# Patient Record
Sex: Male | Born: 1970 | Race: White | Hispanic: No | Marital: Single | Smoking: Current every day smoker
Health system: Southern US, Community
[De-identification: ages and names within clinical notes are randomized; demographics above are authoritative.]

## PROBLEM LIST (undated history)

## (undated) DIAGNOSIS — I10 Essential (primary) hypertension: Secondary | ICD-10-CM

## (undated) DIAGNOSIS — K219 Gastro-esophageal reflux disease without esophagitis: Secondary | ICD-10-CM

## (undated) DIAGNOSIS — R200 Anesthesia of skin: Secondary | ICD-10-CM

## (undated) DIAGNOSIS — E785 Hyperlipidemia, unspecified: Secondary | ICD-10-CM

## (undated) DIAGNOSIS — E119 Type 2 diabetes mellitus without complications: Secondary | ICD-10-CM

## (undated) DIAGNOSIS — R519 Headache, unspecified: Secondary | ICD-10-CM

## (undated) DIAGNOSIS — E079 Disorder of thyroid, unspecified: Secondary | ICD-10-CM

## (undated) DIAGNOSIS — R51 Headache: Secondary | ICD-10-CM

## (undated) DIAGNOSIS — G4730 Sleep apnea, unspecified: Secondary | ICD-10-CM

## (undated) DIAGNOSIS — F32A Depression, unspecified: Secondary | ICD-10-CM

## (undated) DIAGNOSIS — Z889 Allergy status to unspecified drugs, medicaments and biological substances status: Secondary | ICD-10-CM

## (undated) DIAGNOSIS — F329 Major depressive disorder, single episode, unspecified: Secondary | ICD-10-CM

## (undated) DIAGNOSIS — E039 Hypothyroidism, unspecified: Secondary | ICD-10-CM

## (undated) DIAGNOSIS — F4310 Post-traumatic stress disorder, unspecified: Secondary | ICD-10-CM

## (undated) DIAGNOSIS — F419 Anxiety disorder, unspecified: Secondary | ICD-10-CM

## (undated) DIAGNOSIS — R202 Paresthesia of skin: Secondary | ICD-10-CM

## (undated) DIAGNOSIS — G629 Polyneuropathy, unspecified: Secondary | ICD-10-CM

## (undated) HISTORY — PX: HERNIA REPAIR: SHX51

---

## 2016-10-12 ENCOUNTER — Encounter (HOSPITAL_COMMUNITY): Payer: Self-pay | Admitting: Emergency Medicine

## 2016-10-12 ENCOUNTER — Emergency Department (HOSPITAL_COMMUNITY)
Admission: EM | Admit: 2016-10-12 | Discharge: 2016-10-12 | Disposition: A | Discharge status: Home / Self Care | Payer: Self-pay | Attending: Emergency Medicine | Admitting: Emergency Medicine

## 2016-10-12 ENCOUNTER — Emergency Department (HOSPITAL_COMMUNITY)
Admission: EM | Admit: 2016-10-12 | Discharge: 2016-10-12 | Disposition: A | Discharge status: Other Acute Inpatient Hospital | Payer: Self-pay

## 2016-10-12 DIAGNOSIS — Z87891 Personal history of nicotine dependence: Secondary | ICD-10-CM | POA: Insufficient documentation

## 2016-10-12 DIAGNOSIS — I10 Essential (primary) hypertension: Secondary | ICD-10-CM | POA: Insufficient documentation

## 2016-10-12 DIAGNOSIS — Z0000 Encounter for general adult medical examination without abnormal findings: Secondary | ICD-10-CM | POA: Insufficient documentation

## 2016-10-12 DIAGNOSIS — E119 Type 2 diabetes mellitus without complications: Secondary | ICD-10-CM | POA: Insufficient documentation

## 2016-10-12 HISTORY — DX: Type 2 diabetes mellitus without complications: E11.9

## 2016-10-12 HISTORY — DX: Disorder of thyroid, unspecified: E07.9

## 2016-10-12 HISTORY — DX: Essential (primary) hypertension: I10

## 2016-10-12 HISTORY — DX: Hyperlipidemia, unspecified: E78.5

## 2016-10-12 HISTORY — DX: Sleep apnea, unspecified: G47.30

## 2016-10-12 NOTE — ED Triage Notes (Addendum)
Pt c/o numbness and pain from his electronic monitoring device. Device placed Thursday, pt states it's too tight and cutting off circulation. Pt states he was sent by his halfway house for assessment.

## 2016-10-12 NOTE — ED Notes (Signed)
Pt. Telling RN to take BP cuff off because he is leaving. EDP made aware. Pt. Up for d/c.

## 2016-10-12 NOTE — ED Provider Notes (Signed)
[facility] DEPT Provider Note   CSN: [id number] Arrival date & time: 10/12/16  1517     History   Chief Complaint Chief Complaint  Patient presents with  . Numbness    HPI  Blood pressure 134/89, pulse 97, temperature 98.4 F (36.9 C), temperature source Oral, resp. rate 16, height '5\' 9"'  (1.753 m), weight 86.2 kg (190 lb), SpO2 97 %.  Corey Li is a 46 y.o. male complaining ofWith past medical history significant for diabetes, hypertension and hyperlipidemia complaining of discomfort from law enforcement tracking band which is placed to left wrist. He states that he tracking then was actually met her ankle but since he's a diabetic they put it on the wrist. He states it is very uncomfortable when he is walking and when he sleeps at night and he feels that it is cutting off his circulation. He feels numbness in the hand. He states that he staying at a halfway house and he states that he was promised a Magazine features editor would be over to evaluate the band today having, and he is requesting that we either call 911 or cut the band off immediately.   Past Medical History:  Diagnosis Date  . Diabetes mellitus without complication ([place])   . Hyperlipidemia   . Hypertension   . Sleep apnea   . Thyroid disease     There are no active problems to display for this patient.   History reviewed. No pertinent surgical history.     Home Medications    Prior to Admission medications   Not on File    Family History No family history on file.  Social History Social History  Substance Use Topics  . Smoking status: Former Research scientist (life sciences)  . Smokeless tobacco: Never Used  . Alcohol use Yes     Allergies   Patient has no known allergies.   Review of Systems Review of Systems  A complete review of systems was obtained and all systems are negative except as noted in the HPI and PMH.    Physical Exam Updated Vital Signs BP 134/89 (BP Location: Right Arm)   Pulse 97    Temp 98.4 F (36.9 C) (Oral)   Resp 16   Ht '5\' 9"'  (1.753 m)   Wt 86.2 kg (190 lb)   SpO2 97%   BMI 28.06 kg/m   Physical Exam  Constitutional: He is oriented to person, place, and time. He appears well-developed and well-nourished. No distress.  HENT:  Head: Normocephalic and atraumatic.  Mouth/Throat: Oropharynx is clear and moist.  Eyes: Conjunctivae and EOM are normal. Pupils are equal, round, and reactive to light.  Neck: Normal range of motion.  Cardiovascular: Normal rate, regular rhythm and intact distal pulses.   Pulmonary/Chest: Effort normal and breath sounds normal.  Abdominal: Soft. There is no tenderness.  Musculoskeletal: Normal range of motion. He exhibits no edema, tenderness or deformity.  Electronic monitoring band with approximately 3 cm of clearance. Radial pulses 2+, there is no swelling, distal sensation is intact. Skin is intact with no breakdown underneath the wrist band.  Neurological: He is alert and oriented to person, place, and time.  Skin: He is not diaphoretic.  Psychiatric: He has a normal mood and affect.  Nursing note and vitals reviewed.          ED Treatments / Results  Labs (all labs ordered are listed, but only abnormal results are displayed) Labs Reviewed - No data to display  EKG  EKG Interpretation None  Radiology No results found.  Procedures Procedures (including critical care time)  Medications Ordered in ED Medications - No data to display   Initial Impression / Assessment and Plan / ED Course  I have reviewed the triage vital signs and the nursing notes.  Pertinent labs & imaging results that were available during my care of the patient were reviewed by me and considered in my medical decision making (see chart for details).     Vitals:   10/12/16 1525 10/12/16 1529  BP:  134/89  Pulse:  97  Resp:  16  Temp:  98.4 F (36.9 C)  TempSrc:  Oral  SpO2:  97%  Weight: 86.2 kg (190 lb)   Height: '5\' 9"'   (1.753 m)     Medications - No data to display  Corey Li is 46 y.o. male presenting with discomfort to electronic monitoring band. No signs constriction to venous or arterial flow. There is no skin breakdown full range of motion to fingers and wrist. Patient is requesting we cut it off or call 911 immediately, patient discharged in great condition.  Evaluation does not show pathology that would require ongoing emergent intervention or inpatient treatment. Pt is hemodynamically stable and mentating appropriately. Discussed findings and plan with patient/guardian, who agrees with care plan. All questions answered. Return precautions discussed and outpatient follow up given.      Final Clinical Impressions(s) / ED Diagnoses   Final diagnoses:  None    New Prescriptions New Prescriptions   No medications on file     Waynetta Pean 10/12/16 1613    Carmin Muskrat, MD 10/12/16 1700

## 2017-12-29 ENCOUNTER — Other Ambulatory Visit (HOSPITAL_BASED_OUTPATIENT_CLINIC_OR_DEPARTMENT_OTHER): Payer: Self-pay

## 2017-12-29 DIAGNOSIS — F515 Nightmare disorder: Secondary | ICD-10-CM

## 2017-12-29 DIAGNOSIS — R454 Irritability and anger: Secondary | ICD-10-CM

## 2017-12-29 DIAGNOSIS — G2581 Restless legs syndrome: Secondary | ICD-10-CM

## 2017-12-29 DIAGNOSIS — R0683 Snoring: Secondary | ICD-10-CM

## 2017-12-29 DIAGNOSIS — G4710 Hypersomnia, unspecified: Secondary | ICD-10-CM

## 2017-12-29 DIAGNOSIS — G4730 Sleep apnea, unspecified: Secondary | ICD-10-CM

## 2017-12-29 DIAGNOSIS — G4700 Insomnia, unspecified: Secondary | ICD-10-CM

## 2018-01-25 ENCOUNTER — Ambulatory Visit (HOSPITAL_BASED_OUTPATIENT_CLINIC_OR_DEPARTMENT_OTHER): Payer: Self-pay | Attending: *Deleted | Admitting: Internal Medicine

## 2018-01-25 VITALS — Ht 69.0 in | Wt 200.0 lb

## 2018-01-25 DIAGNOSIS — R0902 Hypoxemia: Secondary | ICD-10-CM | POA: Insufficient documentation

## 2018-01-25 DIAGNOSIS — G4733 Obstructive sleep apnea (adult) (pediatric): Secondary | ICD-10-CM | POA: Insufficient documentation

## 2018-01-25 DIAGNOSIS — G4700 Insomnia, unspecified: Secondary | ICD-10-CM

## 2018-01-25 DIAGNOSIS — R454 Irritability and anger: Secondary | ICD-10-CM

## 2018-01-25 DIAGNOSIS — G2581 Restless legs syndrome: Secondary | ICD-10-CM

## 2018-01-25 DIAGNOSIS — G4710 Hypersomnia, unspecified: Secondary | ICD-10-CM

## 2018-01-25 DIAGNOSIS — G4730 Sleep apnea, unspecified: Secondary | ICD-10-CM

## 2018-01-25 DIAGNOSIS — F515 Nightmare disorder: Secondary | ICD-10-CM

## 2018-01-25 DIAGNOSIS — R0683 Snoring: Secondary | ICD-10-CM

## 2018-02-07 DIAGNOSIS — G4710 Hypersomnia, unspecified: Secondary | ICD-10-CM

## 2018-02-07 NOTE — Procedures (Signed)
Patient Name: Corey Li, Corey Li Date: 01/25/2018 Gender: Male D.O.B: 02/12/1971 Age (years): 47 Referring Provider: Marliss Coots NP Height (inches): 69 Interpreting Physician: Baird Lyons MD, ABSM Weight (lbs): 200 RPSGT: Carolin Coy BMI: 30 MRN: [id number] Neck Size: 19.00  CLINICAL INFORMATION The patient is referred for a split night study with BPAP. MEDICATIONS Medications self-administered by patient taken the night of the study : none reported  SLEEP STUDY TECHNIQUE As per the AASM Manual for the Scoring of Sleep and Associated Events v2.3 (April 2016) with a hypopnea requiring 4% desaturations.  The channels recorded and monitored were frontal, central and occipital EEG, electrooculogram (EOG), submentalis EMG (chin), nasal and oral airflow, thoracic and abdominal wall motion, anterior tibialis EMG, snore microphone, electrocardiogram, and pulse oximetry. Bi-level positive airway pressure (BiPAP) was initiated when the patient met split night criteria and was titrated according to treat sleep-disordered breathing.  RESPIRATORY PARAMETERS Diagnostic  Total AHI (/hr): 60.4 RDI (/hr): 60.4 OA Index (/hr): 42.8 CA Index (/hr): 0.4 REM AHI (/hr): 13.3 NREM AHI (/hr): 63.5 Supine AHI (/hr): N/A Non-supine AHI (/hr): 60.38 Min O2 Sat (%): 51.0 Mean O2 (%): 88.5 Time below 88% (min): 59.9   Titration  Optimal IPAP Pressure (cm): 28 Optimal EPAP Pressure (cm): 23 AHI at Optimal Pressure (/hr): 32.3 Min O2 at Optimal Pressure (%): 76.0 Sleep % at Optimal (%): 76 Supine % at Optimal (%): 100   SLEEP ARCHITECTURE The study was initiated at 10:16:01 PM and terminated at 5:12:17 AM. The total recorded time was 416.3 minutes. EEG confirmed total sleep time was 331.5 minutes yielding a sleep efficiency of 79.6%%. Sleep onset after lights out was 10.8 minutes with a REM latency of 52.0 minutes. The patient spent 54.9%% of the night in stage N1 sleep, 30.8%% in stage N2 sleep,  0.0%% in stage N3 and 14.3% in REM. Wake after sleep onset (WASO) was 74.0 minutes. The Arousal Index was 48.7/hour.  LEG MOVEMENT DATA The total Periodic Limb Movements of Sleep (PLMS) were 0. The PLMS index was 0.0 . CARDIAC DATA The 2 lead EKG demonstrated sinus rhythm. The mean heart rate was 100.0 beats per minute. Other EKG findings include: PACs.  IMPRESSIONS - Severe obstructive sleep apnea occurred during the diagnostic portion of the study (AHI = 60.4 /hour).  - CPAP could not provide adequate control and titration was changed to BIPAP with final titration in available time to 28/23. Incomplete control was achieved and oxygen desaturation persisted at final presssures.  - No significant central sleep apnea occurred during the diagnostic portion of the study (CAI = 0.4/hour). - Severe oxygen desaturation was noted during the diagnostic portion of the study (Min O2 = 51.0%). Mean saturation at BIPAP 28/23 was 93.3%, but there was still desaturation at this pressure to a andir of 76%. - The patient snored with loud snoring volume during the diagnostic portion of the study. - EKG findings include PACs. - Clinically significant periodic limb movements of sleep did not occur during the study.  DIAGNOSIS - Obstructive Sleep Apnea (327.23 [G47.33 ICD-10]) - Nocturnal Hypoxemia (327.26 [G47.36 ICD-10])  RECOMMENDATIONS - Trial of BiPAP therapy on 28/23 cm H2O. Patient used a Large size Resmed Full Face Mask Mirage Quattro mask and heated humidification. - Persistent apnea events at this high pressure suggest there may be anatomic upper airway obstruction. Consider ENT evaluation. - Consider supplemental O2 during sleep for Nocturnal Hypoxemia not resolved by therapeutic BIPAP.  - Avoid alcohol, sedatives and other CNS depressants  that may worsen sleep apnea and disrupt normal sleep architecture. - Sleep hygiene should be reviewed to assess factors that may improve sleep quality. - Weight  management and regular exercise should be initiated or continued.  [Electronically signed] 02/07/2018 11:56 AM  Baird Lyons MD, ABSM Diplomate, American Board of Sleep Medicine   NPI: [id number]                          [place] [place] [facility]  ELECTRONICALLY SIGNED ON:  02/07/2018, 11:56 AM [address] PH: [phone]   FX: [phone] [place]

## 2018-05-07 ENCOUNTER — Emergency Department (HOSPITAL_COMMUNITY): Payer: Worker's Compensation

## 2018-05-07 ENCOUNTER — Encounter (HOSPITAL_COMMUNITY): Payer: Self-pay | Admitting: *Deleted

## 2018-05-07 ENCOUNTER — Emergency Department (HOSPITAL_COMMUNITY)
Admission: EM | Admit: 2018-05-07 | Discharge: 2018-05-07 | Disposition: A | Discharge status: Home / Self Care | Payer: Worker's Compensation | Attending: Emergency Medicine | Admitting: Emergency Medicine

## 2018-05-07 DIAGNOSIS — S060X0A Concussion without loss of consciousness, initial encounter: Secondary | ICD-10-CM | POA: Insufficient documentation

## 2018-05-07 DIAGNOSIS — Y990 Civilian activity done for income or pay: Secondary | ICD-10-CM | POA: Insufficient documentation

## 2018-05-07 DIAGNOSIS — I10 Essential (primary) hypertension: Secondary | ICD-10-CM | POA: Insufficient documentation

## 2018-05-07 DIAGNOSIS — E119 Type 2 diabetes mellitus without complications: Secondary | ICD-10-CM | POA: Insufficient documentation

## 2018-05-07 DIAGNOSIS — Y9389 Activity, other specified: Secondary | ICD-10-CM | POA: Diagnosis not present

## 2018-05-07 DIAGNOSIS — R51 Headache: Secondary | ICD-10-CM | POA: Diagnosis not present

## 2018-05-07 DIAGNOSIS — M542 Cervicalgia: Secondary | ICD-10-CM | POA: Insufficient documentation

## 2018-05-07 DIAGNOSIS — S0181XA Laceration without foreign body of other part of head, initial encounter: Secondary | ICD-10-CM | POA: Diagnosis present

## 2018-05-07 DIAGNOSIS — Z87891 Personal history of nicotine dependence: Secondary | ICD-10-CM | POA: Insufficient documentation

## 2018-05-07 DIAGNOSIS — M545 Low back pain: Secondary | ICD-10-CM | POA: Diagnosis not present

## 2018-05-07 DIAGNOSIS — Y9289 Other specified places as the place of occurrence of the external cause: Secondary | ICD-10-CM | POA: Insufficient documentation

## 2018-05-07 DIAGNOSIS — Z23 Encounter for immunization: Secondary | ICD-10-CM | POA: Diagnosis not present

## 2018-05-07 DIAGNOSIS — E079 Disorder of thyroid, unspecified: Secondary | ICD-10-CM | POA: Diagnosis not present

## 2018-05-07 DIAGNOSIS — W208XXA Other cause of strike by thrown, projected or falling object, initial encounter: Secondary | ICD-10-CM | POA: Insufficient documentation

## 2018-05-07 LAB — CBG MONITORING, ED: Glucose-Capillary: 446 mg/dL — ABNORMAL HIGH (ref 70–99)

## 2018-05-07 MED ORDER — HYDROCODONE-ACETAMINOPHEN 5-325 MG PO TABS
1.0000 | ORAL_TABLET | Freq: Once | ORAL | Status: AC
  Administered 2018-05-07: 1 via ORAL
  Filled 2018-05-07: qty 1

## 2018-05-07 MED ORDER — DIAZEPAM 5 MG PO TABS: 5.0000 mg | ORAL_TABLET | Freq: Two times a day (BID) | ORAL | 0 refills | Status: AC

## 2018-05-07 MED ORDER — NAPROXEN 500 MG PO TABS: 500.0000 mg | ORAL_TABLET | Freq: Two times a day (BID) | ORAL | 0 refills | Status: AC

## 2018-05-07 MED ORDER — TETANUS-DIPHTH-ACELL PERTUSSIS 5-2.5-18.5 LF-MCG/0.5 IM SUSP
0.5000 mL | Freq: Once | INTRAMUSCULAR | Status: AC
  Administered 2018-05-07: 0.5 mL via INTRAMUSCULAR
  Filled 2018-05-07: qty 0.5

## 2018-05-07 NOTE — ED Provider Notes (Signed)
[facility] EMERGENCY DEPARTMENT Provider Note   CSN: [id number] Arrival date & time: 05/07/18  1735     History   Chief Complaint Chief Complaint  Patient presents with  . Head Laceration    HPI Corey Li is a 48 y.o. male.  Pt presents to the ED with a headache, forehead laceration, neck and low back.  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  A large plastic pipe was about 20 feet above him, it was loose and fell down onto pt's head.       Past Medical History:  Diagnosis Date  . Diabetes mellitus without complication (H[facility])   . Hyperlipidemia   . Hypertension   . Sleep apnea   . Thyroid disease     There are no active problems to display for this patient.   History reviewed. No pertinent surgical history.      Home Medications    Prior to Admission medications   Not on File    Family History No family history on file.  Social History Social History   Tobacco Use  . Smoking status: Former Games developer  . Smokeless tobacco: Never Used  Substance Use Topics  . Alcohol use: Yes  . Drug use: No     Allergies   Patient has no known allergies.   Review of Systems Review of Systems  Musculoskeletal: Positive for back pain and neck pain.  Skin: Positive for wound.  Neurological: Positive for headaches.  All other systems reviewed and are negative.    Physical Exam Updated Vital Signs BP (!) 178/100 (BP Location: Right Arm)   Pulse 67   Temp 98.2 F (36.8 C) (Oral)   Resp 20   SpO2 98%   Physical Exam Vitals signs and nursing note reviewed.  Constitutional:      Appearance: Normal appearance.  HENT:     Head:      Right Ear: External ear normal.     Left Ear: External ear normal.     Nose: Nose normal.     Mouth/Throat:     Mouth: Mucous membranes are moist.     Pharynx: Oropharynx is clear.  Eyes:     Extraocular Movements: Extraocular movements intact.     Pupils: Pupils are equal,  round, and reactive to light.  Neck:   Cardiovascular:     Rate and Rhythm: Normal rate and regular rhythm.     Pulses: Normal pulses.     Heart sounds: Normal heart sounds.  Pulmonary:     Effort: Pulmonary effort is normal.     Breath sounds: Normal breath sounds.  Abdominal:     General: Abdomen is flat. Bowel sounds are normal.     Palpations: Abdomen is soft.  Musculoskeletal:       Arms:  Skin:    Capillary Refill: Capillary refill takes less than 2 seconds.  Neurological:     General: No focal deficit present.     Mental Status: He is alert and oriented to person, place, and time.  Psychiatric:        Mood and Affect: Mood normal.        Behavior: Behavior normal.        Thought Content: Thought content normal.        Judgment: Judgment normal.      ED Treatments / Results  Labs (all labs ordered are listed, but only abnormal results are displayed) Labs Reviewed - No data  to display  EKG None  Radiology No results found.  Procedures .Marland KitchenLaceration Repair Date/Time: 05/07/2018 6:44 PM Performed by: Jacalyn Lefevre, MD Authorized by: Jacalyn Lefevre, MD   Consent:    Consent obtained:  Verbal   Consent given by:  Patient   Risks discussed:  Need for additional repair and poor cosmetic result   Alternatives discussed:  No treatment Anesthesia (see MAR for exact dosages):    Anesthesia method:  None Laceration details:    Location:  Face   Face location:  Forehead   Length (cm):  4 Repair type:    Repair type:  Simple Treatment:    Area cleansed with:  Saline   Amount of cleaning:  Standard   Irrigation solution:  Sterile saline Skin repair:    Repair method:  Tissue adhesive Approximation:    Approximation:  Close Post-procedure details:    Dressing:  Open (no dressing)   Patient tolerance of procedure:  Tolerated well, no immediate complications   (including critical care time)  Medications Ordered in ED Medications    HYDROcodone-acetaminophen (NORCO/VICODIN) 5-325 MG per tablet 1 tablet (1 tablet Oral Given 05/07/18 1757)  Tdap (BOOSTRIX) injection 0.5 mL (0.5 mLs Intramuscular Given 05/07/18 1758)     Initial Impression / Assessment and Plan / ED Course  I have reviewed the triage vital signs and the nursing notes.  Pertinent labs & imaging results that were available during my care of the patient were reviewed by me and considered in my medical decision making (see chart for details).     Pt's CT scans and Xrays are pending.  Pt is signed out to PA Soto at shift change.  Final Clinical Impressions(s) / ED Diagnoses   Final diagnoses:  Laceration of forehead, initial encounter  Concussion without loss of consciousness, initial encounter    ED Discharge Orders    None       Jacalyn Lefevre, MD 05/08/18 1101

## 2018-05-07 NOTE — ED Notes (Signed)
ED Provider at bedside applying dermabond 

## 2018-05-07 NOTE — ED Notes (Signed)
CBG: 446 °

## 2018-05-07 NOTE — ED Triage Notes (Signed)
Pt reports a large piece of plastic hit his head approx 2 hours ago. Laceration to the left forehead. Reports "seeing black" states that he had some blurred vision as well.

## 2018-05-07 NOTE — ED Provider Notes (Addendum)
  Physical Exam  BP (!) 178/100 (BP Location: Right Arm)   Pulse 67   Temp 98.2 F (36.8 C) (Oral)   Resp 20   SpO2 98%   Physical Exam  ED Course/Procedures     Procedures  MDM  Care received from Dr. Shirlee Limerick at sign out, see her note for full HPI.  Briefly patient with head laceration along with blurred vision pending CT head and neck.  7:47 PM CT head and neck were negative for any acute abnormality, hemorrhage, other acute process.  At this time will reassess patient prior to discharge with Valium to help with his symptoms.  Informed of his results on CT scan, will send home with Valium along with naproxen to help with his symptoms.  Ports understanding and agrees with management.  Return precautions provided.   9:32 PM reported he was still confused, we provided him with water along with food his blood sugar was checked at 446, denies any dizziness but reports he still feeling very confused.  At this time after monitor him for 2 hours after plan discharge patient is ambulatory in hallway asking for discharge.  Will provide him with papers for discharge.  He is trying to have staff call him a cab, case management consulted.     Claude Manges, PA-C 05/07/18 1953    Claude Manges, PA-C 05/07/18 2133    Jacalyn Lefevre, MD 05/08/18 1105

## 2018-05-07 NOTE — Discharge Instructions (Addendum)
Please have your sutures removed within 7 days your primary care physician or return to the ED.  I have provided medication to help with your symptoms please take as directed.  Follow-up with your primary care physician is encouraged within 1 week.

## 2018-05-07 NOTE — ED Notes (Signed)
Provided taxi voucher to get patient safely to pharmacy and home.

## 2018-05-07 NOTE — Progress Notes (Signed)
CSW received a call from pt's RN that pt needs a taxi voucher.  CSW is not on [facility] campus and cannot provide one and suggested seeking a voucher if needed from the [facility].  CSW will continue to follow for D/C needs.  Dorothe Pea. Zuma Hust, LCSW, LCAS, CSI Clinical Social Worker Ph: [phone]

## 2018-06-09 ENCOUNTER — Encounter (HOSPITAL_COMMUNITY): Payer: Self-pay | Admitting: *Deleted

## 2018-06-09 ENCOUNTER — Emergency Department (HOSPITAL_COMMUNITY)
Admission: EM | Admit: 2018-06-09 | Discharge: 2018-06-09 | Disposition: A | Discharge status: Home / Self Care | Payer: No Typology Code available for payment source | Attending: Emergency Medicine | Admitting: Emergency Medicine

## 2018-06-09 ENCOUNTER — Emergency Department (HOSPITAL_COMMUNITY)
Admission: EM | Admit: 2018-06-09 | Discharge: 2018-06-09 | Discharge status: Against Medical Advice | Payer: No Typology Code available for payment source | Source: Home / Self Care

## 2018-06-09 ENCOUNTER — Emergency Department (HOSPITAL_COMMUNITY): Payer: No Typology Code available for payment source

## 2018-06-09 ENCOUNTER — Other Ambulatory Visit: Payer: Self-pay

## 2018-06-09 DIAGNOSIS — Y9301 Activity, walking, marching and hiking: Secondary | ICD-10-CM | POA: Diagnosis not present

## 2018-06-09 DIAGNOSIS — Y999 Unspecified external cause status: Secondary | ICD-10-CM | POA: Insufficient documentation

## 2018-06-09 DIAGNOSIS — W458XXA Other foreign body or object entering through skin, initial encounter: Secondary | ICD-10-CM | POA: Insufficient documentation

## 2018-06-09 DIAGNOSIS — R208 Other disturbances of skin sensation: Secondary | ICD-10-CM | POA: Insufficient documentation

## 2018-06-09 DIAGNOSIS — Z79899 Other long term (current) drug therapy: Secondary | ICD-10-CM | POA: Insufficient documentation

## 2018-06-09 DIAGNOSIS — I10 Essential (primary) hypertension: Secondary | ICD-10-CM | POA: Diagnosis not present

## 2018-06-09 DIAGNOSIS — Z794 Long term (current) use of insulin: Secondary | ICD-10-CM | POA: Diagnosis not present

## 2018-06-09 DIAGNOSIS — S90851A Superficial foreign body, right foot, initial encounter: Secondary | ICD-10-CM | POA: Diagnosis not present

## 2018-06-09 DIAGNOSIS — E119 Type 2 diabetes mellitus without complications: Secondary | ICD-10-CM | POA: Diagnosis not present

## 2018-06-09 DIAGNOSIS — Z87891 Personal history of nicotine dependence: Secondary | ICD-10-CM | POA: Diagnosis not present

## 2018-06-09 DIAGNOSIS — Y929 Unspecified place or not applicable: Secondary | ICD-10-CM | POA: Diagnosis not present

## 2018-06-09 NOTE — ED Notes (Signed)
Discharge instructions discussed with Pt. Pt verbalized understanding. Pt stable and ambulatory.    

## 2018-06-09 NOTE — ED Notes (Signed)
Patient transported to x-ray. ?

## 2018-06-09 NOTE — ED Notes (Signed)
Patient refused to accept offered wheelchair upon discharge (see note - refused to sign or have repeat VS done) and demanded to see MD again because he "wasn't going to leave until whatever was in his foot was taken out" - MD went in to speak with patient and less than 30 seconds after MD left room, patient heard yelling "help" - upon staff entering room, patient found to be sitting on floor. Patient told that he was offered wheelchair, was being discharged, and had already spoken with MD regarding this. No obvious injuries noted, patient in no apparent distress, but still demanding for his foot to be looked at again. MD and EDP state patient may be discharged and instructed to take OTC meclizine d/t new complaint (to EDP) of "feeling off balance" - assisted to ED lobby in wheelchair where he states he is just going to check in again. Charge RN and MD aware.

## 2018-06-09 NOTE — ED Notes (Signed)
Pt declining d/c VS and to sign the signature pad.

## 2018-06-09 NOTE — ED Notes (Signed)
Patient ambulatory to room with steady gait.

## 2018-06-09 NOTE — Discharge Instructions (Addendum)
No evidence of retained foreign body on x-ray.  Return for worsening pain, redness, warmth of the bottom of the foot or for any concerns.

## 2018-06-09 NOTE — ED Triage Notes (Signed)
PT stepped on a needle at home . Pt reports the needle broke off in his Rt heel.

## 2018-06-09 NOTE — ED Provider Notes (Signed)
[facility] EMERGENCY DEPARTMENT Provider Note   CSN: [id number] Arrival date & time: 06/09/18  1359    History   Chief Complaint Chief Complaint  Patient presents with  . Foot Pain    HPI Corey Li is a 48 y.o. male.     HPI Patient is concerned he may have stepped on a sewing needle yesterday.  States he was walking barefoot in the carpet and thinks he sustained a puncture to the right foot.  States he saw a needle that was broken in the carpet.  He is concerned he may have retained a portion of the needle in his foot.  States he has some discomfort around the plantar surface just anterior to the heel.  No swelling or redness. Past Medical History:  Diagnosis Date  . Diabetes mellitus without complication (HCC)   . Hyperlipidemia   . Hypertension   . Sleep apnea   . Thyroid disease     There are no active problems to display for this patient.   History reviewed. No pertinent surgical history.      Home Medications    Prior to Admission medications   Medication Sig Start Date End Date Taking? Authorizing Provider  ATORVASTATIN CALCIUM PO Take 1 tablet by mouth daily.    [provider]  FLUoxetine HCl (PROZAC PO) Take 1 tablet by mouth daily.    [provider]  GABAPENTIN, ONCE-DAILY, PO Take 1 tablet by mouth daily.    [provider]  INSULIN NPH, HUMAN,, ISOPHANE, [place] Inject into the skin 2 (two) times daily.    [provider]  insulin regular (NOVOLIN R,HUMULIN R) 100 units/mL injection Inject into the skin 2 (two) times daily.    [provider]  levothyroxine (SYNTHROID, LEVOTHROID) 175 MCG tablet Take 175 mcg by mouth daily before breakfast.    [provider]  metFORMIN (GLUCOPHAGE) 1000 MG tablet Take 1,000 mg by mouth 2 (two) times daily with a meal.    [provider]  OMEPRAZOLE PO Take 1 capsule by mouth daily.    [provider]  PRESCRIPTION MEDICATION Take 1  tablet by mouth daily. High Blood Pressure    [provider]    Family History History reviewed. No pertinent family history.  Social History Social History   Tobacco Use  . Smoking status: Former Games developer  . Smokeless tobacco: Never Used  Substance Use Topics  . Alcohol use: Yes  . Drug use: No     Allergies   Patient has no known allergies.   Review of Systems Review of Systems  Constitutional: Negative for chills and fever.  Musculoskeletal: Negative for arthralgias.  Skin: Negative for rash and wound.  Neurological: Negative for weakness and numbness.  All other systems reviewed and are negative.    Physical Exam Updated Vital Signs BP (!) 161/105 (BP Location: Right Arm)   Pulse 78   Temp 98.4 F (36.9 C) (Oral)   Resp 18   Ht 5\' 9"  (1.753 m)   Wt 86.2 kg   SpO2 98%   BMI 28.06 kg/m   Physical Exam Vitals signs and nursing note reviewed.  Constitutional:      Appearance: Normal appearance. He is well-developed.  HENT:     Head: Normocephalic and atraumatic.  Neck:     Musculoskeletal: Neck supple.  Cardiovascular:     Rate and Rhythm: Normal rate.  Pulmonary:     Effort: Pulmonary effort is normal.  Abdominal:  Palpations: Abdomen is soft.  Musculoskeletal: Normal range of motion.        General: No swelling, tenderness, deformity or signs of injury.     Comments: No obvious wound or foreign body to the plantar surface of the right foot.  Patient has mild tenderness to palpation over the soft tissue just anterior to the calcaneus.  No erythema or warmth.  Skin:    General: Skin is warm and dry.     Findings: No erythema or rash.  Neurological:     General: No focal deficit present.     Mental Status: He is alert and oriented to person, place, and time.  Psychiatric:        Mood and Affect: Mood normal.        Behavior: Behavior normal.      ED Treatments / Results  Labs (all labs ordered are listed, but only abnormal results  are displayed) Labs Reviewed - No data to display  EKG None  Radiology Dg Foot Complete Right  Result Date: 06/09/2018 CLINICAL DATA:  Right foot pain. The patient stepped on a sewing needle 06/06/2018. EXAM: RIGHT FOOT COMPLETE - 3+ VIEW COMPARISON:  None. FINDINGS: There is no evidence of fracture or dislocation. There is no evidence of arthropathy or other focal bone abnormality. Soft tissues are unremarkable. No radiopaque foreign body is identified. IMPRESSION: Negative exam.  No foreign body is seen. Electronically Signed   By: Drusilla Kanner M.D.   On: 06/09/2018 15:23    Procedures Procedures (including critical care time)  Medications Ordered in ED Medications - No data to display   Initial Impression / Assessment and Plan / ED Course  I have reviewed the triage vital signs and the nursing notes.  Pertinent labs & imaging results that were available during my care of the patient were reviewed by me and considered in my medical decision making (see chart for details).        No obvious signs of retained foreign bodies.  Will get x-ray to rule out.  X-ray without signs of foreign body.  Advised to follow-up with podiatry if his symptoms persist.  Strict return precautions given. Final Clinical Impressions(s) / ED Diagnoses   Final diagnoses:  Sensation of foreign body in foot    ED Discharge Orders    None       Loren Racer, MD 06/09/18 [phone]

## 2018-07-08 ENCOUNTER — Other Ambulatory Visit: Payer: Self-pay | Admitting: Orthopedic Surgery

## 2018-07-10 NOTE — Pre-Procedure Instructions (Signed)
Corey Li  07/10/2018      [id number] - [place], [place] - [address] [place] [address] Phone: [phone] Fax: [phone]  [facility] - [place], [place] - [address] [place] [address] Phone: [phone] Fax: [phone]    Your procedure is scheduled on Wed., July 15, 2018 from 11:34AM-2:05PM  [facility]ance [facility] at 9:30AM  Call this number if you have problems the morning of surgery:  [phone]   Remember:  Do not eat or drink after midnight on March 31st    Take these medicines the morning of surgery with A SIP OF WATER: AmLODipine (NORVASC), FLUoxetine (PROZAC), Atorvastatin (LIPITOR), Gabapentin (NEURONTIN),  Levothyroxine (SYNTHROID, LEVOTHROID), and Omeprazole (PRILOSEC)   If needed: HYDROcodone-acetaminophen (NORCO/VICODIN)    As of today, stop taking all Other Aspirin Products, Vitamins, Fish oils, and Herbal medications. Also stop all NSAIDS i.e. Advil, Ibuprofen, Motrin, Aleve, Anaprox, Naproxen, BC, Goody Powders, and all Supplements.   . Do not take MetFORMIN (GLUCOPHAGE) the morning of surgery.  . THE NIGHT BEFORE SURGERY, take _____10______ units of ___NPH Novolin N________insulin.   . THE MORNING OF SURGERY, take ______15_______ units of ____NPH Novolin N______insulin.  How to Manage Your Diabetes Before and After Surgery  Why is it important to control my blood sugar before and after surgery? . Improving blood sugar levels before and after surgery helps healing and can limit problems. . A way of improving blood sugar control is eating a healthy diet by: o  Eating less sugar and carbohydrates o  Increasing activity/exercise o  Talking with your doctor about reaching your blood sugar goals . High blood sugars (greater than 180 mg/dL) can raise your risk of infections and slow your recovery, so you will need to focus on controlling  your diabetes during the weeks before surgery. . Make sure that the doctor who takes care of your diabetes knows about your planned surgery including the date and location.  How do I manage my blood sugar before surgery? . Check your blood sugar at least 4 times a day, starting 2 days before surgery, to make sure that the level is not too high or low. o Check your blood sugar the morning of your surgery when you wake up and every 2 hours until you get to the Short Stay unit. . If your blood sugar is less than 70 mg/dL, you will need to treat for low blood sugar: o Do not take insulin. o Treat a low blood sugar (less than 70 mg/dL) with  cup of clear juice (cranberry or apple), 4 glucose tablets, OR glucose gel. Recheck blood sugar in 15 minutes after treatment (to make sure it is greater than 70 mg/dL). If your blood sugar is not greater than 70 mg/dL on recheck, call [phone] o  for further instructions.                                    . If your CBG is greater than 220 mg/dL, you may take  of your sliding scale (correction) dose Novolin R (1-5 units) of insulin.  . If you are admitted to the hospital after surgery: o Your blood sugar will be checked by the staff and you will probably be given insulin after surgery (instead of oral diabetes medicines) to make sure you have good blood sugar  levels. o The goal for blood sugar control after surgery is 80-180 mg/dL.  Reviewed and Endorsed by [facility] Patient Education Committee, August 2015  Special instructions:   [facility]- Preparing For Surgery  Before surgery, you can play an important role. Because skin is not sterile, your skin needs to be as free of germs as possible. You can reduce the number of germs on your skin by washing with CHG (chlorahexidine gluconate) Soap before surgery.  CHG is an antiseptic cleaner which kills germs and bonds with the skin to continue killing germs even after washing.    Oral Hygiene is also  important to reduce your risk of infection.  Remember - BRUSH YOUR TEETH THE MORNING OF SURGERY WITH YOUR REGULAR TOOTHPASTE  Please do not use if you have an allergy to CHG or antibacterial soaps. If your skin becomes reddened/irritated stop using the CHG.  Do not shave (including legs and underarms) for at least 48 hours prior to first CHG shower. It is OK to shave your face.  Please follow these instructions carefully.   1. Shower the NIGHT BEFORE SURGERY and the MORNING OF SURGERY with CHG.   2. If you chose to wash your hair, wash your hair first as usual with your normal shampoo.  3. After you shampoo, rinse your hair and body thoroughly to remove the shampoo.  4. Use CHG as you would any other liquid soap. You can apply CHG directly to the skin and wash gently with a scrungie or a clean washcloth.   5. Apply the CHG Soap to your body ONLY FROM THE NECK DOWN.  Do not use on open wounds or open sores. Avoid contact with your eyes, ears, mouth and genitals (private parts). Wash Face and genitals (private parts)  with your normal soap.  6. Wash thoroughly, paying special attention to the area where your surgery will be performed.  7. Thoroughly rinse your body with warm water from the neck down.  8. DO NOT shower/wash with your normal soap after using and rinsing off the CHG Soap.  9. Pat yourself dry with a CLEAN TOWEL.  10. Wear CLEAN PAJAMAS to bed the night before surgery, wear comfortable clothes the morning of surgery  11. Place CLEAN SHEETS on your bed the night of your first shower and DO NOT SLEEP WITH PETS.  Day of Surgery:   Do not wear jewelry.  Do not wear lotions, powders, colognes, or deodorant.  Do not shave 48 hours prior to surgery.  Men may shave face.  Do not bring valuables to the hospital.  [facility] is not responsible for any belongings or valuables.  Contacts, dentures or bridgework may not be worn into surgery.  Leave your suitcase in the car.  After  surgery it may be brought to your room.  For patients admitted to the hospital, discharge time will be determined by your treatment team.  Patients discharged the day of surgery will not be allowed to drive home.   Do not apply any deodorants/lotions.  Please wear clean clothes to the hospital/surgery center.   Remember to brush your teeth WITH YOUR REGULAR TOOTHPASTE.  Please read over the following fact sheets that you were given. Pain Booklet, Coughing and Deep Breathing, MRSA Information and Surgical Site Infection Prevention

## 2018-07-13 ENCOUNTER — Encounter (HOSPITAL_COMMUNITY): Payer: Self-pay

## 2018-07-13 ENCOUNTER — Other Ambulatory Visit: Payer: Self-pay | Admitting: Orthopedic Surgery

## 2018-07-13 ENCOUNTER — Other Ambulatory Visit: Payer: Self-pay

## 2018-07-13 ENCOUNTER — Encounter (HOSPITAL_COMMUNITY)
Admission: RE | Admit: 2018-07-13 | Discharge: 2018-07-13 | Disposition: A | Discharge status: Home / Self Care | Payer: Worker's Compensation | Source: Ambulatory Visit | Attending: Orthopedic Surgery | Admitting: Orthopedic Surgery

## 2018-07-13 DIAGNOSIS — Z01812 Encounter for preprocedural laboratory examination: Secondary | ICD-10-CM | POA: Insufficient documentation

## 2018-07-13 HISTORY — DX: Hypothyroidism, unspecified: E03.9

## 2018-07-13 HISTORY — DX: Major depressive disorder, single episode, unspecified: F32.9

## 2018-07-13 HISTORY — DX: Headache: R51

## 2018-07-13 HISTORY — DX: Gastro-esophageal reflux disease without esophagitis: K21.9

## 2018-07-13 HISTORY — DX: Paresthesia of skin: R20.2

## 2018-07-13 HISTORY — DX: Post-traumatic stress disorder, unspecified: F43.10

## 2018-07-13 HISTORY — DX: Anxiety disorder, unspecified: F41.9

## 2018-07-13 HISTORY — DX: Allergy status to unspecified drugs, medicaments and biological substances: Z88.9

## 2018-07-13 HISTORY — DX: Polyneuropathy, unspecified: G62.9

## 2018-07-13 HISTORY — DX: Anesthesia of skin: R20.0

## 2018-07-13 HISTORY — DX: Depression, unspecified: F32.A

## 2018-07-13 HISTORY — DX: Headache, unspecified: R51.9

## 2018-07-13 LAB — CBC
HCT: 37.3 % — ABNORMAL LOW (ref 39.0–52.0)
Hemoglobin: 12.3 g/dL — ABNORMAL LOW (ref 13.0–17.0)
MCH: 30.6 pg (ref 26.0–34.0)
MCHC: 33 g/dL (ref 30.0–36.0)
MCV: 92.8 fL (ref 80.0–100.0)
Platelets: 322 10*3/uL (ref 150–400)
RBC: 4.02 MIL/uL — ABNORMAL LOW (ref 4.22–5.81)
RDW: 15.3 % (ref 11.5–15.5)
WBC: 9 10*3/uL (ref 4.0–10.5)
nRBC: 0 % (ref 0.0–0.2)

## 2018-07-13 LAB — BASIC METABOLIC PANEL
Anion gap: 9 (ref 5–15)
BUN: 10 mg/dL (ref 6–20)
CO2: 31 mmol/L (ref 22–32)
CREATININE: 1.46 mg/dL — AB (ref 0.61–1.24)
Calcium: 9 mg/dL (ref 8.9–10.3)
Chloride: 94 mmol/L — ABNORMAL LOW (ref 98–111)
GFR calc Af Amer: >60 mL/min (ref >60)
GFR calc non Af Amer: 56 mL/min — ABNORMAL LOW (ref >60)
Glucose, Bld: 263 mg/dL — ABNORMAL HIGH (ref 70–99)
Potassium: 3.8 mmol/L (ref 3.5–5.1)
Sodium: 134 mmol/L — ABNORMAL LOW (ref 135–145)

## 2018-07-13 LAB — ABO/RH: ABO/RH(D): O POS

## 2018-07-13 LAB — SURGICAL PCR SCREEN
MRSA, PCR: NEGATIVE
Staphylococcus aureus: NEGATIVE

## 2018-07-13 LAB — TYPE AND SCREEN
ABO/RH(D): O POS
Antibody Screen: NEGATIVE

## 2018-07-13 LAB — HEMOGLOBIN A1C
Hgb A1c MFr Bld: 10.9 % — ABNORMAL HIGH (ref 4.8–5.6)
Mean Plasma Glucose: 266.13 mg/dL

## 2018-07-13 LAB — GLUCOSE, CAPILLARY: GLUCOSE-CAPILLARY: 145 mg/dL — AB (ref 70–99)

## 2018-07-13 NOTE — Anesthesia Preprocedure Evaluation (Addendum)
Anesthesia Evaluation  Patient identified by MRN, date of birth, ID band Patient awake    Reviewed: Allergy & Precautions, NPO status , Patient's Chart, lab work & pertinent test results  Airway Mallampati: II  TM Distance: >3 FB     Dental   Pulmonary Current Smoker,    breath sounds clear to auscultation       Cardiovascular hypertension,  Rhythm:Regular Rate:Normal     Neuro/Psych    GI/Hepatic   Endo/Other  diabetes  Renal/GU      Musculoskeletal   Abdominal   Peds  Hematology   Anesthesia Other Findings   Reproductive/Obstetrics                            Anesthesia Physical Anesthesia Plan  ASA: III  Anesthesia Plan: General   Post-op Pain Management:    Induction: Intravenous  PONV Risk Score and Plan: 1 and Ondansetron, Midazolam and Treatment may vary due to age or medical condition  Airway Management Planned: Oral ETT  Additional Equipment:   Intra-op Plan:   Post-operative Plan: Extubation in OR  Informed Consent: I have reviewed the patients History and Physical, chart, labs and discussed the procedure including the risks, benefits and alternatives for the proposed anesthesia with the patient or authorized representative who has indicated his/her understanding and acceptance.     Dental advisory given  Plan Discussed with:   Anesthesia Plan Comments:      Anesthesia Quick Evaluation

## 2018-07-13 NOTE — Pre-Procedure Instructions (Addendum)
Corey Li  07/13/2018      Your procedure is scheduled on Wed., July 15, 2018   Report to [facility] Entrance "A" at  8:30 AM              Your surgery or procedure is scheduled for 11:30  Call this number if you have problems the morning of surgery: [phone]  This is the number for the Pre- Surgical Desk.    Remember:  Do not eat or drink after midnight on March 31st    Take these medicines the morning of surgery with A SIP OF WATER: AmLODipine (NORVASC),  FLUoxetine (PROZAC), torvastatin (LIPITOR),  Gabapentin (NEURONTIN),  Levothyroxine (SYNTHROID)  Omeprazole (PRILOSEC)   If needed: HYDROcodone-acetaminophen (NORCO/VICODIN)    As of today, stop taking all Other Aspirin Products, Vitamins, Fish oils, and Herbal medications. Also stop all NSAIDS i.e. Advil, Ibuprofen, Motrin, Aleve, Anaprox, Naproxen, BC, Goody Powders, and all Supplements.   . Do not take MetFORMIN (GLUCOPHAGE) the morning of surgery.  . THE NIGHT BEFORE SURGERY, take __10_ units of NPH Novolin Insulin.   . THE MORNING OF SURGERY, take _15_ units of NPH Novolin insulin. - If CBG is greater than 70.  How to Manage Your Diabetes Before and After Surgery  Why is it important to control my blood sugar before and after surgery? . Improving blood sugar levels before and after surgery helps healing and can limit problems. . A way of improving blood sugar control is eating a healthy diet by: o  Eating less sugar and carbohydrates o  Increasing activity/exercise o  Talking with your doctor about reaching your blood sugar goals . High blood sugars (greater than 180 mg/dL) can raise your risk of infections and slow your recovery, so you will need to focus on controlling your diabetes during the weeks before surgery. . Make sure that the doctor who takes care of your diabetes knows about your planned surgery including the date and location.  How do I manage my blood sugar before surgery? . Check  your blood sugar at least 4 times a day, starting 2 days before surgery, to make sure that the level is not too high or low. o Check your blood sugar the morning of your surgery when you wake up and every 2 hours until you get to the Short Stay unit. . If your blood sugar is less than 70 mg/dL, you will need to treat for low blood sugar: o Do not take insulin. o Treat a low blood sugar (less than 70 mg/dL) with  cup of clear juice (cranberry or apple), 4 glucose tablets, OR glucose gel. Recheck blood sugar in 15 minutes after treatment (to make sure it is greater than 70 mg/dL). If your blood sugar is not greater than 70 mg/dL on recheck, call [phone]  for further instructions.                                    . If your CBG is greater than 220 mg/dL, you may take  of your sliding scale (correction) dose Novolin R (1-5 units) of insulin.  . If you are admitted to the hospital after surgery: o Your blood sugar will be checked by the staff and you will probably be given insulin after surgery (instead of oral diabetes medicines) to make sure you have good blood sugar levels. o The goal for blood sugar  control after surgery is 80-180 mg/dL.  Special instructions:   [facility]- Preparing For Surgery  Before surgery, you can play an important role. Because skin is not sterile, your skin needs to be as free of germs as possible. You can reduce the number of germs on your skin by washing with CHG (chlorahexidine gluconate) Soap before surgery.  CHG is an antiseptic cleaner which kills germs and bonds with the skin to continue killing germs even after washing.    Oral Hygiene is also important to reduce your risk of infection.  Remember - BRUSH YOUR TEETH THE MORNING OF SURGERY WITH YOUR REGULAR TOOTHPASTE  Please do not use if you have an allergy to CHG or antibacterial soaps. If your skin becomes reddened/irritated stop using the CHG.  Do not shave (including legs and underarms) for at least  48 hours prior to first CHG shower. It is OK to shave your face.  Please follow these instructions carefully.   1. Shower the NIGHT BEFORE SURGERY and the MORNING OF SURGERY with CHG.   2. If you chose to wash your hair, wash your hair first as usual with your normal shampoo.  3. After you shampoo,wash your face and private area with the soap you use at home, then rinse your hair and body thoroughly to remove the shampoo and soap.  4. Use CHG as you would any other liquid soap. You can apply CHG directly to the skin and wash gently with a scrungie or a clean washcloth.   5. Apply the CHG Soap to your body ONLY FROM THE NECK DOWN.  Do not use on open wounds or open sores. Avoid contact with your eyes, ears, mouth and genitals (private parts).   6. Wash thoroughly, paying special attention to the area where your surgery will be performed.  7. Thoroughly rinse your body with warm water from the neck down.  8. DO NOT shower/wash with your normal soap after using and rinsing off the CHG Soap.  9. Pat yourself dry with a CLEAN TOWEL.  10. Wear CLEAN PAJAMAS to bed the night before surgery, wear comfortable clothes the morning of surgery  11. Place CLEAN SHEETS on your bed the night of your first shower and DO NOT SLEEP WITH PETS.  Day of Surgery: Shower as written above   Do not wear jewelry.  Do not wear lotions, powders, colognes, or deodorant.  Do not shave 48 hours prior to surgery.  Men may shave face.  Do not bring valuables to the hospital.  [facility] is not responsible for any belongings or valuables.  Contacts, dentures or bridgework may not be worn into surgery.  Leave your suitcase in the car.  After surgery it may be brought to your room.  For patients admitted to the hospital, discharge time will be determined by your treatment team.  Patients discharged the day of surgery will not be allowed to drive home.   Do not apply any deodorants/lotions.  Please wear clean  clothes to the hospital/surgery center.   Remember to brush your teeth WITH YOUR REGULAR TOOTHPASTE.  Please read over the following fact sheets that you were given: Pain Booklet, Incentive Spirometry, Surgical Site Infections.

## 2018-07-13 NOTE — Progress Notes (Signed)
PCP - [name] NP, [facility]  Cardiologist - no  Chest x-ray - na  EKG - dos  Stress Test - no  ECHO - no  Cardiac Cath - no   Sleep Study - 12/2017 CPAP - yes  LABS- CBC, BMP, T/S, A1C  ASA-no  HA1C-10.9 Fasting Blood Sugar - 120-200 Checks Blood Sugar __2___ times a day  Anesthesia-A1c - EKG will be done the day of surgery  Pt denies having chest pain, sob, or fever at this time. All instructions explained to the pt, with a verbal understanding of the material. Pt agrees to go over the instructions while at home for a better understanding. The opportunity to ask questions was provided.  Patient denies any S/S of corona 19; patient states that he is staying away from people.  Mr Manchego reports that he has Medical Advance Directives, he does not know where the papers are.  Frederik Schmidt, patient's  emergency contact is POA. Ms Carlynn Purl lives out of [place].

## 2018-07-13 NOTE — Progress Notes (Signed)
I called Mr. Corey Li after PAT appointment and he reported that he did have labs and an EKG,  At [facility] , he thinks with in the last  6 months. I called [employer] and left a message on the operator line and Medical Tech voice mail.  I spoke with patient's case worker for [employer], Corey Li and asked if patient can get transportation back to the hospital tomorrow for an EKG.  Ms Zachery Dauer will arrange transportation after clearing with the adjustor.

## 2018-07-14 ENCOUNTER — Other Ambulatory Visit: Payer: Self-pay

## 2018-07-14 ENCOUNTER — Encounter (HOSPITAL_COMMUNITY)
Admission: RE | Admit: 2018-07-14 | Discharge: 2018-07-14 | Disposition: A | Discharge status: Home / Self Care | Payer: Worker's Compensation | Source: Ambulatory Visit | Attending: Orthopedic Surgery | Admitting: Orthopedic Surgery

## 2018-07-14 DIAGNOSIS — Z01818 Encounter for other preprocedural examination: Secondary | ICD-10-CM | POA: Insufficient documentation

## 2018-07-14 DIAGNOSIS — E118 Type 2 diabetes mellitus with unspecified complications: Secondary | ICD-10-CM

## 2018-07-14 DIAGNOSIS — Z01812 Encounter for preprocedural laboratory examination: Secondary | ICD-10-CM | POA: Diagnosis not present

## 2018-07-14 DIAGNOSIS — I10 Essential (primary) hypertension: Secondary | ICD-10-CM

## 2018-07-14 NOTE — Progress Notes (Addendum)
Epimenio Sarin , Mr [facility] case manager informed me that she has the officer on Mr Springmeyer case.  Ms Zachery Dauer said she will fax me the officer's name and number, in case it has to be removed. I have not received the information, I called and left a message on Ms Nix voice mail.  I spoke to Mee Hives, NP with [facility], she said that the center has moved and that they did not have a secure way to send correspondence, but if we have any questions to call her back.

## 2018-07-15 ENCOUNTER — Encounter (HOSPITAL_COMMUNITY)
Admission: AD | Disposition: A | Discharge status: Home Health | Payer: Self-pay | Source: Home / Self Care | Attending: Orthopedic Surgery

## 2018-07-15 ENCOUNTER — Ambulatory Visit (HOSPITAL_COMMUNITY): Payer: Worker's Compensation | Admitting: Physician Assistant

## 2018-07-15 ENCOUNTER — Inpatient Hospital Stay (HOSPITAL_COMMUNITY)
Admission: AD | Admit: 2018-07-15 | Discharge: 2018-07-18 | DRG: 454 | Disposition: A | Discharge status: Home Health | Payer: Worker's Compensation | Attending: Orthopedic Surgery | Admitting: Orthopedic Surgery

## 2018-07-15 ENCOUNTER — Ambulatory Visit (HOSPITAL_COMMUNITY): Payer: Worker's Compensation

## 2018-07-15 ENCOUNTER — Other Ambulatory Visit: Payer: Self-pay

## 2018-07-15 ENCOUNTER — Encounter (HOSPITAL_COMMUNITY): Payer: Self-pay

## 2018-07-15 DIAGNOSIS — M50123 Cervical disc disorder at C6-C7 level with radiculopathy: Principal | ICD-10-CM | POA: Diagnosis present

## 2018-07-15 DIAGNOSIS — F419 Anxiety disorder, unspecified: Secondary | ICD-10-CM | POA: Diagnosis present

## 2018-07-15 DIAGNOSIS — K219 Gastro-esophageal reflux disease without esophagitis: Secondary | ICD-10-CM | POA: Diagnosis present

## 2018-07-15 DIAGNOSIS — E785 Hyperlipidemia, unspecified: Secondary | ICD-10-CM | POA: Diagnosis present

## 2018-07-15 DIAGNOSIS — I10 Essential (primary) hypertension: Secondary | ICD-10-CM | POA: Diagnosis present

## 2018-07-15 DIAGNOSIS — M25512 Pain in left shoulder: Secondary | ICD-10-CM | POA: Diagnosis present

## 2018-07-15 DIAGNOSIS — Z01818 Encounter for other preprocedural examination: Secondary | ICD-10-CM

## 2018-07-15 DIAGNOSIS — G4730 Sleep apnea, unspecified: Secondary | ICD-10-CM | POA: Diagnosis present

## 2018-07-15 DIAGNOSIS — E039 Hypothyroidism, unspecified: Secondary | ICD-10-CM | POA: Diagnosis present

## 2018-07-15 DIAGNOSIS — Z79890 Hormone replacement therapy (postmenopausal): Secondary | ICD-10-CM

## 2018-07-15 DIAGNOSIS — F4310 Post-traumatic stress disorder, unspecified: Secondary | ICD-10-CM | POA: Diagnosis present

## 2018-07-15 DIAGNOSIS — Z419 Encounter for procedure for purposes other than remedying health state, unspecified: Secondary | ICD-10-CM

## 2018-07-15 DIAGNOSIS — F17210 Nicotine dependence, cigarettes, uncomplicated: Secondary | ICD-10-CM | POA: Diagnosis present

## 2018-07-15 DIAGNOSIS — Z794 Long term (current) use of insulin: Secondary | ICD-10-CM

## 2018-07-15 DIAGNOSIS — E1140 Type 2 diabetes mellitus with diabetic neuropathy, unspecified: Secondary | ICD-10-CM | POA: Diagnosis present

## 2018-07-15 DIAGNOSIS — G959 Disease of spinal cord, unspecified: Secondary | ICD-10-CM | POA: Diagnosis present

## 2018-07-15 DIAGNOSIS — G9520 Unspecified cord compression: Secondary | ICD-10-CM | POA: Diagnosis present

## 2018-07-15 DIAGNOSIS — Z79899 Other long term (current) drug therapy: Secondary | ICD-10-CM

## 2018-07-15 DIAGNOSIS — M5410 Radiculopathy, site unspecified: Secondary | ICD-10-CM | POA: Diagnosis present

## 2018-07-15 HISTORY — PX: ANTERIOR CERVICAL DECOMP/DISCECTOMY FUSION: SHX1161

## 2018-07-15 LAB — URINALYSIS, ROUTINE W REFLEX MICROSCOPIC
Bilirubin Urine: NEGATIVE
Glucose, UA: NEGATIVE mg/dL
Hgb urine dipstick: NEGATIVE
Ketones, ur: NEGATIVE mg/dL
Leukocytes,Ua: NEGATIVE
Nitrite: NEGATIVE
Protein, ur: NEGATIVE mg/dL
Specific Gravity, Urine: 1.003 — ABNORMAL LOW (ref 1.005–1.030)
pH: 7 (ref 5.0–8.0)

## 2018-07-15 LAB — GLUCOSE, CAPILLARY
Glucose-Capillary: 63 mg/dL — ABNORMAL LOW (ref 70–99)
Glucose-Capillary: 88 mg/dL (ref 70–99)
Glucose-Capillary: 66 mg/dL — ABNORMAL LOW (ref 70–99)
Glucose-Capillary: 90 mg/dL (ref 70–99)
Glucose-Capillary: 75 mg/dL (ref 70–99)
Glucose-Capillary: 105 mg/dL — ABNORMAL HIGH (ref 70–99)
Glucose-Capillary: 277 mg/dL — ABNORMAL HIGH (ref 70–99)

## 2018-07-15 SURGERY — ANTERIOR CERVICAL DECOMPRESSION/DISCECTOMY FUSION 2 LEVELS
Anesthesia: General | Site: Neck

## 2018-07-15 MED ORDER — DIAZEPAM 5 MG PO TABS
5.0000 mg | ORAL_TABLET | Freq: Four times a day (QID) | ORAL | Status: DC | PRN
  Administered 2018-07-16 – 2018-07-18 (×6): 5 mg via ORAL
  Filled 2018-07-15 (×6): qty 1

## 2018-07-15 MED ORDER — PROPOFOL 10 MG/ML IV BOLUS
INTRAVENOUS | Status: AC
  Filled 2018-07-15: qty 40

## 2018-07-15 MED ORDER — PROMETHAZINE HCL 25 MG/ML IJ SOLN: 6.2500 mg | INTRAMUSCULAR | Status: DC | PRN

## 2018-07-15 MED ORDER — FENTANYL CITRATE (PF) 100 MCG/2ML IJ SOLN
INTRAMUSCULAR | Status: DC | PRN
  Administered 2018-07-15 (×4): 50 ug via INTRAVENOUS

## 2018-07-15 MED ORDER — ALUM & MAG HYDROXIDE-SIMETH 200-200-20 MG/5ML PO SUSP: 30.0000 mL | Freq: Four times a day (QID) | ORAL | Status: DC | PRN

## 2018-07-15 MED ORDER — PHENYLEPHRINE HCL 10 MG/ML IJ SOLN
INTRAMUSCULAR | Status: DC | PRN
  Administered 2018-07-15: 40 ug via INTRAVENOUS
  Administered 2018-07-15 (×2): 80 ug via INTRAVENOUS
  Administered 2018-07-15: 40 ug via INTRAVENOUS

## 2018-07-15 MED ORDER — SENNOSIDES-DOCUSATE SODIUM 8.6-50 MG PO TABS: 1.0000 | ORAL_TABLET | Freq: Every evening | ORAL | Status: DC | PRN

## 2018-07-15 MED ORDER — CEFAZOLIN SODIUM-DEXTROSE 2-4 GM/100ML-% IV SOLN
2.0000 g | Freq: Three times a day (TID) | INTRAVENOUS | Status: AC
  Administered 2018-07-15 – 2018-07-16 (×2): 2 g via INTRAVENOUS
  Filled 2018-07-15 (×2): qty 100

## 2018-07-15 MED ORDER — LACTATED RINGERS IV SOLN
INTRAVENOUS | Status: DC
  Administered 2018-07-15: 09:00:00 via INTRAVENOUS

## 2018-07-15 MED ORDER — BISACODYL 5 MG PO TBEC: 5.0000 mg | DELAYED_RELEASE_TABLET | Freq: Every day | ORAL | Status: DC | PRN

## 2018-07-15 MED ORDER — THROMBIN (RECOMBINANT) 20000 UNITS EX SOLR
CUTANEOUS | Status: AC
  Filled 2018-07-15: qty 20000

## 2018-07-15 MED ORDER — HEMOSTATIC AGENTS (NO CHARGE) OPTIME
TOPICAL | Status: DC | PRN
  Administered 2018-07-15: 1 via TOPICAL

## 2018-07-15 MED ORDER — SUCCINYLCHOLINE CHLORIDE 200 MG/10ML IV SOSY
PREFILLED_SYRINGE | INTRAVENOUS | Status: AC
  Filled 2018-07-15: qty 10

## 2018-07-15 MED ORDER — INSULIN ASPART 100 UNIT/ML ~~LOC~~ SOLN
0.0000 [IU] | Freq: Every day | SUBCUTANEOUS | Status: DC
  Administered 2018-07-15: 22:00:00 3 [IU] via SUBCUTANEOUS
  Administered 2018-07-16: 5 [IU] via SUBCUTANEOUS

## 2018-07-15 MED ORDER — ROCURONIUM BROMIDE 50 MG/5ML IV SOSY
PREFILLED_SYRINGE | INTRAVENOUS | Status: DC | PRN
  Administered 2018-07-15: 10 mg via INTRAVENOUS
  Administered 2018-07-15: 20 mg via INTRAVENOUS
  Administered 2018-07-15: 50 mg via INTRAVENOUS

## 2018-07-15 MED ORDER — LIDOCAINE 2% (20 MG/ML) 5 ML SYRINGE
INTRAMUSCULAR | Status: DC | PRN
  Administered 2018-07-15: 60 mg via INTRAVENOUS

## 2018-07-15 MED ORDER — INSULIN ASPART 100 UNIT/ML ~~LOC~~ SOLN: 2.0000 [IU] | Freq: Two times a day (BID) | SUBCUTANEOUS | Status: DC

## 2018-07-15 MED ORDER — INSULIN NPH (HUMAN) (ISOPHANE) 100 UNIT/ML ~~LOC~~ SUSP
20.0000 [IU] | Freq: Every day | SUBCUTANEOUS | Status: DC
  Filled 2018-07-15: qty 10

## 2018-07-15 MED ORDER — CEFAZOLIN SODIUM 1 G IJ SOLR
INTRAMUSCULAR | Status: AC
  Filled 2018-07-15: qty 20

## 2018-07-15 MED ORDER — CHLORHEXIDINE GLUCONATE 4 % EX LIQD: 60.0000 mL | Freq: Once | CUTANEOUS | Status: DC

## 2018-07-15 MED ORDER — PHENOL 1.4 % MT LIQD: 1.0000 | OROMUCOSAL | Status: DC | PRN

## 2018-07-15 MED ORDER — INSULIN NPH (HUMAN) (ISOPHANE) 100 UNIT/ML ~~LOC~~ SUSP
20.0000 [IU] | Freq: Every day | SUBCUTANEOUS | Status: DC
  Administered 2018-07-15 – 2018-07-16 (×2): 20 [IU] via SUBCUTANEOUS
  Filled 2018-07-15: qty 10

## 2018-07-15 MED ORDER — PANTOPRAZOLE SODIUM 40 MG PO TBEC
40.0000 mg | DELAYED_RELEASE_TABLET | Freq: Every day | ORAL | Status: DC
  Administered 2018-07-17 – 2018-07-18 (×2): 40 mg via ORAL
  Filled 2018-07-15 (×2): qty 1

## 2018-07-15 MED ORDER — LIDOCAINE 2% (20 MG/ML) 5 ML SYRINGE
INTRAMUSCULAR | Status: AC
  Filled 2018-07-15: qty 5

## 2018-07-15 MED ORDER — INSULIN NPH (HUMAN) (ISOPHANE) 100 UNIT/ML ~~LOC~~ SUSP: 20.0000 [IU] | SUBCUTANEOUS | Status: DC

## 2018-07-15 MED ORDER — SODIUM CHLORIDE 0.9 % IV SOLN
INTRAVENOUS | Status: DC | PRN
  Administered 2018-07-15: 16:00:00 15 ug/min via INTRAVENOUS

## 2018-07-15 MED ORDER — PANTOPRAZOLE SODIUM 40 MG IV SOLR: 40.0000 mg | Freq: Every day | INTRAVENOUS | Status: DC

## 2018-07-15 MED ORDER — EPHEDRINE 5 MG/ML INJ
INTRAVENOUS | Status: AC
  Filled 2018-07-15: qty 10

## 2018-07-15 MED ORDER — OXYCODONE-ACETAMINOPHEN 5-325 MG PO TABS
1.0000 | ORAL_TABLET | ORAL | Status: DC | PRN
  Administered 2018-07-15 – 2018-07-16 (×3): 2 via ORAL
  Filled 2018-07-15 (×3): qty 2

## 2018-07-15 MED ORDER — LEVOTHYROXINE SODIUM 75 MCG PO TABS
175.0000 ug | ORAL_TABLET | Freq: Every day | ORAL | Status: DC
  Administered 2018-07-16 – 2018-07-18 (×3): 175 ug via ORAL
  Filled 2018-07-15 (×3): qty 1

## 2018-07-15 MED ORDER — CEFAZOLIN SODIUM-DEXTROSE 2-4 GM/100ML-% IV SOLN
INTRAVENOUS | Status: AC
  Filled 2018-07-15: qty 100

## 2018-07-15 MED ORDER — INSULIN ASPART 100 UNIT/ML ~~LOC~~ SOLN
0.0000 [IU] | Freq: Three times a day (TID) | SUBCUTANEOUS | Status: DC
  Administered 2018-07-16: 5 [IU] via SUBCUTANEOUS
  Administered 2018-07-16: 11 [IU] via SUBCUTANEOUS
  Administered 2018-07-17: 13:00:00 15 [IU] via SUBCUTANEOUS
  Administered 2018-07-17 (×2): 5 [IU] via SUBCUTANEOUS
  Administered 2018-07-18: 8 [IU] via SUBCUTANEOUS
  Administered 2018-07-18: 10:00:00 2 [IU] via SUBCUTANEOUS

## 2018-07-15 MED ORDER — MIDAZOLAM HCL 5 MG/5ML IJ SOLN
INTRAMUSCULAR | Status: DC | PRN
  Administered 2018-07-15: 2 mg via INTRAVENOUS

## 2018-07-15 MED ORDER — GABAPENTIN 300 MG PO CAPS: 900.0000 mg | ORAL_CAPSULE | ORAL | Status: DC

## 2018-07-15 MED ORDER — BUPIVACAINE-EPINEPHRINE 0.25% -1:200000 IJ SOLN
INTRAMUSCULAR | Status: DC | PRN
  Administered 2018-07-15: 10 mL

## 2018-07-15 MED ORDER — SUGAMMADEX SODIUM 200 MG/2ML IV SOLN
INTRAVENOUS | Status: DC | PRN
  Administered 2018-07-15: 170 mg via INTRAVENOUS

## 2018-07-15 MED ORDER — MENTHOL 3 MG MT LOZG: 1.0000 | LOZENGE | OROMUCOSAL | Status: DC | PRN

## 2018-07-15 MED ORDER — FUROSEMIDE 40 MG PO TABS
40.0000 mg | ORAL_TABLET | Freq: Two times a day (BID) | ORAL | Status: DC
  Administered 2018-07-15 – 2018-07-18 (×4): 40 mg via ORAL
  Filled 2018-07-15 (×4): qty 1

## 2018-07-15 MED ORDER — 0.9 % SODIUM CHLORIDE (POUR BTL) OPTIME
TOPICAL | Status: DC | PRN
  Administered 2018-07-15 (×2): 1000 mL

## 2018-07-15 MED ORDER — THROMBIN 20000 UNITS EX SOLR
CUTANEOUS | Status: DC | PRN
  Administered 2018-07-15: 20000 [IU] via TOPICAL

## 2018-07-15 MED ORDER — FLUOXETINE HCL 10 MG PO CAPS
10.0000 mg | ORAL_CAPSULE | Freq: Every day | ORAL | Status: DC
  Administered 2018-07-16 – 2018-07-18 (×3): 10 mg via ORAL
  Filled 2018-07-15 (×3): qty 1

## 2018-07-15 MED ORDER — LISINOPRIL 20 MG PO TABS
40.0000 mg | ORAL_TABLET | Freq: Every day | ORAL | Status: DC
  Administered 2018-07-16 – 2018-07-18 (×3): 40 mg via ORAL
  Filled 2018-07-15 (×3): qty 2

## 2018-07-15 MED ORDER — INSULIN NPH (HUMAN) (ISOPHANE) 100 UNIT/ML ~~LOC~~ SUSP
30.0000 [IU] | Freq: Every day | SUBCUTANEOUS | Status: DC
  Filled 2018-07-15: qty 10

## 2018-07-15 MED ORDER — ONDANSETRON HCL 4 MG/2ML IJ SOLN
INTRAMUSCULAR | Status: AC
  Filled 2018-07-15: qty 2

## 2018-07-15 MED ORDER — SODIUM CHLORIDE 0.9 % IV SOLN
250.0000 mL | INTRAVENOUS | Status: DC
  Administered 2018-07-15: 19:00:00 1000 mL via INTRAVENOUS

## 2018-07-15 MED ORDER — ACETAMINOPHEN 650 MG RE SUPP: 650.0000 mg | RECTAL | Status: DC | PRN

## 2018-07-15 MED ORDER — FENTANYL CITRATE (PF) 100 MCG/2ML IJ SOLN
INTRAMUSCULAR | Status: AC
  Administered 2018-07-15: 25 ug via INTRAVENOUS
  Filled 2018-07-15: qty 2

## 2018-07-15 MED ORDER — GABAPENTIN 400 MG PO CAPS
1200.0000 mg | ORAL_CAPSULE | Freq: Every evening | ORAL | Status: DC | PRN
  Administered 2018-07-15 – 2018-07-16 (×2): 1200 mg via ORAL
  Filled 2018-07-15 (×2): qty 3

## 2018-07-15 MED ORDER — ONDANSETRON HCL 4 MG/2ML IJ SOLN: 4.0000 mg | Freq: Four times a day (QID) | INTRAMUSCULAR | Status: DC | PRN

## 2018-07-15 MED ORDER — METFORMIN HCL 500 MG PO TABS
1000.0000 mg | ORAL_TABLET | Freq: Two times a day (BID) | ORAL | Status: DC
  Administered 2018-07-16 – 2018-07-18 (×4): 1000 mg via ORAL
  Filled 2018-07-15 (×4): qty 2

## 2018-07-15 MED ORDER — ONDANSETRON HCL 4 MG PO TABS: 4.0000 mg | ORAL_TABLET | Freq: Four times a day (QID) | ORAL | Status: DC | PRN

## 2018-07-15 MED ORDER — ONDANSETRON HCL 4 MG/2ML IJ SOLN
INTRAMUSCULAR | Status: DC | PRN
  Administered 2018-07-15: 4 mg via INTRAVENOUS

## 2018-07-15 MED ORDER — EPHEDRINE SULFATE 50 MG/ML IJ SOLN
INTRAMUSCULAR | Status: DC | PRN
  Administered 2018-07-15 (×2): 10 mg via INTRAVENOUS
  Administered 2018-07-15 (×2): 5 mg via INTRAVENOUS
  Administered 2018-07-15 (×2): 10 mg via INTRAVENOUS

## 2018-07-15 MED ORDER — DEXTROSE 50 % IV SOLN
INTRAVENOUS | Status: AC
  Administered 2018-07-15: 09:00:00 12.5 g via INTRAVENOUS
  Filled 2018-07-15: qty 50

## 2018-07-15 MED ORDER — SODIUM CHLORIDE 0.9 % IV SOLN: 250.0000 mL | INTRAVENOUS | Status: DC

## 2018-07-15 MED ORDER — THROMBIN 20000 UNITS EX SOLR
CUTANEOUS | Status: AC
  Filled 2018-07-15: qty 20000

## 2018-07-15 MED ORDER — ROCURONIUM BROMIDE 50 MG/5ML IV SOSY
PREFILLED_SYRINGE | INTRAVENOUS | Status: AC
  Filled 2018-07-15: qty 5

## 2018-07-15 MED ORDER — SODIUM CHLORIDE 0.9% FLUSH: 3.0000 mL | INTRAVENOUS | Status: DC | PRN

## 2018-07-15 MED ORDER — DEXTROSE 50 % IV SOLN
12.5000 g | INTRAVENOUS | Status: AC
  Administered 2018-07-15: 09:00:00 12.5 g via INTRAVENOUS

## 2018-07-15 MED ORDER — SUCCINYLCHOLINE CHLORIDE 20 MG/ML IJ SOLN
INTRAMUSCULAR | Status: DC | PRN
  Administered 2018-07-15: 100 mg via INTRAVENOUS

## 2018-07-15 MED ORDER — DOCUSATE SODIUM 100 MG PO CAPS
100.0000 mg | ORAL_CAPSULE | Freq: Two times a day (BID) | ORAL | Status: DC
  Administered 2018-07-15 – 2018-07-18 (×3): 100 mg via ORAL
  Filled 2018-07-15 (×4): qty 1

## 2018-07-15 MED ORDER — FENTANYL CITRATE (PF) 250 MCG/5ML IJ SOLN
INTRAMUSCULAR | Status: AC
  Filled 2018-07-15: qty 5

## 2018-07-15 MED ORDER — SODIUM CHLORIDE 0.9% FLUSH
3.0000 mL | Freq: Two times a day (BID) | INTRAVENOUS | Status: DC
  Administered 2018-07-15 – 2018-07-18 (×3): 3 mL via INTRAVENOUS

## 2018-07-15 MED ORDER — BUPIVACAINE-EPINEPHRINE (PF) 0.25% -1:200000 IJ SOLN
INTRAMUSCULAR | Status: AC
  Filled 2018-07-15: qty 30

## 2018-07-15 MED ORDER — PROPOFOL 1000 MG/100ML IV EMUL
INTRAVENOUS | Status: AC
  Filled 2018-07-15: qty 100

## 2018-07-15 MED ORDER — PROPOFOL 10 MG/ML IV BOLUS
INTRAVENOUS | Status: DC | PRN
  Administered 2018-07-15: 30 mg via INTRAVENOUS
  Administered 2018-07-15: 170 mg via INTRAVENOUS
  Administered 2018-07-15: 30 mg via INTRAVENOUS

## 2018-07-15 MED ORDER — GABAPENTIN 300 MG PO CAPS
900.0000 mg | ORAL_CAPSULE | Freq: Two times a day (BID) | ORAL | Status: DC | PRN
  Administered 2018-07-16 – 2018-07-18 (×4): 900 mg via ORAL
  Filled 2018-07-15 (×4): qty 3

## 2018-07-15 MED ORDER — DEXTROSE 50 % IV SOLN
12.5000 g | INTRAVENOUS | Status: AC
  Administered 2018-07-15: 11:00:00 12.5 g via INTRAVENOUS

## 2018-07-15 MED ORDER — ACETAMINOPHEN 325 MG PO TABS: 650.0000 mg | ORAL_TABLET | ORAL | Status: DC | PRN

## 2018-07-15 MED ORDER — CEFAZOLIN SODIUM-DEXTROSE 2-4 GM/100ML-% IV SOLN
2.0000 g | INTRAVENOUS | Status: AC
  Administered 2018-07-15: 2 g via INTRAVENOUS

## 2018-07-15 MED ORDER — DEXAMETHASONE SODIUM PHOSPHATE 10 MG/ML IJ SOLN
INTRAMUSCULAR | Status: AC
  Filled 2018-07-15: qty 1

## 2018-07-15 MED ORDER — PHENYLEPHRINE 40 MCG/ML (10ML) SYRINGE FOR IV PUSH (FOR BLOOD PRESSURE SUPPORT)
PREFILLED_SYRINGE | INTRAVENOUS | Status: AC
  Filled 2018-07-15: qty 10

## 2018-07-15 MED ORDER — FENTANYL CITRATE (PF) 100 MCG/2ML IJ SOLN
25.0000 ug | INTRAMUSCULAR | Status: DC | PRN
  Administered 2018-07-15: 17:00:00 25 ug via INTRAVENOUS

## 2018-07-15 MED ORDER — AMLODIPINE BESYLATE 5 MG PO TABS
5.0000 mg | ORAL_TABLET | Freq: Every day | ORAL | Status: DC
  Administered 2018-07-16 – 2018-07-18 (×3): 5 mg via ORAL
  Filled 2018-07-15 (×3): qty 1

## 2018-07-15 MED ORDER — PROPOFOL 500 MG/50ML IV EMUL
INTRAVENOUS | Status: DC | PRN
  Administered 2018-07-15: 50 ug/kg/min via INTRAVENOUS

## 2018-07-15 MED ORDER — ZOLPIDEM TARTRATE 5 MG PO TABS
5.0000 mg | ORAL_TABLET | Freq: Every evening | ORAL | Status: DC | PRN
  Filled 2018-07-15: qty 1

## 2018-07-15 MED ORDER — ATORVASTATIN CALCIUM 80 MG PO TABS
80.0000 mg | ORAL_TABLET | Freq: Every day | ORAL | Status: DC
  Administered 2018-07-17 – 2018-07-18 (×2): 80 mg via ORAL
  Filled 2018-07-15 (×2): qty 1

## 2018-07-15 MED ORDER — FLEET ENEMA 7-19 GM/118ML RE ENEM: 1.0000 | ENEMA | Freq: Once | RECTAL | Status: DC | PRN

## 2018-07-15 MED ORDER — MIDAZOLAM HCL 2 MG/2ML IJ SOLN
INTRAMUSCULAR | Status: AC
  Filled 2018-07-15: qty 2

## 2018-07-15 MED ORDER — INSULIN NPH (HUMAN) (ISOPHANE) 100 UNIT/ML ~~LOC~~ SUSP
30.0000 [IU] | Freq: Every day | SUBCUTANEOUS | Status: DC
  Administered 2018-07-17: 30 [IU] via SUBCUTANEOUS
  Filled 2018-07-15: qty 10

## 2018-07-15 MED ORDER — STERILE WATER FOR IRRIGATION IR SOLN
Status: DC | PRN
  Administered 2018-07-15: 1000 mL

## 2018-07-15 SURGICAL SUPPLY — 78 items
BENZOIN TINCTURE PRP APPL 2/3 (GAUZE/BANDAGES/DRESSINGS) ×3 IMPLANT
BIT DRILL NEURO 2X3.1 SFT TUCH (MISCELLANEOUS) ×1 IMPLANT
BIT DRILL SRG 14X2.2XFLT CHK (BIT) ×1 IMPLANT
BIT DRL SRG 14X2.2XFLT CHK (BIT) ×1
BLADE CLIPPER SURG (BLADE) ×3 IMPLANT
BLADE SURG 15 STRL LF DISP TIS (BLADE) ×1 IMPLANT
BLADE SURG 15 STRL SS (BLADE) ×2
BONE VIVIGEN FORMABLE 1.3CC (Bone Implant) ×3 IMPLANT
BUR MATCHSTICK NEURO 3.0 LAGG (BURR) IMPLANT
CAGE ANT VBR TI 14X16-25 6D (Cage) ×3 IMPLANT
CARTRIDGE OIL MAESTRO DRILL (MISCELLANEOUS) ×1 IMPLANT
CASSETTE HEAD DISP 14mm cartridge (No Charge per Rep BM) ×3 IMPLANT
CLOSURE STERI-STRIP 1/2X4 (GAUZE/BANDAGES/DRESSINGS) ×1
CLSR STERI-STRIP ANTIMIC 1/2X4 (GAUZE/BANDAGES/DRESSINGS) ×2 IMPLANT
COVER SURGICAL LIGHT HANDLE (MISCELLANEOUS) ×3 IMPLANT
COVER WAND RF STERILE (DRAPES) ×3 IMPLANT
CRADLE DONUT ADULT HEAD (MISCELLANEOUS) ×3 IMPLANT
DECANTER SPIKE VIAL GLASS SM (MISCELLANEOUS) ×3 IMPLANT
DIFFUSER DRILL AIR PNEUMATIC (MISCELLANEOUS) ×3 IMPLANT
DRAIN JACKSON RD 7FR 3/32 (WOUND CARE) IMPLANT
DRAPE C-ARM 42X72 X-RAY (DRAPES) ×3 IMPLANT
DRAPE POUCH INSTRU U-SHP 10X18 (DRAPES) ×3 IMPLANT
DRAPE SURG 17X23 STRL (DRAPES) ×12 IMPLANT
DRILL BIT SKYLINE 14MM (BIT) ×2
DRILL NEURO 2X3.1 SOFT TOUCH (MISCELLANEOUS) ×3
DURAPREP 26ML APPLICATOR (WOUND CARE) ×3 IMPLANT
ELECT COATED BLADE 2.86 ST (ELECTRODE) ×3 IMPLANT
ELECT REM PT RETURN 9FT ADLT (ELECTROSURGICAL) ×3
ELECTRODE REM PT RTRN 9FT ADLT (ELECTROSURGICAL) ×1 IMPLANT
EVACUATOR SILICONE 100CC (DRAIN) IMPLANT
GAUZE 4X4 16PLY RFD (DISPOSABLE) ×3 IMPLANT
GAUZE SPONGE 4X4 12PLY STRL (GAUZE/BANDAGES/DRESSINGS) ×3 IMPLANT
GLOVE BIO SURGEON STRL SZ7 (GLOVE) ×3 IMPLANT
GLOVE BIO SURGEON STRL SZ8 (GLOVE) ×3 IMPLANT
GLOVE BIOGEL PI IND STRL 7.0 (GLOVE) ×2 IMPLANT
GLOVE BIOGEL PI IND STRL 8 (GLOVE) ×1 IMPLANT
GLOVE BIOGEL PI INDICATOR 7.0 (GLOVE) ×4
GLOVE BIOGEL PI INDICATOR 8 (GLOVE) ×2
GOWN STRL REUS W/ TWL LRG LVL3 (GOWN DISPOSABLE) ×1 IMPLANT
GOWN STRL REUS W/ TWL XL LVL3 (GOWN DISPOSABLE) ×1 IMPLANT
GOWN STRL REUS W/TWL LRG LVL3 (GOWN DISPOSABLE) ×2
GOWN STRL REUS W/TWL XL LVL3 (GOWN DISPOSABLE) ×2
IV CATH 14GX2 1/4 (CATHETERS) ×3 IMPLANT
KIT BASIN OR (CUSTOM PROCEDURE TRAY) ×3 IMPLANT
KIT TURNOVER KIT B (KITS) ×3 IMPLANT
MANIFOLD NEPTUNE II (INSTRUMENTS) ×3 IMPLANT
NEEDLE PRECISIONGLIDE 27X1.5 (NEEDLE) ×3 IMPLANT
NEEDLE SPNL 20GX3.5 QUINCKE YW (NEEDLE) ×3 IMPLANT
NS IRRIG 1000ML POUR BTL (IV SOLUTION) ×9 IMPLANT
OIL CARTRIDGE MAESTRO DRILL (MISCELLANEOUS) ×3
PACK ORTHO CERVICAL (CUSTOM PROCEDURE TRAY) ×3 IMPLANT
PAD ARMBOARD 7.5X6 YLW CONV (MISCELLANEOUS) ×6 IMPLANT
PATTIES SURGICAL .5 X.5 (GAUZE/BANDAGES/DRESSINGS) IMPLANT
PATTIES SURGICAL .5 X1 (DISPOSABLE) ×3 IMPLANT
PIN DISTRACTION 14 (PIN) ×3 IMPLANT
PLATE SKYLINE TWO LEVEL 32MM (Plate) ×3 IMPLANT
PLATE TWO LEVEL SKYLINE 30MM (Plate) ×3 IMPLANT
PUTTY DBX 1CC (Putty) ×3 IMPLANT
PUTTY DBX 1CC DEPUY (Putty) ×1 IMPLANT
SCREW LOCKING VBR OMNI (Screw) ×3 IMPLANT
SCREW SKYLINE VAR OS 14MM (Screw) ×12 IMPLANT
SPONGE INTESTINAL PEANUT (DISPOSABLE) ×6 IMPLANT
SPONGE SURGIFOAM ABS GEL 100 (HEMOSTASIS) ×3 IMPLANT
SURGIFLO W/THROMBIN 8M KIT (HEMOSTASIS) ×3 IMPLANT
SUT BONE WAX W31G (SUTURE) ×3 IMPLANT
SUT MNCRL AB 4-0 PS2 18 (SUTURE) ×3 IMPLANT
SUT SILK 4 0 (SUTURE)
SUT SILK 4-0 18XBRD TIE 12 (SUTURE) IMPLANT
SUT VIC AB 2-0 CT2 18 VCP726D (SUTURE) ×3 IMPLANT
SYR BULB IRRIGATION 50ML (SYRINGE) ×3 IMPLANT
SYR CONTROL 10ML LL (SYRINGE) ×9 IMPLANT
TAPE CLOTH 4X10 WHT NS (GAUZE/BANDAGES/DRESSINGS) ×3 IMPLANT
TAPE CLOTH SOFT 2X10 (GAUZE/BANDAGES/DRESSINGS) ×3 IMPLANT
TAPE UMBILICAL COTTON 1/8X30 (MISCELLANEOUS) ×3 IMPLANT
TOWEL OR 17X24 6PK STRL BLUE (TOWEL DISPOSABLE) ×3 IMPLANT
TOWEL OR 17X26 10 PK STRL BLUE (TOWEL DISPOSABLE) ×3 IMPLANT
WATER STERILE IRR 1000ML POUR (IV SOLUTION) ×3 IMPLANT
YANKAUER SUCT BULB TIP NO VENT (SUCTIONS) ×3 IMPLANT

## 2018-07-15 NOTE — Op Note (Addendum)
PATIENT NAME: Corey Li  MEDICAL RECORD NO.: [id number]  PHYSICIAN: Estill Bamberg, MD DATE OF BIRTH: 1970/10/25  DATE OF PROCEDURE: 07/15/2018  OPERATIVE REPORT   PREOPERATIVE DIAGNOSES: 1. Progressive cervical myelopathy 2. Severe spinal cord compression spanning C5/6 through C6/7  POSTOPERATIVE DIAGNOSES: 1. Progressive cervical myelopathy 2. Severe spinal cord compression spanning C5/6 through C6/7  PROCEDURE (STAGE 1 OF 2): 1. Anterior cervical decompression and fusion C5/6, C6/7 2. C6 corpectomy (to remove migrated disc fragment behind C6 vertebral body) 3. Placement of anterior instrumentation, C5-C7. 4. Insertion of interbody/corpectomy device x 1 (Expandible Urlich spacer). 5. Intraoperative use of fluoroscopy. 6. Use of morselized allograft - ViviGen.  SURGEON: Estill Bamberg, MD  ASSISTANT: Jason Coop, PA-C.  ANESTHESIA: General endotracheal anesthesia.  COMPLICATIONS: None.  DISPOSITION: Stable.  ESTIMATED BLOOD LOSS: 200cc.  INDICATIONS FOR SURGERY: Briefly, Corey Li is a pleasant 48 y.o. year- old male, who did present to me with progressive cervical myelopathy, following a work injury. The patient's MRI did reveal the prominent findings noted above. Given the patient's ongoing and progressive weakness, and severe spinal cord compression, we did discuss proceeding with the procedure noted above on an urgent basis. The patient was fully aware of the risks and limitations of surgery as outlined in my preoperative note.  OPERATIVE DETAILS: On 07/15/2018 the patient was brought to surgery and general endotracheal anesthesia was administered. The patient was placed supine on the hospital bed. The neck was gently extended. All bony prominences were meticulously padded. The neck was prepped and draped in the usual sterile fashion. At this point, I did make a left-sided transverse incision. The  platysma was incised. A Smith-Robinson approach was used and the anterior spine was identified. A self-retaining retractor was placed. I then subperiosteally exposed the vertebral bodies from C5-C7. Caspar pins were then placed into the C6 and C7 vertebral bodies and distraction was applied. A thorough and complete C6-7 intervertebral diskectomy was performed.The posterior longitudinal ligament was identified and entered using a nerve hook. I then used #1 followed by #2 Kerrison to perform a thorough and complete intervertebral diskectomy. The spinal canal was thoroughly decompressed, as was the right and left neuroforamen. There was however noted to be additional disc material behind the C6 vertebral body. The lower Caspar pin was then removed and placed into the C5 vertebral body and once again, distraction was applied across the C5-6 intervertebral space. I then again performed a thorough and complete diskectomy, thoroughly decompressing the spinal canal and bilateral neuroforamena.The lower Caspar pin was then removed and placed back into the C7 vertebral body and once again, distraction was applied across C5-7. At this point, a corpectomy was performed. Specifically, the anterior vertebral body was removed using a rongeur and a high-speed bur. The posterior longitudinal ligament was noted and removed longitudinally. This did allow me to remove  additional disc fragments behind the C6 vertebral body, entirely decompressing the spinal cord. Some fragments were adherent to the dura, but were not compressive so they were maintained in order decrease risk of a durotomy. After preparing the endplates, the appropriate-sized expandable spacer was packed with ViviGen and tamped into position, after which point it was expanded until an excellent fit was noted.  The implant was placed toward the anterior aspect of C5 and the middle-posterior aspect of C7, as this is where it fit the best.  It  was expanded to about 78mm, in line with the patient's anatomy. The implant was then addtionally filled with DBX-putty.  The  Caspar pins then were removed and bone wax was placed in their place. The appropriate-sized anterior cervical plate was placed over the anterior spine. 14 mm variable angle screws were placed, 2 in each vertebral body at C5 and C7 for a total of 4 vertebral body screws. The screws were then locked to the plate using the Cam locking mechanism. I was very pleased with the final fluoroscopic images. The wound was then irrigated. The wound was then explored for any undue bleeding and there was no bleeding noted. The wound was then closed in layers using 2-0 Vicryl, followed by 4-0 Monocryl. Benzoin and Steri-Strips were applied, followed by sterile dressing. All instrument counts were correct at the termination of the procedure.  Of note, Jason Coop, PA-C, was my assistant throughout surgery, and did aid in retraction, suctioning, and closure from start to finish.     Estill Bamberg, MD

## 2018-07-15 NOTE — Anesthesia Procedure Notes (Addendum)
Procedure Name: Intubation Date/Time: 07/15/2018 12:34 PM Performed by: Fransisca Kaufmann, CRNA Pre-anesthesia Checklist: Patient identified, Emergency Drugs available, Suction available and Patient being monitored Patient Re-evaluated:Patient Re-evaluated prior to induction Oxygen Delivery Method: Circle System Utilized Preoxygenation: Pre-oxygenation with 100% oxygen Induction Type: IV induction Ventilation: Mask ventilation without difficulty Laryngoscope Size: Glidescope and 4 Grade View: Grade II Tube type: Oral Tube size: 7.5 mm Number of attempts: 1 Airway Equipment and Method: Stylet,  Oral airway,  Rigid stylet and Video-laryngoscopy Placement Confirmation: ETT inserted through vocal cords under direct vision,  positive ETCO2 and breath sounds checked- equal and bilateral Secured at: 23 cm Tube secured with: Tape Dental Injury: Teeth and Oropharynx as per pre-operative assessment  Difficulty Due To: Difficulty was anticipated, Difficult Airway- due to reduced neck mobility, Difficult Airway- due to anterior larynx, Difficult Airway- due to dentition and Difficult Airway- due to limited oral opening

## 2018-07-15 NOTE — Progress Notes (Signed)
CBG 66, MD notified, orders given, will continue to monitor.

## 2018-07-15 NOTE — H&P (Signed)
PREOPERATIVE H&P  Chief Complaint: Progressive upper and lower extremity weakness  HPI: Corey Li is a 48 y.o. male who presents with ongoing and progressive weakness in the upper and lower extremities.  Of note, he is status post a work injury that occurred on 05/07/2018.  His function has been gradually deteriorating since then.  He also complains of pain in the bilateral arms.  He clearly has been gradually losing control of his fine motor skills and his balance.  MRI reveals a very large C5-6 disc herniation, migrated behind the C6 vertebral body.  There is severe spinal cord compression noted, spanning C5-6, and across the C6 vertebral body, down to the C6-7 intervertebral space.  Patient has failed multiple forms of conservative care and continues to have pain (see office notes for additional details regarding the patient's full course of treatment)  Past Medical History:  Diagnosis Date  . Anxiety   . Depression   . Diabetes mellitus without complication (HCC)    Type II  . GERD (gastroesophageal reflux disease)   . H/O seasonal allergies   . Headache   . Hyperlipidemia   . Hypertension   . Hypothyroidism   . Neuropathy   . Numbness and tingling of both lower extremities   . PTSD (post-traumatic stress disorder)   . Sleep apnea   . Thyroid disease    Past Surgical History:  Procedure Laterality Date  . HERNIA REPAIR     child   Social History   Socioeconomic History  . Marital status: Single    Spouse name: Not on file  . Number of children: Not on file  . Years of education: Not on file  . Highest education level: Not on file  Occupational History  . Not on file  Social Needs  . Financial resource strain: Not on file  . Food insecurity:    Worry: Not on file    Inability: Not on file  . Transportation needs:    Medical: Not on file    Non-medical: Not on file  Tobacco Use  . Smoking status: Current Every Day Smoker    Packs/day: 0.40    Years:  25.00    Pack years: 10.00    Types: Cigarettes  . Smokeless tobacco: Former Neurosurgeon  . Tobacco comment: has chewed in past  Substance and Sexual Activity  . Alcohol use: Not Currently  . Drug use: No  . Sexual activity: Yes  Lifestyle  . Physical activity:    Days per week: Not on file    Minutes per session: Not on file  . Stress: Not on file  Relationships  . Social connections:    Talks on phone: Not on file    Gets together: Not on file    Attends religious service: Not on file    Active member of club or organization: Not on file    Attends meetings of clubs or organizations: Not on file    Relationship status: Not on file  Other Topics Concern  . Not on file  Social History Narrative  . Not on file   No family history on file. No Known Allergies Prior to Admission medications   Medication Sig Start Date End Date Taking? Authorizing Provider  amLODipine (NORVASC) 5 MG tablet Take 5 mg by mouth daily.   Yes [provider]  atorvastatin (LIPITOR) 80 MG tablet Take 80 mg by mouth daily.    Yes [provider]  FLUoxetine (PROZAC) 10  MG capsule Take 10 mg by mouth daily.    Yes [provider]  furosemide (LASIX) 40 MG tablet Take 40 mg by mouth 2 (two) times daily.   Yes [provider]  gabapentin (NEURONTIN) 300 MG capsule Take 900-1,200 mg by mouth See admin instructions. 900 mg twice daily, and 1200 mg at bedtime ONLY AS NEEDED   Yes [provider]  HYDROcodone-acetaminophen (NORCO/VICODIN) 5-325 MG tablet Take 1 tablet by mouth 3 (three) times daily as needed for moderate pain.   Yes [provider]  insulin NPH Human (HUMULIN N,NOVOLIN N) 100 UNIT/ML injection Inject 20-30 Units into the skin See admin instructions. 30 units in the morning, and 20 units in the evening   Yes [provider]  insulin regular (NOVOLIN R,HUMULIN R) 100 units/mL injection Inject 2-10 Units into the skin 2 (two) times daily. Per  Sliding scale   Yes [provider]  levothyroxine (SYNTHROID, LEVOTHROID) 175 MCG tablet Take 175 mcg by mouth daily before breakfast.   Yes [provider]  lisinopril (PRINIVIL,ZESTRIL) 40 MG tablet Take 40 mg by mouth daily.   Yes [provider]  metFORMIN (GLUCOPHAGE) 1000 MG tablet Take 1,000 mg by mouth 2 (two) times daily with a meal.   Yes [provider]  omeprazole (PRILOSEC) 20 MG capsule Take 20 mg by mouth daily.    Yes [provider]     All other systems have been reviewed and were otherwise negative with the exception of those mentioned in the HPI and as above.  Physical Exam: There were no vitals filed for this visit.  There is no height or weight on file to calculate BMI.  General: Alert, no acute distress Cardiovascular: No pedal edema Respiratory: No cyanosis, no use of accessory musculature Skin: No lesions in the area of chief complaint Neurologic: Patient is noted to have a wide-based and unsteady gait.  He has prominent weakness in his bilateral upper and lower extremities as well. 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 He now has weakness and spinal cord compression. I do feel that delaying his surgery for another month would increase his risk for permanent neurologic dysfunction *   Jackelyn Hoehn, MD 07/15/2018 8:13 AM

## 2018-07-15 NOTE — Transfer of Care (Signed)
Immediate Anesthesia Transfer of Care Note  Patient: [name]  Procedure(s) Performed: ANTERIOR CERVICAL DECOMPRESSION/DISCECTOMY FUSION TWO LEVELS WITH CERVICAL SIX CORPECTOMY (N/A Neck)  Patient Location: PACU  Anesthesia Type:General  Level of Consciousness: awake, alert  and oriented  Airway & Oxygen Therapy: Patient Spontanous Breathing and Patient connected to face mask oxygen  Post-op Assessment: Report given to RN and Post -op Vital signs reviewed and stable  Post vital signs: Reviewed and stable  Last Vitals:  Vitals Value Taken Time  BP 148/90 07/15/2018  4:57 PM  Temp    Pulse 66 07/15/2018  4:59 PM  Resp 0 07/15/2018  4:59 PM  SpO2 100 % 07/15/2018  4:59 PM  Vitals shown include unvalidated device data.  Last Pain:  Vitals:   07/15/18 0901  TempSrc:   PainSc: 5       Patients Stated Pain Goal: 2 (07/15/18 0901)  Complications: No apparent anesthesia complications

## 2018-07-15 NOTE — Progress Notes (Signed)
Inpatient Diabetes Program Recommendations  AACE/ADA: New Consensus Statement on Inpatient Glycemic Control   Target Ranges:  Prepandial:   less than 140 mg/dL      Peak postprandial:   less than 180 mg/dL (1-2 hours)      Critically ill patients:  140 - 180 mg/dL   Results for ARIHANT, BYRDSONG (MRN [id number]) as of 07/15/2018 09:09  Ref. Range 07/15/2018 08:43  Glucose-Capillary Latest Ref Range: 70 - 99 mg/dL 63 (L)   Results for DAQUANN, DAZA (MRN [id number]) as of 07/15/2018 09:09  Ref. Range 07/13/2018 11:18  Glucose Latest Ref Range: 70 - 99 mg/dL 748 (H)  Hemoglobin O7M Latest Ref Range: 4.8 - 5.6 % 10.9 (H)   Review of Glycemic Control  Outpatient Diabetes medications: NPH 30 units QAM, NPH 20 units QPM, Regular 2-10 units BID, Metformin 1000 mg BID Current orders for Inpatient glycemic control:  None  Inpatient Diabetes Program Recommendations:   Insulin-SQ while inpatient: If patient is admitted, please consider ordering Lantus 25 units QHS (based on 85 kg x 0.3 units), CBGs AC&HS, Novolog 0-9 units TID with meals, and Novolog 0-5 units QHS. Once diet is resumed and if post prandial glucose is consistently greater than 180 mg/dl, please consider ordering Novolog 3 units TID with meals for meal coverage if patient eats at least 50% of meals.  HgbA1C: A1C 10.9% on 07/13/18 indicating an average glucose of 266 mg/dl over the past 2-3 months.  NOTE: Noted consult for Diabetes Coordinator (A1C 10.9%). Patient is currently in Perioperative area. Will talk with patient when appropriate.  Thanks, Orlando Penner, RN, MSN, CDE Diabetes Coordinator Inpatient Diabetes Program [phone] (Team Pager from 8am to 5pm)

## 2018-07-16 ENCOUNTER — Encounter (HOSPITAL_COMMUNITY)
Admission: AD | Disposition: A | Discharge status: Home Health | Payer: Self-pay | Source: Home / Self Care | Attending: Orthopedic Surgery

## 2018-07-16 ENCOUNTER — Ambulatory Visit (HOSPITAL_COMMUNITY)
Admission: RE | Admit: 2018-07-16 | Payer: Worker's Compensation | Source: Home / Self Care | Admitting: Orthopedic Surgery

## 2018-07-16 ENCOUNTER — Observation Stay (HOSPITAL_COMMUNITY): Payer: Worker's Compensation | Admitting: Anesthesiology

## 2018-07-16 ENCOUNTER — Observation Stay (HOSPITAL_COMMUNITY): Payer: Worker's Compensation

## 2018-07-16 ENCOUNTER — Encounter (HOSPITAL_COMMUNITY): Payer: Self-pay | Admitting: Orthopedic Surgery

## 2018-07-16 HISTORY — PX: POSTERIOR CERVICAL FUSION/FORAMINOTOMY: SHX5038

## 2018-07-16 LAB — GLUCOSE, CAPILLARY
Glucose-Capillary: 203 mg/dL — ABNORMAL HIGH (ref 70–99)
Glucose-Capillary: 167 mg/dL — ABNORMAL HIGH (ref 70–99)
Glucose-Capillary: 158 mg/dL — ABNORMAL HIGH (ref 70–99)
Glucose-Capillary: 143 mg/dL — ABNORMAL HIGH (ref 70–99)
Glucose-Capillary: 213 mg/dL — ABNORMAL HIGH (ref 70–99)
Glucose-Capillary: 311 mg/dL — ABNORMAL HIGH (ref 70–99)
Glucose-Capillary: 440 mg/dL — ABNORMAL HIGH (ref 70–99)
Glucose-Capillary: 339 mg/dL — ABNORMAL HIGH (ref 70–99)

## 2018-07-16 SURGERY — POSTERIOR CERVICAL FUSION/FORAMINOTOMY LEVEL 2
Anesthesia: General | Site: Neck

## 2018-07-16 MED ORDER — ONDANSETRON HCL 4 MG/2ML IJ SOLN: 4.0000 mg | Freq: Once | INTRAMUSCULAR | Status: DC | PRN

## 2018-07-16 MED ORDER — FENTANYL CITRATE (PF) 100 MCG/2ML IJ SOLN: 25.0000 ug | INTRAMUSCULAR | Status: DC | PRN

## 2018-07-16 MED ORDER — OXYCODONE-ACETAMINOPHEN 5-325 MG PO TABS
1.0000 | ORAL_TABLET | ORAL | Status: DC | PRN
  Administered 2018-07-16 – 2018-07-18 (×10): 2 via ORAL
  Filled 2018-07-16 (×10): qty 2

## 2018-07-16 MED ORDER — LIDOCAINE 2% (20 MG/ML) 5 ML SYRINGE
INTRAMUSCULAR | Status: DC | PRN
  Administered 2018-07-16: 40 mg via INTRAVENOUS

## 2018-07-16 MED ORDER — FENTANYL CITRATE (PF) 250 MCG/5ML IJ SOLN
INTRAMUSCULAR | Status: AC
  Filled 2018-07-16: qty 5

## 2018-07-16 MED ORDER — DEXAMETHASONE SODIUM PHOSPHATE 10 MG/ML IJ SOLN
INTRAMUSCULAR | Status: DC | PRN
  Administered 2018-07-16: 4 mg via INTRAVENOUS

## 2018-07-16 MED ORDER — EPHEDRINE SULFATE-NACL 50-0.9 MG/10ML-% IV SOSY
PREFILLED_SYRINGE | INTRAVENOUS | Status: DC | PRN
  Administered 2018-07-16: 10 mg via INTRAVENOUS
  Administered 2018-07-16: 15 mg via INTRAVENOUS
  Administered 2018-07-16: 10 mg via INTRAVENOUS

## 2018-07-16 MED ORDER — OXYCODONE HCL 5 MG/5ML PO SOLN: 5.0000 mg | Freq: Once | ORAL | Status: DC | PRN

## 2018-07-16 MED ORDER — BACITRACIN ZINC 500 UNIT/GM EX OINT
TOPICAL_OINTMENT | CUTANEOUS | Status: AC
  Filled 2018-07-16: qty 28.35

## 2018-07-16 MED ORDER — PROPOFOL 10 MG/ML IV BOLUS
INTRAVENOUS | Status: DC | PRN
  Administered 2018-07-16: 140 mg via INTRAVENOUS
  Administered 2018-07-16: 50 mg via INTRAVENOUS

## 2018-07-16 MED ORDER — SODIUM CHLORIDE 0.9 % IV SOLN
INTRAVENOUS | Status: DC | PRN
  Administered 2018-07-16: 50 ug/min via INTRAVENOUS

## 2018-07-16 MED ORDER — 0.9 % SODIUM CHLORIDE (POUR BTL) OPTIME
TOPICAL | Status: DC | PRN
  Administered 2018-07-16 (×4): 1000 mL

## 2018-07-16 MED ORDER — PHENYLEPHRINE 40 MCG/ML (10ML) SYRINGE FOR IV PUSH (FOR BLOOD PRESSURE SUPPORT)
PREFILLED_SYRINGE | INTRAVENOUS | Status: DC | PRN
  Administered 2018-07-16: 240 ug via INTRAVENOUS

## 2018-07-16 MED ORDER — THROMBIN (RECOMBINANT) 20000 UNITS EX SOLR
CUTANEOUS | Status: AC
  Filled 2018-07-16: qty 20000

## 2018-07-16 MED ORDER — BUPIVACAINE LIPOSOME 1.3 % IJ SUSP
20.0000 mL | Freq: Once | INTRAMUSCULAR | Status: DC
  Filled 2018-07-16: qty 20

## 2018-07-16 MED ORDER — ROCURONIUM BROMIDE 10 MG/ML (PF) SYRINGE
PREFILLED_SYRINGE | INTRAVENOUS | Status: DC | PRN
  Administered 2018-07-16: 20 mg via INTRAVENOUS
  Administered 2018-07-16: 10 mg via INTRAVENOUS
  Administered 2018-07-16: 30 mg via INTRAVENOUS
  Administered 2018-07-16: 20 mg via INTRAVENOUS
  Administered 2018-07-16: 50 mg via INTRAVENOUS

## 2018-07-16 MED ORDER — HEMOSTATIC AGENTS (NO CHARGE) OPTIME
TOPICAL | Status: DC | PRN
  Administered 2018-07-16: 1 via TOPICAL

## 2018-07-16 MED ORDER — SODIUM CHLORIDE (PF) 0.9 % IJ SOLN
INTRAMUSCULAR | Status: AC
  Filled 2018-07-16: qty 10

## 2018-07-16 MED ORDER — BUPIVACAINE-EPINEPHRINE (PF) 0.25% -1:200000 IJ SOLN
INTRAMUSCULAR | Status: AC
  Filled 2018-07-16: qty 30

## 2018-07-16 MED ORDER — LACTATED RINGERS IV SOLN
INTRAVENOUS | Status: DC | PRN
  Administered 2018-07-16: 07:00:00 via INTRAVENOUS

## 2018-07-16 MED ORDER — ROCURONIUM BROMIDE 50 MG/5ML IV SOSY
PREFILLED_SYRINGE | INTRAVENOUS | Status: AC
  Filled 2018-07-16: qty 5

## 2018-07-16 MED ORDER — DEXAMETHASONE SODIUM PHOSPHATE 10 MG/ML IJ SOLN
INTRAMUSCULAR | Status: AC
  Filled 2018-07-16: qty 1

## 2018-07-16 MED ORDER — THROMBIN 20000 UNITS EX SOLR
CUTANEOUS | Status: DC | PRN
  Administered 2018-07-16: 20000 [IU] via TOPICAL

## 2018-07-16 MED ORDER — ONDANSETRON HCL 4 MG/2ML IJ SOLN
INTRAMUSCULAR | Status: DC | PRN
  Administered 2018-07-16: 4 mg via INTRAVENOUS

## 2018-07-16 MED ORDER — CEFAZOLIN SODIUM-DEXTROSE 2-3 GM-%(50ML) IV SOLR
INTRAVENOUS | Status: DC | PRN
  Administered 2018-07-16: 2 g via INTRAVENOUS

## 2018-07-16 MED ORDER — BUPIVACAINE LIPOSOME 1.3 % IJ SUSP
INTRAMUSCULAR | Status: DC | PRN
  Administered 2018-07-16: 20 mL

## 2018-07-16 MED ORDER — OXYCODONE HCL 5 MG PO TABS: 5.0000 mg | ORAL_TABLET | Freq: Once | ORAL | Status: DC | PRN

## 2018-07-16 MED ORDER — MIDAZOLAM HCL 2 MG/2ML IJ SOLN
INTRAMUSCULAR | Status: AC
  Filled 2018-07-16: qty 4

## 2018-07-16 MED ORDER — BUPIVACAINE-EPINEPHRINE 0.25% -1:200000 IJ SOLN
INTRAMUSCULAR | Status: DC | PRN
  Administered 2018-07-16: 20 mL
  Administered 2018-07-16: 10 mL

## 2018-07-16 MED ORDER — LACTATED RINGERS IV SOLN
INTRAVENOUS | Status: DC | PRN
  Administered 2018-07-16: 08:00:00 via INTRAVENOUS

## 2018-07-16 MED ORDER — PROPOFOL 10 MG/ML IV BOLUS
INTRAVENOUS | Status: AC
  Filled 2018-07-16: qty 20

## 2018-07-16 MED ORDER — FENTANYL CITRATE (PF) 100 MCG/2ML IJ SOLN
INTRAMUSCULAR | Status: DC | PRN
  Administered 2018-07-16: 150 ug via INTRAVENOUS
  Administered 2018-07-16 (×2): 50 ug via INTRAVENOUS

## 2018-07-16 MED ORDER — ACETAMINOPHEN 10 MG/ML IV SOLN
1000.0000 mg | Freq: Once | INTRAVENOUS | Status: AC
  Administered 2018-07-16: 13:00:00 1000 mg via INTRAVENOUS

## 2018-07-16 MED ORDER — SUGAMMADEX SODIUM 200 MG/2ML IV SOLN
INTRAVENOUS | Status: DC | PRN
  Administered 2018-07-16: 200 mg via INTRAVENOUS

## 2018-07-16 MED ORDER — ONDANSETRON HCL 4 MG/2ML IJ SOLN
INTRAMUSCULAR | Status: AC
  Filled 2018-07-16: qty 2

## 2018-07-16 MED ORDER — MIDAZOLAM HCL 2 MG/2ML IJ SOLN
INTRAMUSCULAR | Status: DC | PRN
  Administered 2018-07-16: 2 mg via INTRAVENOUS

## 2018-07-16 SURGICAL SUPPLY — 85 items
BENZOIN TINCTURE PRP APPL 2/3 (GAUZE/BANDAGES/DRESSINGS) IMPLANT
BIT DRILL MOUNTAINEER FIX 14 (BIT) ×2
BIT DRILL MOUNTAINEER FIX 14MM (BIT) ×2 IMPLANT
BIT DRILL MOUNTAINER FXED 12MM (DRILL) ×2 IMPLANT
BLADE CLIPPER SURG (BLADE) IMPLANT
BONE MATRIX VIVIGEN 5CC (Bone Implant) ×4 IMPLANT
BUR NEURO DRILL SOFT 3.0X3.8M (BURR) ×4 IMPLANT
CLOSURE STERI-STRIP 1/2X4 (GAUZE/BANDAGES/DRESSINGS) ×1
CLOSURE WOUND 1/2 X4 (GAUZE/BANDAGES/DRESSINGS)
CLSR STERI-STRIP ANTIMIC 1/2X4 (GAUZE/BANDAGES/DRESSINGS) ×3 IMPLANT
CONT SPEC 4OZ CLIKSEAL STRL BL (MISCELLANEOUS) IMPLANT
CORDS BIPOLAR (ELECTRODE) ×4 IMPLANT
COVER SURGICAL LIGHT HANDLE (MISCELLANEOUS) ×4 IMPLANT
COVER WAND RF STERILE (DRAPES) IMPLANT
DRAIN CHANNEL 15F RND FF W/TCR (WOUND CARE) IMPLANT
DRAPE C-ARM 42X72 X-RAY (DRAPES) ×4 IMPLANT
DRAPE HALF SHEET 40X57 (DRAPES) ×24 IMPLANT
DRAPE LAPAROTOMY 100X72 PEDS (DRAPES) ×4 IMPLANT
DRAPE POUCH INSTRU U-SHP 10X18 (DRAPES) ×4 IMPLANT
DRAPE SURG 17X23 STRL (DRAPES) ×20 IMPLANT
DRILL BIT MOUNTAINEER FIX 14MM (BIT) ×2
DRILL MOUNTAINEER FIXED 12MM (DRILL) ×4
DRSG MEPILEX BORDER 4X12 (GAUZE/BANDAGES/DRESSINGS) IMPLANT
DRSG MEPILEX BORDER 4X8 (GAUZE/BANDAGES/DRESSINGS) IMPLANT
DURAPREP 26ML APPLICATOR (WOUND CARE) ×4 IMPLANT
ELECT BLADE 4.0 EZ CLEAN MEGAD (MISCELLANEOUS) ×4
ELECT CAUTERY BLADE 6.4 (BLADE) ×8 IMPLANT
ELECT REM PT RETURN 9FT ADLT (ELECTROSURGICAL) ×4
ELECTRODE BLDE 4.0 EZ CLN MEGD (MISCELLANEOUS) ×2 IMPLANT
ELECTRODE REM PT RTRN 9FT ADLT (ELECTROSURGICAL) ×2 IMPLANT
EVACUATOR SILICONE 100CC (DRAIN) IMPLANT
GAUZE 4X4 16PLY RFD (DISPOSABLE) ×16 IMPLANT
GAUZE SPONGE 4X4 12PLY STRL (GAUZE/BANDAGES/DRESSINGS) ×4 IMPLANT
GAUZE SPONGE 4X4 12PLY STRL LF (GAUZE/BANDAGES/DRESSINGS) ×4 IMPLANT
GLOVE BIO SURGEON STRL SZ7 (GLOVE) ×4 IMPLANT
GLOVE BIO SURGEON STRL SZ8 (GLOVE) ×4 IMPLANT
GLOVE BIOGEL PI IND STRL 8 (GLOVE) ×2 IMPLANT
GLOVE BIOGEL PI INDICATOR 8 (GLOVE) ×2
GOWN STRL REUS W/ TWL LRG LVL3 (GOWN DISPOSABLE) ×4 IMPLANT
GOWN STRL REUS W/ TWL XL LVL3 (GOWN DISPOSABLE) ×2 IMPLANT
GOWN STRL REUS W/TWL LRG LVL3 (GOWN DISPOSABLE) ×4
GOWN STRL REUS W/TWL XL LVL3 (GOWN DISPOSABLE) ×2
IV CATH 14GX2 1/4 (CATHETERS) ×4 IMPLANT
KIT BASIN OR (CUSTOM PROCEDURE TRAY) ×4 IMPLANT
KIT TURNOVER KIT B (KITS) ×4 IMPLANT
MARKER SKIN DUAL TIP RULER LAB (MISCELLANEOUS) ×4 IMPLANT
NEEDLE HYPO 25GX1X1/2 BEV (NEEDLE) ×4 IMPLANT
NEEDLE SPNL 18GX3.5 QUINCKE PK (NEEDLE) ×8 IMPLANT
NS IRRIG 1000ML POUR BTL (IV SOLUTION) ×4 IMPLANT
PACK LAMINECTOMY ORTHO (CUSTOM PROCEDURE TRAY) ×4 IMPLANT
PACK UNIVERSAL I (CUSTOM PROCEDURE TRAY) ×4 IMPLANT
PAD ARMBOARD 7.5X6 YLW CONV (MISCELLANEOUS) ×8 IMPLANT
PATTIES SURGICAL .5 X1 (DISPOSABLE) ×4 IMPLANT
PATTIES SURGICAL .5 X3 (DISPOSABLE) ×4 IMPLANT
PATTIES SURGICAL .5X1.5 (GAUZE/BANDAGES/DRESSINGS) ×4 IMPLANT
PATTIES SURGICAL .75X.75 (GAUZE/BANDAGES/DRESSINGS) ×4 IMPLANT
PUTTY DBX 1CC (Putty) ×4 IMPLANT
PUTTY DBX 1CC DEPUY (Putty) ×2 IMPLANT
ROD MOUNTAINEER 120MM (Rod) ×4 IMPLANT
SCREW F A 3.5X14 (Screw) ×8 IMPLANT
SCREW INNER (Screw) ×16 IMPLANT
SCREW MOUNTAINEER 3.5X24MM (Screw) ×8 IMPLANT
SPONGE INTESTINAL PEANUT (DISPOSABLE) ×4 IMPLANT
SPONGE SURGIFOAM ABS GEL 100 (HEMOSTASIS) ×4 IMPLANT
STRIP CLOSURE SKIN 1/2X4 (GAUZE/BANDAGES/DRESSINGS) IMPLANT
SURGIFLO W/THROMBIN 8M KIT (HEMOSTASIS) IMPLANT
SUT ETHILON 2 0 FS 18 (SUTURE) ×8 IMPLANT
SUT MNCRL AB 3-0 PS2 18 (SUTURE) ×4 IMPLANT
SUT MNCRL AB 4-0 PS2 18 (SUTURE) IMPLANT
SUT VIC AB 0 CT1 18XCR BRD 8 (SUTURE) ×2 IMPLANT
SUT VIC AB 0 CT1 8-18 (SUTURE) ×2
SUT VIC AB 1 CT1 18XCR BRD 8 (SUTURE) ×2 IMPLANT
SUT VIC AB 1 CT1 8-18 (SUTURE) ×2
SUT VIC AB 2-0 CT2 18 VCP726D (SUTURE) ×8 IMPLANT
SYR 20CC LL (SYRINGE) ×4 IMPLANT
SYR BULB IRRIGATION 50ML (SYRINGE) ×4 IMPLANT
SYR CONTROL 10ML LL (SYRINGE) ×8 IMPLANT
TAP MOUNTAINEER 3MM (TAP) ×4 IMPLANT
TAPE CLOTH SURG 4X10 WHT LF (GAUZE/BANDAGES/DRESSINGS) ×4 IMPLANT
TEMPLATE ROD MOUNTAINEER (INSTRUMENTS) ×8 IMPLANT
TOWEL OR 17X24 6PK STRL BLUE (TOWEL DISPOSABLE) ×4 IMPLANT
TOWEL OR 17X26 10 PK STRL BLUE (TOWEL DISPOSABLE) ×4 IMPLANT
TRAY FOLEY MTR SLVR 16FR STAT (SET/KITS/TRAYS/PACK) ×4 IMPLANT
WATER STERILE IRR 1000ML POUR (IV SOLUTION) ×4 IMPLANT
YANKAUER SUCT BULB TIP NO VENT (SUCTIONS) ×4 IMPLANT

## 2018-07-16 NOTE — Anesthesia Procedure Notes (Signed)
Procedure Name: Intubation Date/Time: 07/16/2018 7:38 AM Performed by: Jodell Cipro, CRNA Pre-anesthesia Checklist: Patient identified, Emergency Drugs available, Suction available and Patient being monitored Patient Re-evaluated:Patient Re-evaluated prior to induction Oxygen Delivery Method: Circle System Utilized Preoxygenation: Pre-oxygenation with 100% oxygen Induction Type: IV induction Ventilation: Mask ventilation without difficulty and Oral airway inserted - appropriate to patient size Laryngoscope Size: Glidescope and 4 Grade View: Grade II Tube type: Oral Tube size: 7.5 mm Number of attempts: 1 Airway Equipment and Method: Oral airway,  Video-laryngoscopy and Rigid stylet Placement Confirmation: ETT inserted through vocal cords under direct vision,  positive ETCO2 and breath sounds checked- equal and bilateral Secured at: 23 cm Tube secured with: Tape Dental Injury: Teeth and Oropharynx as per pre-operative assessment

## 2018-07-16 NOTE — H&P (Signed)
Patient tolerated stage 1 of his procedure well yesterday. Presents today for stage 2. Will proceed as planned.Marland KitchenMarland Kitchen

## 2018-07-16 NOTE — Transfer of Care (Signed)
Immediate Anesthesia Transfer of Care Note  Patient: Corey Li  Procedure(s) Performed: Posterior Cervical Fusion/Foraminotomy Level 2 (Neck)  Patient Location: PACU  Anesthesia Type:General  Level of Consciousness: awake, alert  and oriented  Airway & Oxygen Therapy: Patient Spontanous Breathing and Patient connected to face mask oxygen  Post-op Assessment: Report given to RN, Post -op Vital signs reviewed and stable and Patient moving all extremities X 4  Post vital signs: Reviewed and stable  Last Vitals:  Vitals Value Taken Time  BP 151/98 07/16/2018 12:56 PM  Temp    Pulse 78 07/16/2018 12:59 PM  Resp 22 07/16/2018 12:56 PM  SpO2 98 % 07/16/2018 12:59 PM  Vitals shown include unvalidated device data.  Last Pain:  Vitals:   07/16/18 0524  TempSrc:   PainSc: Asleep      Patients Stated Pain Goal: 3 (07/15/18 1655)  Complications: No apparent anesthesia complications

## 2018-07-16 NOTE — Anesthesia Postprocedure Evaluation (Signed)
Anesthesia Post Note  Patient: [name]  Procedure(s) Performed: ANTERIOR CERVICAL DECOMPRESSION/DISCECTOMY FUSION TWO LEVELS WITH CERVICAL SIX CORPECTOMY (N/A Neck)     Patient location during evaluation: PACU Anesthesia Type: General Level of consciousness: awake and alert Pain management: pain level controlled Vital Signs Assessment: post-procedure vital signs reviewed and stable Respiratory status: spontaneous breathing, nonlabored ventilation, respiratory function stable and patient connected to nasal cannula oxygen Cardiovascular status: blood pressure returned to baseline and stable Postop Assessment: no apparent nausea or vomiting Anesthetic complications: no    Last Vitals:  Vitals:   07/16/18 1347 07/16/18 1651  BP: (!) 181/91 (!) 162/108  Pulse: 72 87  Resp: 16 18  Temp: (!) 36.4 C 36.8 C  SpO2: 97% 98%    Last Pain:  Vitals:   07/16/18 1651  TempSrc: Oral  PainSc:                  Kennieth Rad

## 2018-07-16 NOTE — Op Note (Signed)
PATIENT NAME: [facility]  MEDICAL RECORD NO.:  [id number]   PHYSICIAN: Estill Bamberg, MD DATE OF BIRTH: 09-13-1970  DATE OF PROCEDURE: 07/16/18                                                              OPERATIVE REPORT   PREOPERATIVE DIAGNOSES: 1.  Status post previous C5-C7 anterior cervical diskectomy and C6 corpectomy, requiring posterior instrumentation with fusion and decompression   POSTOPERATIVE DIAGNOSES: 1.  Status post previous C5-C7 anterior cervical diskectomy and C6 corpectomy, requiring posterior instrumentation with fusion and decompression  PROCEDURE (STAGE 2 of 2): 1.  Posterior spinal fusion C5-C6, C6-7. 2.  Placement of posterior segmental instrumentation C5-C7. 3.  Bilateral foraminotomies and partial facetectomies, C6-7 3.  Use of local autograft. 4.  Use of morselized allograft - Vivigen and DBX putty. 5.  Cranial tong application and removal. 6.  Intraoperative use of fluoroscopy.  SURGEON:  Estill Bamberg, MD  ASSISTANT:  Jason Coop, PA-C   ANESTHESIA:  General endotracheal anesthesia.  COMPLICATIONS:  None.  DISPOSITION:  Stable.  ESTIMATED BLOOD LOSS:  Minimal.  INDICATIONS FOR SURGERY:  Briefly, the patient is a very pleasant 48 year old male who is status post the surgery as noted above.  The patient did present today for stage 2 of his procedure, specifically, a posterior fusion with instrumentation, with bilateral partial facetectomies and foraminotomies at C6-7.  OPERATIVE DETAILS:  On 07/16/18, the patient was brought to surgery and general endotracheal anesthesia was administered.  A Mayfield head holder was applied.  The patient was then rolled prone, and the Mayfield headholder was secured to the bed with the head positioned into the appropriate degree of lordosis.  The patient's arms were secured to his sides, and all bony prominences were padded.  The neck was then prepped and draped posteriorly in the usual fashion.   A timeout procedure was performed.  At this point, I did make a midline incision.  The fascia was incised at the midline.  The paraspinal musculature was bluntly swept laterally, and the posterior elements of C5, C6, and C7 were identified and subperiosteally exposed.  The facet joints to be fused were subperiosteally exposed as well.  I did use a 1.7 mm high-speed burr to decorticate the facet joints at C5-6 and C6-7 bilaterally.  A 3 mm bur was used to decorticate the posterior elements across these levels as well.  Using anatomic landmarks, I did use a 1.7 mm bur to prepare the entry point of the lateral mass screws at C5 bilaterally.  A 3 mm tap was used, and 3.5 x 14 mm screws were advanced into the lateral masses of C5 bilaterally.  Then, bilaterally, I performed partial facetectomies at C6-7.  This did entirely and thoroughly decompress the foramen on the right and left sides at C6-7.  Then, using a nerve hook, I was able to palpate the medial and superior borders of the C7 pedicles.  This did allow me to use a 1.7 mm bur, followed by a gearshift probe, followed by a 3 mm tap, to cannulate the C7 pedicles bilaterally.  I then advanced 22 x 3.5 mm pedicle screws into the C7 pedicles bilaterally. Of note, the patient's right pedicle was noted to be very small, but I was able  to successfully cannulate it. Of note, a ball-tipped probe was used to confirm that there is no cortical violation of the taps prior to placing the pedicle screws.  No cortical violation was noted.  At this point, DBX putty was placed into the facet joints at C5-6 and C6-7 bilaterally.  Vivigen was then packed along the posterior elements spanning C5, C6, and C7.  A rod of the appropriate length was then secured bilaterally into the tulip heads of the screws.  Caps were placed in a final locking procedure was performed.  At the termination of the procedure, I did obtain AP and lateral fluoroscopic images, and was very pleased with the  appearance of the images.  The wound was copiously irrigated with approximately 2 L of normal saline prior to placing the bone graft. At this point, the wound was closed in layers using #1 Vicryl, followed by 2-0 Vicryl, followed by 4-0 Monocryl.  Benzoin and Steri-Strips were applied, followed by sterile dressing.  All instrument counts were correct at the termination of the procedure.  Of note, Jason Coop was my assistant throughout surgery and did aid in retraction, suctioning, and closure from start to finish.   Estill Bamberg, MD

## 2018-07-16 NOTE — Progress Notes (Signed)
CBG 440. Patient had just eaten dinner of cheeseburger and two orange sherberts prior to CBG check. Will administer sliding scale insulin and recheck within an hour.  Olevia Perches

## 2018-07-16 NOTE — Anesthesia Preprocedure Evaluation (Signed)
Anesthesia Evaluation  Patient identified by MRN, date of birth, ID band Patient awake    Reviewed: Allergy & Precautions, NPO status , Patient's Chart, lab work & pertinent test results  Airway Mallampati: III   Neck ROM: Limited  Mouth opening: Limited Mouth Opening  Dental  (+) Teeth Intact, Dental Advisory Given   Pulmonary Current Smoker,    breath sounds clear to auscultation       Cardiovascular hypertension,  Rhythm:Regular Rate:Normal     Neuro/Psych    GI/Hepatic   Endo/Other  diabetes  Renal/GU      Musculoskeletal   Abdominal   Peds  Hematology   Anesthesia Other Findings   Reproductive/Obstetrics                             Anesthesia Physical Anesthesia Plan  ASA: III  Anesthesia Plan: General   Post-op Pain Management:    Induction: Intravenous  PONV Risk Score and Plan: Ondansetron  Airway Management Planned: Oral ETT and Video Laryngoscope Planned  Additional Equipment:   Intra-op Plan:   Post-operative Plan: Possible Post-op intubation/ventilation  Informed Consent: I have reviewed the patients History and Physical, chart, labs and discussed the procedure including the risks, benefits and alternatives for the proposed anesthesia with the patient or authorized representative who has indicated his/her understanding and acceptance.     Dental advisory given  Plan Discussed with: Anesthesiologist and CRNA  Anesthesia Plan Comments:         Anesthesia Quick Evaluation

## 2018-07-16 NOTE — Progress Notes (Signed)
Inpatient Diabetes Program Recommendations  AACE/ADA: New Consensus Statement on Inpatient Glycemic Control (2015)  Target Ranges:  Prepandial:   less than 140 mg/dL      Peak postprandial:   less than 180 mg/dL (1-2 hours)      Critically ill patients:  140 - 180 mg/dL   Lab Results  Component Value Date   GLUCAP 143 (H) 07/16/2018   HGBA1C 10.9 (H) 07/13/2018    Review of Glycemic Control Results for Corey Li, Corey Li (MRN [id number]) as of 07/16/2018 11:58  Ref. Range 07/16/2018 05:18 07/16/2018 08:42 07/16/2018 09:56 07/16/2018 11:08  Glucose-Capillary Latest Ref Range: 70 - 99 mg/dL 297 (H) 989 (H) 211 (H) 143 (H)   Outpatient Diabetes medications: NPH 30 units QAM, NPH 20 units QPM, Regular 2-10 units BID, Metformin 1000 mg BID Current orders for Inpatient glycemic control:  Novolog 0-15 units TID, Novolog 0-5 units QHS, Metformin 1000 mg BID, NPH 20 units QPM, NPH 30 units QAM Decadron 4 mg x1  Inpatient Diabetes Program Recommendations:   Noted steroids given, thus anticipate trends to increase. Additionally, patient was not given AM dose of NPH due to stage 2 of surgery.Had hypoglycemia on 4/1 due to NPO status.    If patient is admitted, please consider ordering Lantus 25 units QD (based on 85 kg x 0.3 units) and changed to carb modified diet.  NOTE: Noted consult for Diabetes Coordinator (A1C 10.9%). Patient is currently in Perioperative area. Will talk with patient when appropriate.  Thanks, Lujean Rave, MSN, RNC-OB Diabetes Coordinator [phone] (8a-5p)

## 2018-07-16 NOTE — Progress Notes (Signed)
Recheck CBG 339. Patient up ambulating in hall way. Medicated for shoulder pain with diazepam and gabapentin as not time for percocet. Corey Li

## 2018-07-17 ENCOUNTER — Observation Stay (HOSPITAL_COMMUNITY): Payer: Worker's Compensation

## 2018-07-17 ENCOUNTER — Encounter (HOSPITAL_COMMUNITY): Payer: Self-pay | Admitting: Orthopedic Surgery

## 2018-07-17 DIAGNOSIS — E1140 Type 2 diabetes mellitus with diabetic neuropathy, unspecified: Secondary | ICD-10-CM | POA: Diagnosis not present

## 2018-07-17 DIAGNOSIS — Z79890 Hormone replacement therapy (postmenopausal): Secondary | ICD-10-CM | POA: Diagnosis not present

## 2018-07-17 DIAGNOSIS — M25512 Pain in left shoulder: Secondary | ICD-10-CM | POA: Diagnosis present

## 2018-07-17 DIAGNOSIS — F17210 Nicotine dependence, cigarettes, uncomplicated: Secondary | ICD-10-CM | POA: Diagnosis present

## 2018-07-17 DIAGNOSIS — E785 Hyperlipidemia, unspecified: Secondary | ICD-10-CM | POA: Diagnosis not present

## 2018-07-17 DIAGNOSIS — Z79899 Other long term (current) drug therapy: Secondary | ICD-10-CM | POA: Diagnosis not present

## 2018-07-17 DIAGNOSIS — G4730 Sleep apnea, unspecified: Secondary | ICD-10-CM | POA: Diagnosis present

## 2018-07-17 DIAGNOSIS — G9520 Unspecified cord compression: Secondary | ICD-10-CM | POA: Diagnosis present

## 2018-07-17 DIAGNOSIS — Z794 Long term (current) use of insulin: Secondary | ICD-10-CM | POA: Diagnosis not present

## 2018-07-17 DIAGNOSIS — F4310 Post-traumatic stress disorder, unspecified: Secondary | ICD-10-CM | POA: Diagnosis not present

## 2018-07-17 DIAGNOSIS — G959 Disease of spinal cord, unspecified: Secondary | ICD-10-CM | POA: Diagnosis present

## 2018-07-17 DIAGNOSIS — E039 Hypothyroidism, unspecified: Secondary | ICD-10-CM | POA: Diagnosis present

## 2018-07-17 DIAGNOSIS — M50123 Cervical disc disorder at C6-C7 level with radiculopathy: Secondary | ICD-10-CM | POA: Diagnosis not present

## 2018-07-17 DIAGNOSIS — F419 Anxiety disorder, unspecified: Secondary | ICD-10-CM | POA: Diagnosis present

## 2018-07-17 DIAGNOSIS — K219 Gastro-esophageal reflux disease without esophagitis: Secondary | ICD-10-CM | POA: Diagnosis present

## 2018-07-17 DIAGNOSIS — R531 Weakness: Secondary | ICD-10-CM | POA: Diagnosis present

## 2018-07-17 DIAGNOSIS — I10 Essential (primary) hypertension: Secondary | ICD-10-CM | POA: Diagnosis present

## 2018-07-17 LAB — GLUCOSE, CAPILLARY
Glucose-Capillary: 208 mg/dL — ABNORMAL HIGH (ref 70–99)
Glucose-Capillary: 406 mg/dL — ABNORMAL HIGH (ref 70–99)
Glucose-Capillary: 227 mg/dL — ABNORMAL HIGH (ref 70–99)
Glucose-Capillary: 170 mg/dL — ABNORMAL HIGH (ref 70–99)

## 2018-07-17 MED ORDER — DOCUSATE SODIUM 100 MG PO CAPS: 100.0000 mg | ORAL_CAPSULE | Freq: Two times a day (BID) | ORAL | Status: DC

## 2018-07-17 MED ORDER — INSULIN ASPART 100 UNIT/ML ~~LOC~~ SOLN
5.0000 [IU] | Freq: Three times a day (TID) | SUBCUTANEOUS | Status: DC
  Administered 2018-07-18 (×2): 5 [IU] via SUBCUTANEOUS

## 2018-07-17 MED ORDER — INSULIN NPH (HUMAN) (ISOPHANE) 100 UNIT/ML ~~LOC~~ SUSP
35.0000 [IU] | Freq: Every day | SUBCUTANEOUS | Status: DC
  Administered 2018-07-18: 35 [IU] via SUBCUTANEOUS
  Filled 2018-07-17: qty 10

## 2018-07-17 MED ORDER — METHOCARBAMOL 1000 MG/10ML IJ SOLN
500.0000 mg | Freq: Four times a day (QID) | INTRAVENOUS | Status: DC | PRN
  Administered 2018-07-17: 17:00:00 500 mg via INTRAVENOUS
  Filled 2018-07-17: qty 5

## 2018-07-17 MED ORDER — INSULIN NPH (HUMAN) (ISOPHANE) 100 UNIT/ML ~~LOC~~ SUSP
25.0000 [IU] | Freq: Every day | SUBCUTANEOUS | Status: DC
  Administered 2018-07-17: 17:00:00 25 [IU] via SUBCUTANEOUS
  Filled 2018-07-17: qty 10

## 2018-07-17 NOTE — Progress Notes (Addendum)
Results for LEGEN, EICHEL (MRN [id number]) as of 07/17/2018 12:18  Ref. Range 07/16/2018 16:51 07/16/2018 21:39 07/16/2018 22:50 07/17/2018 07:33 07/17/2018 11:30  Glucose-Capillary Latest Ref Range: 70 - 99 mg/dL 591 (H) 638 (H) 466 (H) 208 (H) 406 (H)  Noted that CBG was up to 406 mg/dl at noon.   Recommend increasing NPH to 35 units every am, 25 units every pm. Continue Novolog MODERATE correction scale TID & HS scale. Add Novolog 5 units TID as meal coverage if patient eats at least 50% of meal. May need to titrate dosages as needed.   Staff RN asked about giving the Novolog insulin per correction scale for CBG of 406 mg/dl. Recommend that CBG of 406 mg/dl be covered with Novolog 15 units now per scale.   ADDENDUM: spoke with Dr. Yevette Edwards on the phone about recommendations. He agreed with above recommendations.   Smith Mince RN BSN CDE Diabetes Coordinator Pager: [phone]  8am-5pm

## 2018-07-17 NOTE — Progress Notes (Signed)
I have been in contact with patient throughout the day. He is complaining of primarily left shoulder pain, which is particularly increased with overhead activity. The pain in his arm is not as severe, and is not consistent with C7 radiculopathy. Specifically, it involves the anterior forearm. This pain is very likely due to positioning. I again reviewed his CT scan, and I do agree with the radiologist that there is no compression in the region of the foramen at C6/7, and that there is no other area of left sided nerve compression. Also his blood sugars have been extremely high, and his insulin has been modified per diabetic coordinator's recommendations. The plan at this point is to observe patient overnight with discharge home tomorrow with HHPT. I have contacted patient's nurse case manager to keep her informed.Corey KitchenMarland Li

## 2018-07-17 NOTE — Progress Notes (Addendum)
Patient asleep in chair snoring at shift change. Awakened by tap and saying his name. Returned to assess multiple times during shift and patient would fall back asleep. At 9:25pm noted very drowsy with vitals check and cbg check. Oxygen sat 88% on room air patient awakened and told to take deep breaths noted93% on room air at that time. cbg 170. Attempted to help patient ambulate to bathroom and patient refused help pushing staff away and saying I dont need any help. Patient received IV robaxin, 2 percocet and 900 neurontin at 5pm on day shift. Patient ambulated to bathroom, refused assistance. Patient noted verbally aggressive as to staff awakening him. Explained to patient it is concerning to staff when he would not respond to tapping and talking to him. Explained that staff has to assess patient safety and make sure medically stable. Chair alarm in chair for patient safety and explained to patient. Will continue to monitor.  Olevia Perches

## 2018-07-17 NOTE — Evaluation (Signed)
Physical Therapy Evaluation Patient Details Name: Corey Li MRN: [id number] DOB: Sep 07, 1970 Today's Date: 07/17/2018   History of Present Illness  Pt is a 48 year old man with hx of DM and HTN, admitted for ACDF.   Clinical Impression  Pt admitted with/for ACDF.  Pt is at a min guard to mod I level  Due to pt's home situation and limited assist, will see pt daily if able to prepare for home..  Pt currently limited functionally due to the problems listed below.  (see problems list.)  Pt will benefit from PT to maximize function and safety to be able to get home safely with limited available assist.     Follow Up Recommendations Home health PT    Equipment Recommendations  (Rollator and c collar replacement pads.)    Recommendations for Other Services       Precautions / Restrictions Precautions Precautions: Cervical Precaution Booklet Issued: Yes (comment) Required Braces or Orthoses: Cervical Brace Cervical Brace: Hard collar      Mobility  Bed Mobility Overal bed mobility: Needs Assistance Bed Mobility: Rolling;Sidelying to Sit;Sit to Sidelying Rolling: Supervision Sidelying to sit: Min guard     Sit to sidelying: Min guard General bed mobility comments: pt improved with practice, but needs reinforcement  Transfers Overall transfer level: Modified independent               General transfer comment: slow to rise, uses UE's appropriately  Ambulation/Gait Ambulation/Gait assistance: Supervision Gait Distance (Feet): 250 Feet Assistive device: None(drifts to rails for occasional use) Gait Pattern/deviations: Step-through pattern   Gait velocity interpretation: <1.8 ft/sec, indicate of risk for recurrent falls General Gait Details: slow, guarded gait with ability to ambulate without AD, but feel the need to use rails on occasion.  Quick to fatigue.  Will need Rollator for longer distance and transport of personal items.  Stairs Stairs: Yes Stairs assistance:  Supervision Stair Management: One rail Right;Alternating pattern;Step to pattern;Forwards Number of Stairs: 5 General stair comments: safe with rails.  Forgets the sequencing, but still shows to be adequately safe.  Wheelchair Mobility    Modified Rankin (Stroke Patients Only)       Balance Overall balance assessment: No apparent balance deficits (not formally assessed)                                           Pertinent Vitals/Pain Pain Assessment: Faces Faces Pain Scale: Hurts whole lot Pain Location: L shoulder Pain Descriptors / Indicators: Aching Pain Intervention(s): Monitored during session    Home Living Family/patient expects to be discharged to:: Private residence Living Arrangements: Alone   Type of Home: Other(Comment)(Extended stay motel) Home Access: Level entry     Home Layout: One level Home Equipment: Grab bars - tub/shower      Prior Function Level of Independence: Independent         Comments: walks to stores and laundromat     Hand Dominance   Dominant Hand: Right    Extremity/Trunk Assessment        Lower Extremity Assessment Lower Extremity Assessment: Overall WFL for tasks assessed(mild proximal weakness bil)    Cervical / Trunk Assessment Cervical / Trunk Assessment: Kyphotic(with forward head)  Communication   Communication: HOH  Cognition Arousal/Alertness: Awake/alert Behavior During Therapy: WFL for tasks assessed/performed Overall Cognitive Status: Within Functional Limits for tasks assessed  General Comments General comments (skin integrity, edema, etc.): Asked for cervical brace replacement pads.  Asked for rollator for multiple reasons, but will be beneficial for transporting weighty personal items and to rest in.    Exercises     Assessment/Plan    PT Assessment Patient needs continued PT services  PT Problem List Decreased  strength;Decreased activity tolerance;Decreased mobility;Decreased knowledge of use of DME;Decreased knowledge of precautions;Pain       PT Treatment Interventions DME instruction;Gait training;Stair training;Functional mobility training;Therapeutic activities;Patient/family education    PT Goals (Current goals can be found in the Care Plan section)  Acute Rehab PT Goals Patient Stated Goal: pain relief PT Goal Formulation: With patient Time For Goal Achievement: 07/24/18 Potential to Achieve Goals: Good    Frequency Min 5X/week   Barriers to discharge        Co-evaluation               AM-PAC PT "6 Clicks" Mobility  Outcome Measure Help needed turning from your back to your side while in a flat bed without using bedrails?: A Little Help needed moving from lying on your back to sitting on the side of a flat bed without using bedrails?: A Little Help needed moving to and from a bed to a chair (including a wheelchair)?: A Little Help needed standing up from a chair using your arms (e.g., wheelchair or bedside chair)?: None Help needed to walk in hospital room?: None Help needed climbing 3-5 steps with a railing? : None 6 Click Score: 21    End of Session   Activity Tolerance: Patient tolerated treatment well Patient left: in chair;with call bell/phone within reach Nurse Communication: Mobility status PT Visit Diagnosis: Other abnormalities of gait and mobility (R26.89);Pain Pain - part of body: (neck, arm L)    Time: 7829-5621 PT Time Calculation (min) (ACUTE ONLY): 42 min   Charges:   PT Evaluation $PT Eval Low Complexity: 1 Low PT Treatments $Gait Training: 8-22 mins $Self Care/Home Management: 8-22        07/17/2018  Blythe Bing, PT Acute Rehabilitation Services [phone]  (pager) [phone]  (office)  Eliseo Gum Malajah Oceguera 07/17/2018, 4:37 PM

## 2018-07-17 NOTE — Anesthesia Postprocedure Evaluation (Signed)
Anesthesia Post Note  Patient: [name]  Procedure(s) Performed: Posterior Cervical Fusion/Foraminotomy Level 2 (Neck)     Patient location during evaluation: PACU Anesthesia Type: General Level of consciousness: awake and alert Pain management: pain level controlled Vital Signs Assessment: post-procedure vital signs reviewed and stable Respiratory status: spontaneous breathing, nonlabored ventilation, respiratory function stable and patient connected to nasal cannula oxygen Cardiovascular status: blood pressure returned to baseline and stable Postop Assessment: no apparent nausea or vomiting Anesthetic complications: no    Last Vitals:  Vitals:   07/17/18 0341 07/17/18 0859  BP: 139/88 119/74  Pulse: 93 87  Resp: 18 19  Temp: 37 C 37.5 C  SpO2: 94% 95%    Last Pain:  Vitals:   07/17/18 0905  TempSrc:   PainSc: 10-Worst pain ever                 [name]

## 2018-07-17 NOTE — Progress Notes (Signed)
    Patient doing well overall, has been ambulating Tolerating PO well Has been ambulating Patient described new ill-defined left shoulder pain, extending into the left arm. He states it started about 3 hours after yesterday's surgery   Physical Exam: Vitals:   07/16/18 2255 07/17/18 0341  BP: (!) 150/84 139/88  Pulse: 85 93  Resp: 18 18  Temp: 98 F (36.7 C) 98.6 F (37 C)  SpO2: 97% 94%    POD #1 s/p A/P cervical decompression and fusion, doing well, with ill-defined left shoulder and arm pain. This is likely due to positioning  - given new shoulder and arm pain, I will proceed with a CT of his C spine to evaluate left cervical nerve roots. I will call patient and nurse once I have reviewed results - encourage ambulation - Percocet for pain, Valium for muscle spasms - PT/OT today. Patient may be discharged home, depending on PT/OT input and CT images. He may need assistive devices per the recommendation of his therapists - patient's blood sugars are out of control. I did encourage him to discuss his very high blood sugars with his PCP ASAP upon discharge. He does know that his very high blood sugars do increase his risk of a nonunion, so I did stress the importance of getting this addressed. - Patient will be followed up in my office, either in person or via a phone call, in 2 weeks  * this was a telemedicine visit, due to the current coronavirus pandemic

## 2018-07-17 NOTE — Evaluation (Signed)
Occupational Therapy Evaluation Patient Details Name: Corey Li MRN: [id number] DOB: 12-Jan-1971 Today's Date: 07/17/2018    History of Present Illness Pt is a 48 year old man with hx of DM and HTN, admitted for ACDF.    Clinical Impression   Pt was functioning independently prior to admission. Presents with significant L shoulder pain and mild post operative cervical pain. Educated pt in use of AE and body mechanics related to cervical precautions and reinforced with handout. Awaiting results of CT of shoulder and neck. Did not offer any recommendations for shoulder exercise, will defer to MD.     Follow Up Recommendations  No OT follow up    Equipment Recommendations  None recommended by OT    Recommendations for Other Services       Precautions / Restrictions Precautions Precautions: Cervical Precaution Booklet Issued: Yes (comment) Required Braces or Orthoses: Cervical Brace Cervical Brace: Hard collar      Mobility Bed Mobility               General bed mobility comments: pt in chair, uncomfortable in bed, verbally educated in log roll technique  Transfers Overall transfer level: Modified independent               General transfer comment: slow to rise    Balance                                           ADL either performed or assessed with clinical judgement   ADL Overall ADL's : Needs assistance/impaired Eating/Feeding: Independent;Sitting   Grooming: Modified independent;Standing Grooming Details (indicate cue type and reason): educated in two cup method for oral care and to wash face with wash cloth Upper Body Bathing: Set up;Sitting;With adaptive equipment Upper Body Bathing Details (indicate cue type and reason): issued long handled bath sponge Lower Body Bathing: Modified independent;Sit to/from stand Lower Body Bathing Details (indicate cue type and reason): issued long handled bath sponge Upper Body Dressing : Set  up;Sitting   Lower Body Dressing: Modified independent;Sit to/from stand Lower Body Dressing Details (indicate cue type and reason): issued and educated in use of reacher, sock aid Toilet Transfer: Modified Independent;Ambulation   Toileting- Clothing Manipulation and Hygiene: Modified independent;Sit to/from stand       Functional mobility during ADLs: Modified independent General ADL Comments: educated in lifting precautions related to IADL     Vision Patient Visual Report: No change from baseline       Perception     Praxis      Pertinent Vitals/Pain Pain Assessment: 0-10 Pain Score: 8  Pain Location: L shoulder Pain Descriptors / Indicators: Aching Pain Intervention(s): Monitored during session;RN gave pain meds during session     Hand Dominance Right   Extremity/Trunk Assessment Upper Extremity Assessment Upper Extremity Assessment: RUE deficits/detail;LUE deficits/detail RUE Deficits / Details: 90 degrees FF, full elbow to hand RUE Coordination: decreased gross motor LUE Deficits / Details: limited shoulder AROM to 45 degrees due to pain, full ROM elbow to hand LUE Coordination: decreased gross motor       Cervical / Trunk Assessment Cervical / Trunk Assessment: Kyphotic(with forward head)   Communication Communication Communication: HOH   Cognition Arousal/Alertness: Awake/alert Behavior During Therapy: WFL for tasks assessed/performed Overall Cognitive Status: Within Functional Limits for tasks assessed  General Comments       Exercises     Shoulder Instructions      Home Living Family/patient expects to be discharged to:: Private residence Living Arrangements: Alone   Type of Home: Other(Comment)(Extended stay motel) Home Access: Level entry     Home Layout: One level     Bathroom Shower/Tub: Chief Strategy Officer: Standard     Home Equipment: Grab bars - tub/shower           Prior Functioning/Environment Level of Independence: Independent        Comments: walks to stores and laundromat        OT Problem List:        OT Treatment/Interventions: Self-care/ADL training    OT Goals(Current goals can be found in the care plan section) Acute Rehab OT Goals Patient Stated Goal: pain relief  OT Frequency:     Barriers to D/C:            Co-evaluation              AM-PAC OT "6 Clicks" Daily Activity     Outcome Measure Help from another person eating meals?: None Help from another person taking care of personal grooming?: None Help from another person toileting, which includes using toliet, bedpan, or urinal?: None Help from another person bathing (including washing, rinsing, drying)?: None Help from another person to put on and taking off regular upper body clothing?: None Help from another person to put on and taking off regular lower body clothing?: None 6 Click Score: 24   End of Session Equipment Utilized During Treatment: Gait belt;Cervical collar  Activity Tolerance: Patient tolerated treatment well Patient left: in chair;with call bell/phone within reach  OT Visit Diagnosis: Pain                Time: 4496-7591 OT Time Calculation (min): 30 min Charges:  OT General Charges $OT Visit: 1 Visit OT Evaluation $OT Eval Low Complexity: 1 Low OT Treatments $Self Care/Home Management : 8-22 mins  Corey Li, OTR/L Acute Rehabilitation Services Pager: [phone] Office: [phone]  Corey Li 07/17/2018, 9:44 AM

## 2018-07-17 NOTE — TOC Initial Note (Signed)
Transition of Care [facility]) - Initial/Assessment Note    Patient Details  Name: Corey Li MRN: [id number] Date of Birth: Oct 22, 1970  [facility] of [facility]) CM/SW Contact:    Doy Hutching, LCSWA Phone Number: 07/17/2018, 10:27 AM  Clinical Narrative:          CSW spoke with pt on telephone, introduced self, role, and reason for visit. Pt pleasant, oriented. From home alone, has family but does not speak with this, friend on contact list is in [place]. Pt states that he has a workers comp case worker named Carloyn Manner [phone]. He has had multiple surgeries but states he does not have any equipment, he states he thinks he may want a rollator. We discussed waiting for PT eval to better understand the pt needs.  Dr. Yevette Edwards was requesting the information for workers comp case manager, called and left message for MD with the above information. Continuing to follow.           Expected Discharge Plan: Home w Home Health Services Barriers to Discharge: Continued Medical Work up   Patient Goals and CMS Choice Patient states their goals for this hospitalization and ongoing recovery are:: to be able to get home, no more surgeries CMS Medicare.gov Compare Post Acute Care list provided to:: Patient Choice offered to / list presented to : Patient  Expected Discharge Plan and Services Expected Discharge Plan: Home w Home Health Services   Discharge Planning Services: CM Consult     Expected Discharge Date: 07/17/18                        Prior Living Arrangements/Services   Lives with:: Self Patient language and need for interpreter reviewed:: No Do you feel safe going back to the place where you live?: Yes      Need for Family Participation in Patient Care: No (Comment) Care giver support system in place?: No (comment)(pt contacts do not live in [place])   Criminal Activity/Legal Involvement Pertinent to Current Situation/Hospitalization: No - Comment as needed  Activities of  Daily Living      Permission Sought/Granted Permission sought to share information with : Case Manager Permission granted to share information with : Yes, Verbal Permission Granted  Share Information with NAME: Carloyn Manner     Permission granted to share info w Relationship: workers comp Sports coach  Permission granted to share info w Contact Information: [phone]  Emotional Assessment Appearance:: Appears stated age Attitude/Demeanor/Rapport: Engaged, Gracious Affect (typically observed): Accepting, Adaptable Orientation: : Oriented to Self, Oriented to Place, Oriented to  Time, Oriented to Situation Alcohol / Substance Use: Tobacco Use(current smoker) Psych Involvement: No (comment)  Admission diagnosis:  Aanterior Cervical Decompression C5-6, C6-7 Patient Active Problem List   Diagnosis Date Noted  . Radiculopathy 07/15/2018   PCP:  [name], NP Pharmacy:   [facility] [address] [place] - [address] [place] [address] Phone: [phone] Fax: [phone]  [facility] - [place], [place] - [address] [place] [address] Phone: [phone] Fax: [phone]     Social Determinants of Health (SDOH) Interventions    Readmission Risk Interventions No flowsheet data found.

## 2018-07-17 NOTE — Progress Notes (Signed)
Orthopedic Tech Progress Note Patient Details:  Corey Li 1970/04/30 [id number]  Ortho Devices Type of Ortho Device: [place] cervical collar Ortho Device/Splint Location: delivered to room at rns request. Ortho Device/Splint Interventions: Corey Li 07/17/2018, 7:18 PM

## 2018-07-17 NOTE — Progress Notes (Signed)
Received diabetes coordinator consult. Spoke with patient on the phone this am. Patient was diagnosed with diabetes 25 years ago and was on oral agents at that time for 1 year. Started on insulin after the first year and has been on insulin ever since. Had work accident on 05/07/18 and has had two neck surgeries in the last week. Still having pain in left shoulder.   States that he takes NPH 30 units every am, 20 units every pm at suppertime. May take Regular insulin 2-10 units SSI BID if CBG greater than 200 mg/dl. Also on Metformin. He gets his insulin from the [facility] and the health department and does have a PCP. States that he knows that his hgbA1C is 10.9%. states that it has been down to 7%, but since the accident, it has been up. He checks blood sugars usually about 2 times per day, but only gets a bottle of 50 strips per month from [facility].   Patient seems to know how to control his diabetes. He lives alone. States that he takes his insulin and metformin everyday.   Smith Mince RN BSN CDE Diabetes Coordinator Pager: [phone]  8am-5pm

## 2018-07-17 NOTE — Plan of Care (Signed)
  Problem: Safety: Goal: Ability to remain free from injury will improve Outcome: Progressing   Problem: Education: Goal: Knowledge of General Education information will improve Description Including pain rating scale, medication(s)/side effects and non-pharmacologic comfort measures Outcome: Progressing   Problem: Clinical Measurements: Goal: Ability to maintain clinical measurements within normal limits will improve Outcome: Progressing Goal: Will remain free from infection Outcome: Progressing Goal: Diagnostic test results will improve Outcome: Progressing Goal: Respiratory complications will improve Outcome: Progressing Goal: Cardiovascular complication will be avoided Outcome: Progressing   Problem: Activity: Goal: Risk for activity intolerance will decrease Outcome: Progressing   Problem: Pain Managment: Goal: General experience of comfort will improve Outcome: Progressing   Problem: Safety: Goal: Ability to remain free from injury will improve Outcome: Progressing   Problem: Skin Integrity: Goal: Risk for impaired skin integrity will decrease Outcome: Progressing   Problem: Education: Goal: Ability to verbalize activity precautions or restrictions will improve Outcome: Progressing Goal: Knowledge of the prescribed therapeutic regimen will improve Outcome: Progressing Goal: Understanding of discharge needs will improve Outcome: Progressing   Problem: Activity: Goal: Ability to avoid complications of mobility impairment will improve Outcome: Progressing Goal: Ability to tolerate increased activity will improve Outcome: Progressing Goal: Will remain free from falls Outcome: Progressing   Problem: Bowel/Gastric: Goal: Gastrointestinal status for postoperative course will improve Outcome: Progressing   Problem: Clinical Measurements: Goal: Ability to maintain clinical measurements within normal limits will improve Outcome: Progressing Goal: Postoperative  complications will be avoided or minimized Outcome: Progressing Goal: Diagnostic test results will improve Outcome: Progressing   Problem: Pain Management: Goal: Pain level will decrease Outcome: Progressing   Problem: Skin Integrity: Goal: Will show signs of wound healing Outcome: Progressing   Problem: Health Behavior/Discharge Planning: Goal: Identification of resources available to assist in meeting health care needs will improve Outcome: Progressing   Problem: Bladder/Genitourinary: Goal: Urinary functional status for postoperative course will improve Outcome: Progressing

## 2018-07-17 NOTE — Progress Notes (Signed)
I have reviewed patient's CT scan and called him to discuss my thoughts with him. The left C7 pedicle screw is slightly breached superiorly. This is not impinging on the left C7 nerve, and the screw is not located in the region of the foramen, however, given the patient's left arm pain, I do feel that it would be best to reposition the screw more inferiorly, in order to be entirely certain that we are doing all we can to address his left arm pain. I did discuss with the patient that the screw may or may not be the origin of his left arm pain, but he does state that his arm pain is severe at times, so I do feel that repositioning his left C7 screw would give him the greatest chance of alleviation of his left arm pain. Therefore we will tentatively proceed with a revision instrumentation tomorrow, and he will be made NPO after midnight tonight. In the event that his arm pain alleviates itself, we will instead not proceed with surgery.

## 2018-07-17 NOTE — Progress Notes (Signed)
Continues to sleep in chair, easily arousable. Refuses to get into bed to sleep. At times leaning forward or to side in chair. Chair alarm in place. Will continue to monitor patient.  Olevia Perches

## 2018-07-17 NOTE — Care Management (Signed)
PT/OT evaluations complete; recommendation is for HHPT and rollator for home.  Called [place], [facility] Case Manager at [phone] to discuss discharge arrangements.  Faxed clinical information and orders for DME and home health to her at [phone].  She states she will arrange [facility] and order DME for delivery to pt's home.   Case Manager given contact information for WE case manager, should she need additional information.    Quintella Baton, RN, BSN  Trauma/Neuro ICU Case Manager [phone]

## 2018-07-18 LAB — GLUCOSE, CAPILLARY
Glucose-Capillary: 135 mg/dL — ABNORMAL HIGH (ref 70–99)
Glucose-Capillary: 300 mg/dL — ABNORMAL HIGH (ref 70–99)

## 2018-07-18 MED ORDER — DIAZEPAM 5 MG PO TABS: 5.0000 mg | ORAL_TABLET | Freq: Four times a day (QID) | ORAL | 0 refills | Status: DC | PRN

## 2018-07-18 MED ORDER — OXYCODONE-ACETAMINOPHEN 5-325 MG PO TABS: 1.0000 | ORAL_TABLET | ORAL | 0 refills | Status: DC | PRN

## 2018-07-18 NOTE — Progress Notes (Signed)
Called to pt's room as he is threatening to leave AMA due to pain meds not being given. Advised pt that he has been difficult to arouse all night and RN is cautious due to O2 having to be applied. Pt states we continue to wake him up when he is trying to sleep. See primary RN's notes. RN advised that if he does leave AMA he will not receive prescriptions. Pt then stated that he would stay the rest of the night but does not want any other "services" tonight.

## 2018-07-18 NOTE — Progress Notes (Signed)
Patient not disturbed but seen in bed sleeping thru glass door. Can hear snoring coming from room.  Corey Li

## 2018-07-18 NOTE — Progress Notes (Signed)
    Patient tolerating PO well Has been ambulating Patient continues to report nondermatomal left shoulder pain, extending into left arm. This pain is circumferential, involving the anterior and posterior arm, not extending into hand. This pain is reproduced with elevation of left shoulder and with hand motions, such as holding his phone in his left hand.    Physical Exam: Vitals:   07/17/18 2329 07/18/18 0822  BP: 120/72 120/75  Pulse: 93 91  Resp: 16 16  Temp: 97.9 F (36.6 C) 98.9 F (37.2 C)  SpO2: 93% 91%    Pt s/p A/P cervical decompression and fusion, doing well, with ill-defined left shoulder and arm pain. This is likely due to positioning. The patient's CT is negative for nerve compression and his pain is nondermatomal.   - encourage ambulation - Percocet for pain, Valium for muscle spasms (both were sent to pharmacy) - d/c home today with f/u in 2 weeks - patient will need to discuss blood sugars with his PCP, as they are out of control. I encouraged patient to discuss this with PCP. He knows that his very high blood sugars increase his risk for a nonunion and infection.  - will keep an eye on his shoulder pain. This should improve in time. If is does not, a shoulder workup may be indicated.  * this was a telemedicine visit, due to the current coronavirus pandemic*

## 2018-07-18 NOTE — Progress Notes (Signed)
Patient can be seen in bed via glass door. See chest rising and falling. Patient not disturbed per his request. Corey Li

## 2018-07-18 NOTE — Progress Notes (Signed)
Patient continue to sleep in chair. Drool noted as leaning forward in chair. Awakened patient to have him lean back in chair. Oxygen 2LNC to maintain sats above 90% while sleeping. Patient requesting pain medication for shoulder pain. Explained to patient due to sleeping entire shift, need for oxygen this nurse did not feel comfortable giving patient any pain medications. Patient stated that he would leave hospital then if he will not be given any medications. Cristie Hem, Charge nurse aware. Patient in room using toilet at this time. Olevia Perches

## 2018-07-18 NOTE — Plan of Care (Signed)
  Problem: Safety: Goal: Ability to remain free from injury will improve Outcome: Progressing   Problem: Education: Goal: Knowledge of General Education information will improve Description Including pain rating scale, medication(s)/side effects and non-pharmacologic comfort measures Outcome: Progressing   Problem: Clinical Measurements: Goal: Ability to maintain clinical measurements within normal limits will improve Outcome: Progressing Goal: Will remain free from infection Outcome: Progressing Goal: Diagnostic test results will improve Outcome: Progressing Goal: Respiratory complications will improve Outcome: Progressing Goal: Cardiovascular complication will be avoided Outcome: Progressing   Problem: Activity: Goal: Risk for activity intolerance will decrease Outcome: Progressing   Problem: Pain Managment: Goal: General experience of comfort will improve Outcome: Progressing   Problem: Safety: Goal: Ability to remain free from injury will improve Outcome: Progressing   Problem: Skin Integrity: Goal: Risk for impaired skin integrity will decrease Outcome: Progressing   Problem: Education: Goal: Ability to verbalize activity precautions or restrictions will improve Outcome: Progressing Goal: Knowledge of the prescribed therapeutic regimen will improve Outcome: Progressing Goal: Understanding of discharge needs will improve Outcome: Progressing   Problem: Activity: Goal: Ability to avoid complications of mobility impairment will improve Outcome: Progressing Goal: Ability to tolerate increased activity will improve Outcome: Progressing Goal: Will remain free from falls Outcome: Progressing   Problem: Bowel/Gastric: Goal: Gastrointestinal status for postoperative course will improve Outcome: Progressing   Problem: Clinical Measurements: Goal: Ability to maintain clinical measurements within normal limits will improve Outcome: Progressing Goal: Postoperative  complications will be avoided or minimized Outcome: Progressing Goal: Diagnostic test results will improve Outcome: Progressing   Problem: Pain Management: Goal: Pain level will decrease Outcome: Progressing   Problem: Skin Integrity: Goal: Will show signs of wound healing Outcome: Progressing   Problem: Health Behavior/Discharge Planning: Goal: Identification of resources available to assist in meeting health care needs will improve Outcome: Progressing   Problem: Bladder/Genitourinary: Goal: Urinary functional status for postoperative course will improve Outcome: Progressing   

## 2018-07-18 NOTE — Progress Notes (Signed)
Patient reported to charge nurse that he would stay rest of the night but would like no one to come in his room. Noted that patient is refusing care and that staff will not disturb.  Corey Li

## 2018-07-18 NOTE — Care Management (Signed)
Received call from Carloyn Manner, [facility] Case Manager at [phone]. Provided her with pharmacy name and number scripts sent to, she will ensure they are covered. She will also set up transportation home for patient and set up [facility] and provide patient w DME. She was given nurse'e station number to call and speak with bedside nurse for more specific time of when patient will need ride home set up for. No further CM needs.

## 2018-07-18 NOTE — Progress Notes (Signed)
PT Cancellation Note  Patient Details Name: Corey Li MRN: [id number] DOB: 30-Jun-1970   Cancelled Treatment:    Reason Eval/Treat Not Completed: Patient at procedure or test/unavailable   Attempted to work with pt x 2 this morning (8:40 eating breakfast; 9:50 working with RN on discharge).  Noted MD has ordered rollator (RW with seat). Patient reports no further Pt needs at this time.    Scherrie November Glandorf, PT [phone] 07/18/2018, 9:51 AM

## 2018-07-22 ENCOUNTER — Emergency Department (HOSPITAL_COMMUNITY): Payer: Medicaid Other

## 2018-07-22 ENCOUNTER — Encounter (HOSPITAL_COMMUNITY): Payer: Self-pay | Admitting: Orthopedic Surgery

## 2018-07-22 ENCOUNTER — Inpatient Hospital Stay (HOSPITAL_COMMUNITY)
Admission: EM | Admit: 2018-07-22 | Discharge: 2018-08-02 | DRG: 981 | Disposition: A | Discharge status: Skilled Nursing Facility | Payer: Medicaid Other | Attending: Internal Medicine | Admitting: Internal Medicine

## 2018-07-22 ENCOUNTER — Other Ambulatory Visit: Payer: Self-pay

## 2018-07-22 ENCOUNTER — Inpatient Hospital Stay (HOSPITAL_COMMUNITY): Payer: Medicaid Other

## 2018-07-22 DIAGNOSIS — E1011 Type 1 diabetes mellitus with ketoacidosis with coma: Secondary | ICD-10-CM | POA: Diagnosis not present

## 2018-07-22 DIAGNOSIS — R471 Dysarthria and anarthria: Secondary | ICD-10-CM | POA: Diagnosis present

## 2018-07-22 DIAGNOSIS — I959 Hypotension, unspecified: Secondary | ICD-10-CM | POA: Diagnosis present

## 2018-07-22 DIAGNOSIS — X58XXXA Exposure to other specified factors, initial encounter: Secondary | ICD-10-CM | POA: Diagnosis not present

## 2018-07-22 DIAGNOSIS — H47619 Cortical blindness, unspecified side of brain: Secondary | ICD-10-CM | POA: Diagnosis not present

## 2018-07-22 DIAGNOSIS — E1142 Type 2 diabetes mellitus with diabetic polyneuropathy: Secondary | ICD-10-CM | POA: Diagnosis present

## 2018-07-22 DIAGNOSIS — I63413 Cerebral infarction due to embolism of bilateral middle cerebral arteries: Secondary | ICD-10-CM | POA: Diagnosis present

## 2018-07-22 DIAGNOSIS — I10 Essential (primary) hypertension: Secondary | ICD-10-CM | POA: Diagnosis present

## 2018-07-22 DIAGNOSIS — E11649 Type 2 diabetes mellitus with hypoglycemia without coma: Secondary | ICD-10-CM | POA: Diagnosis not present

## 2018-07-22 DIAGNOSIS — J9690 Respiratory failure, unspecified, unspecified whether with hypoxia or hypercapnia: Secondary | ICD-10-CM

## 2018-07-22 DIAGNOSIS — J9600 Acute respiratory failure, unspecified whether with hypoxia or hypercapnia: Secondary | ICD-10-CM | POA: Diagnosis not present

## 2018-07-22 DIAGNOSIS — E1111 Type 2 diabetes mellitus with ketoacidosis with coma: Secondary | ICD-10-CM | POA: Diagnosis present

## 2018-07-22 DIAGNOSIS — F329 Major depressive disorder, single episode, unspecified: Secondary | ICD-10-CM | POA: Diagnosis present

## 2018-07-22 DIAGNOSIS — K219 Gastro-esophageal reflux disease without esophagitis: Secondary | ICD-10-CM | POA: Diagnosis present

## 2018-07-22 DIAGNOSIS — Z716 Tobacco abuse counseling: Secondary | ICD-10-CM

## 2018-07-22 DIAGNOSIS — F4310 Post-traumatic stress disorder, unspecified: Secondary | ICD-10-CM | POA: Diagnosis present

## 2018-07-22 DIAGNOSIS — Z4659 Encounter for fitting and adjustment of other gastrointestinal appliance and device: Secondary | ICD-10-CM

## 2018-07-22 DIAGNOSIS — G9340 Encephalopathy, unspecified: Secondary | ICD-10-CM | POA: Diagnosis not present

## 2018-07-22 DIAGNOSIS — Z794 Long term (current) use of insulin: Secondary | ICD-10-CM

## 2018-07-22 DIAGNOSIS — F17210 Nicotine dependence, cigarettes, uncomplicated: Secondary | ICD-10-CM | POA: Diagnosis present

## 2018-07-22 DIAGNOSIS — J302 Other seasonal allergic rhinitis: Secondary | ICD-10-CM | POA: Diagnosis present

## 2018-07-22 DIAGNOSIS — G4733 Obstructive sleep apnea (adult) (pediatric): Secondary | ICD-10-CM | POA: Diagnosis present

## 2018-07-22 DIAGNOSIS — R29731 NIHSS score 31: Secondary | ICD-10-CM | POA: Diagnosis present

## 2018-07-22 DIAGNOSIS — E876 Hypokalemia: Secondary | ICD-10-CM | POA: Diagnosis present

## 2018-07-22 DIAGNOSIS — I6340 Cerebral infarction due to embolism of unspecified cerebral artery: Secondary | ICD-10-CM | POA: Diagnosis not present

## 2018-07-22 DIAGNOSIS — Z79890 Hormone replacement therapy (postmenopausal): Secondary | ICD-10-CM

## 2018-07-22 DIAGNOSIS — E875 Hyperkalemia: Secondary | ICD-10-CM | POA: Diagnosis present

## 2018-07-22 DIAGNOSIS — E039 Hypothyroidism, unspecified: Secondary | ICD-10-CM | POA: Diagnosis present

## 2018-07-22 DIAGNOSIS — G8191 Hemiplegia, unspecified affecting right dominant side: Secondary | ICD-10-CM | POA: Diagnosis present

## 2018-07-22 DIAGNOSIS — J9601 Acute respiratory failure with hypoxia: Secondary | ICD-10-CM | POA: Diagnosis present

## 2018-07-22 DIAGNOSIS — Z978 Presence of other specified devices: Secondary | ICD-10-CM

## 2018-07-22 DIAGNOSIS — N179 Acute kidney failure, unspecified: Secondary | ICD-10-CM | POA: Diagnosis present

## 2018-07-22 DIAGNOSIS — R1310 Dysphagia, unspecified: Secondary | ICD-10-CM

## 2018-07-22 DIAGNOSIS — T17890A Other foreign object in other parts of respiratory tract causing asphyxiation, initial encounter: Secondary | ICD-10-CM | POA: Diagnosis not present

## 2018-07-22 DIAGNOSIS — I63433 Cerebral infarction due to embolism of bilateral posterior cerebral arteries: Secondary | ICD-10-CM | POA: Diagnosis not present

## 2018-07-22 DIAGNOSIS — Z79899 Other long term (current) drug therapy: Secondary | ICD-10-CM

## 2018-07-22 DIAGNOSIS — D638 Anemia in other chronic diseases classified elsewhere: Secondary | ICD-10-CM | POA: Diagnosis present

## 2018-07-22 DIAGNOSIS — E785 Hyperlipidemia, unspecified: Secondary | ICD-10-CM | POA: Diagnosis present

## 2018-07-22 DIAGNOSIS — Z1159 Encounter for screening for other viral diseases: Secondary | ICD-10-CM | POA: Diagnosis not present

## 2018-07-22 DIAGNOSIS — Z01818 Encounter for other preprocedural examination: Secondary | ICD-10-CM | POA: Diagnosis not present

## 2018-07-22 DIAGNOSIS — Z981 Arthrodesis status: Secondary | ICD-10-CM

## 2018-07-22 DIAGNOSIS — G9341 Metabolic encephalopathy: Secondary | ICD-10-CM | POA: Diagnosis present

## 2018-07-22 DIAGNOSIS — J690 Pneumonitis due to inhalation of food and vomit: Secondary | ICD-10-CM | POA: Diagnosis present

## 2018-07-22 DIAGNOSIS — E0811 Diabetes mellitus due to underlying condition with ketoacidosis with coma: Secondary | ICD-10-CM | POA: Diagnosis not present

## 2018-07-22 DIAGNOSIS — R29810 Facial weakness: Secondary | ICD-10-CM | POA: Diagnosis present

## 2018-07-22 DIAGNOSIS — E1110 Type 2 diabetes mellitus with ketoacidosis without coma: Secondary | ICD-10-CM | POA: Diagnosis present

## 2018-07-22 LAB — URINALYSIS, ROUTINE W REFLEX MICROSCOPIC
Bacteria, UA: NONE SEEN
Bilirubin Urine: NEGATIVE
Glucose, UA: >=500 mg/dL — AB
Ketones, ur: 20 mg/dL — AB
Leukocytes,Ua: NEGATIVE
Nitrite: NEGATIVE
Protein, ur: 100 mg/dL — AB
Specific Gravity, Urine: 1.016 (ref 1.005–1.030)
pH: 5 (ref 5.0–8.0)

## 2018-07-22 LAB — DIFFERENTIAL
Abs Immature Granulocytes: 0.75 10*3/uL — ABNORMAL HIGH (ref 0.00–0.07)
Basophils Absolute: 0.2 10*3/uL — ABNORMAL HIGH (ref 0.0–0.1)
Basophils Relative: 1 %
Eosinophils Absolute: 0 10*3/uL (ref 0.0–0.5)
Eosinophils Relative: 0 %
Immature Granulocytes: 4 %
Lymphocytes Relative: 5 %
Lymphs Abs: 0.9 10*3/uL (ref 0.7–4.0)
Monocytes Absolute: 1 10*3/uL (ref 0.1–1.0)
Monocytes Relative: 6 %
Neutro Abs: 15.3 10*3/uL — ABNORMAL HIGH (ref 1.7–7.7)
Neutrophils Relative %: 84 %

## 2018-07-22 LAB — RESPIRATORY PANEL BY PCR

## 2018-07-22 LAB — POCT I-STAT 7, (LYTES, BLD GAS, ICA,H+H)
Acid-base deficit: 17 mmol/L — ABNORMAL HIGH (ref 0.0–2.0)
Bicarbonate: 9.8 mmol/L — ABNORMAL LOW (ref 20.0–28.0)
Calcium, Ion: 1.21 mmol/L (ref 1.15–1.40)
HCT: 41 % (ref 39.0–52.0)
Hemoglobin: 13.9 g/dL (ref 13.0–17.0)
O2 Saturation: 100 %
Potassium: 4.5 mmol/L (ref 3.5–5.1)
Sodium: 127 mmol/L — ABNORMAL LOW (ref 135–145)
TCO2: 11 mmol/L — ABNORMAL LOW (ref 22–32)
pCO2 arterial: 26.3 mmHg — ABNORMAL LOW (ref 32.0–48.0)
pH, Arterial: 7.178 — CL (ref 7.350–7.450)
pO2, Arterial: 361 mmHg — ABNORMAL HIGH (ref 83.0–108.0)

## 2018-07-22 LAB — POCT I-STAT EG7
Acid-base deficit: 26 mmol/L — ABNORMAL HIGH (ref 0.0–2.0)
Bicarbonate: 6.1 mmol/L — ABNORMAL LOW (ref 20.0–28.0)
Calcium, Ion: 1.15 mmol/L (ref 1.15–1.40)
HCT: 36 % — ABNORMAL LOW (ref 39.0–52.0)
Hemoglobin: 12.2 g/dL — ABNORMAL LOW (ref 13.0–17.0)
O2 Saturation: 81 %
Potassium: 5.9 mmol/L — ABNORMAL HIGH (ref 3.5–5.1)
Sodium: 123 mmol/L — ABNORMAL LOW (ref 135–145)
TCO2: 7 mmol/L — ABNORMAL LOW (ref 22–32)
pCO2, Ven: 29.9 mmHg — ABNORMAL LOW (ref 44.0–60.0)
pH, Ven: 6.917 — CL (ref 7.250–7.430)
pO2, Ven: 74 mmHg — ABNORMAL HIGH (ref 32.0–45.0)

## 2018-07-22 LAB — APTT: aPTT: 31 seconds (ref 24–36)

## 2018-07-22 LAB — COMPREHENSIVE METABOLIC PANEL
ALT: 35 U/L (ref 0–44)
AST: 40 U/L (ref 15–41)
Albumin: 4.1 g/dL (ref 3.5–5.0)
Alkaline Phosphatase: 121 U/L (ref 38–126)
BUN: 60 mg/dL — ABNORMAL HIGH (ref 6–20)
CO2: <7 mmol/L — ABNORMAL LOW (ref 22–32)
Calcium: 9.8 mg/dL (ref 8.9–10.3)
Chloride: 84 mmol/L — ABNORMAL LOW (ref 98–111)
Creatinine, Ser: 3 mg/dL — ABNORMAL HIGH (ref 0.61–1.24)
GFR calc Af Amer: 27 mL/min — ABNORMAL LOW (ref >60)
GFR calc non Af Amer: 23 mL/min — ABNORMAL LOW (ref >60)
Glucose, Bld: 958 mg/dL (ref 70–99)
Potassium: 6.1 mmol/L — ABNORMAL HIGH (ref 3.5–5.1)
Sodium: 127 mmol/L — ABNORMAL LOW (ref 135–145)
Total Bilirubin: 2.1 mg/dL — ABNORMAL HIGH (ref 0.3–1.2)
Total Protein: 8.1 g/dL (ref 6.5–8.1)

## 2018-07-22 LAB — BASIC METABOLIC PANEL
Anion gap: 25 — ABNORMAL HIGH (ref 5–15)
BUN: 62 mg/dL — ABNORMAL HIGH (ref 6–20)
CO2: 14 mmol/L — ABNORMAL LOW (ref 22–32)
Calcium: 8.9 mg/dL (ref 8.9–10.3)
Chloride: 94 mmol/L — ABNORMAL LOW (ref 98–111)
Creatinine, Ser: 2.67 mg/dL — ABNORMAL HIGH (ref 0.61–1.24)
GFR calc Af Amer: 31 mL/min — ABNORMAL LOW (ref >60)
GFR calc non Af Amer: 27 mL/min — ABNORMAL LOW (ref >60)
Glucose, Bld: 691 mg/dL (ref 70–99)
Potassium: 4.9 mmol/L (ref 3.5–5.1)
Sodium: 133 mmol/L — ABNORMAL LOW (ref 135–145)

## 2018-07-22 LAB — CBC
HCT: 35.8 % — ABNORMAL LOW (ref 39.0–52.0)
Hemoglobin: 10.7 g/dL — ABNORMAL LOW (ref 13.0–17.0)
MCH: 31.1 pg (ref 26.0–34.0)
MCHC: 29.9 g/dL — ABNORMAL LOW (ref 30.0–36.0)
MCV: 104.1 fL — ABNORMAL HIGH (ref 80.0–100.0)
Platelets: 482 10*3/uL — ABNORMAL HIGH (ref 150–400)
RBC: 3.44 MIL/uL — ABNORMAL LOW (ref 4.22–5.81)
RDW: 14 % (ref 11.5–15.5)
WBC: 18.2 10*3/uL — ABNORMAL HIGH (ref 4.0–10.5)
nRBC: 0 % (ref 0.0–0.2)

## 2018-07-22 LAB — TROPONIN I: Troponin I: <0.03 ng/mL (ref <0.03)

## 2018-07-22 LAB — GLUCOSE, CAPILLARY
Glucose-Capillary: >600 mg/dL (ref 70–99)
Glucose-Capillary: >600 mg/dL (ref 70–99)
Glucose-Capillary: >600 mg/dL (ref 70–99)
Glucose-Capillary: 483 mg/dL — ABNORMAL HIGH (ref 70–99)

## 2018-07-22 LAB — RAPID URINE DRUG SCREEN, HOSP PERFORMED
Amphetamines: NOT DETECTED
Barbiturates: NOT DETECTED
Benzodiazepines: POSITIVE — AB
Cocaine: NOT DETECTED
Opiates: NOT DETECTED
Tetrahydrocannabinol: NOT DETECTED

## 2018-07-22 LAB — PROCALCITONIN: Procalcitonin: 0.77 ng/mL

## 2018-07-22 LAB — ETHANOL
Alcohol, Ethyl (B): <10 mg/dL (ref <10)
Alcohol, Ethyl (B): <10 mg/dL (ref <10)

## 2018-07-22 LAB — MAGNESIUM: Magnesium: 2.6 mg/dL — ABNORMAL HIGH (ref 1.7–2.4)

## 2018-07-22 LAB — PROTIME-INR
INR: 1.2 (ref 0.8–1.2)
Prothrombin Time: 15.4 seconds — ABNORMAL HIGH (ref 11.4–15.2)

## 2018-07-22 LAB — PHOSPHORUS: Phosphorus: 5.8 mg/dL — ABNORMAL HIGH (ref 2.5–4.6)

## 2018-07-22 LAB — LACTIC ACID, PLASMA
Lactic Acid, Venous: 2.9 mmol/L (ref 0.5–1.9)
Lactic Acid, Venous: 2.2 mmol/L (ref 0.5–1.9)

## 2018-07-22 LAB — AMYLASE: Amylase: 172 U/L — ABNORMAL HIGH (ref 28–100)

## 2018-07-22 LAB — LIPASE, BLOOD: Lipase: 73 U/L — ABNORMAL HIGH (ref 11–51)

## 2018-07-22 LAB — ACETAMINOPHEN LEVEL: Acetaminophen (Tylenol), Serum: <10 ug/mL — ABNORMAL LOW (ref 10–30)

## 2018-07-22 LAB — BETA-HYDROXYBUTYRIC ACID: Beta-Hydroxybutyric Acid: >8 mmol/L — ABNORMAL HIGH (ref 0.05–0.27)

## 2018-07-22 LAB — SALICYLATE LEVEL: Salicylate Lvl: <7 mg/dL (ref 2.8–30.0)

## 2018-07-22 LAB — CBG MONITORING, ED: Glucose-Capillary: >600 mg/dL (ref 70–99)

## 2018-07-22 LAB — I-STAT CREATININE, ED: Creatinine, Ser: 2 mg/dL — ABNORMAL HIGH (ref 0.61–1.24)

## 2018-07-22 MED ORDER — ROCURONIUM BROMIDE 50 MG/5ML IV SOLN
INTRAVENOUS | Status: AC | PRN
  Administered 2018-07-22: 100 mg via INTRAVENOUS

## 2018-07-22 MED ORDER — MIDAZOLAM HCL 2 MG/2ML IJ SOLN: 2.0000 mg | INTRAMUSCULAR | Status: DC | PRN

## 2018-07-22 MED ORDER — PANTOPRAZOLE SODIUM 40 MG IV SOLR
40.0000 mg | Freq: Every day | INTRAVENOUS | Status: DC
  Administered 2018-07-22 – 2018-07-23 (×2): 40 mg via INTRAVENOUS
  Filled 2018-07-22 (×2): qty 40

## 2018-07-22 MED ORDER — INSULIN ASPART 100 UNIT/ML IV SOLN
10.0000 [IU] | Freq: Once | INTRAVENOUS | Status: AC
  Administered 2018-07-22: 10 [IU] via INTRAVENOUS

## 2018-07-22 MED ORDER — DEXTROSE-NACL 5-0.45 % IV SOLN
INTRAVENOUS | Status: DC
  Administered 2018-07-23: 06:00:00 via INTRAVENOUS

## 2018-07-22 MED ORDER — SODIUM CHLORIDE 0.9 % IV SOLN
INTRAVENOUS | Status: DC | PRN
  Administered 2018-07-22: 500 mL via INTRAVENOUS

## 2018-07-22 MED ORDER — ROCURONIUM BROMIDE 50 MG/5ML IV SOLN
100.0000 mg | Freq: Once | INTRAVENOUS | Status: DC
  Filled 2018-07-22: qty 10

## 2018-07-22 MED ORDER — INSULIN ASPART 100 UNIT/ML ~~LOC~~ SOLN
5.0000 [IU] | Freq: Once | SUBCUTANEOUS | Status: DC
  Administered 2018-07-22: 5 [IU] via SUBCUTANEOUS

## 2018-07-22 MED ORDER — BISACODYL 10 MG RE SUPP: 10.0000 mg | Freq: Every day | RECTAL | Status: DC | PRN

## 2018-07-22 MED ORDER — HYDRALAZINE HCL 20 MG/ML IJ SOLN
10.0000 mg | INTRAMUSCULAR | Status: DC | PRN
  Administered 2018-07-23: 10 mg via INTRAVENOUS
  Filled 2018-07-22: qty 1

## 2018-07-22 MED ORDER — LABETALOL HCL 5 MG/ML IV SOLN
10.0000 mg | Freq: Once | INTRAVENOUS | Status: AC
  Administered 2018-07-22: 10 mg via INTRAVENOUS
  Filled 2018-07-22: qty 4

## 2018-07-22 MED ORDER — INSULIN REGULAR(HUMAN) IN NACL 100-0.9 UT/100ML-% IV SOLN
INTRAVENOUS | Status: DC
  Administered 2018-07-22: 27 [IU]/h via INTRAVENOUS
  Administered 2018-07-22: 4.4 [IU]/h via INTRAVENOUS
  Administered 2018-07-23: 26.3 [IU]/h via INTRAVENOUS
  Administered 2018-07-23: 20.9 [IU]/h via INTRAVENOUS
  Filled 2018-07-22 (×4): qty 100

## 2018-07-22 MED ORDER — SODIUM CHLORIDE 0.9 % IV SOLN
2.0000 g | INTRAVENOUS | Status: DC
  Administered 2018-07-23: 2 g via INTRAVENOUS
  Filled 2018-07-22 (×2): qty 2

## 2018-07-22 MED ORDER — ETOMIDATE 2 MG/ML IV SOLN: 20.0000 mg | Freq: Once | INTRAVENOUS | Status: DC

## 2018-07-22 MED ORDER — SODIUM CHLORIDE 0.9 % IV SOLN: 1.0000 g | Freq: Once | INTRAVENOUS | Status: DC

## 2018-07-22 MED ORDER — LEVOTHYROXINE SODIUM 75 MCG PO TABS
175.0000 ug | ORAL_TABLET | Freq: Every day | ORAL | Status: DC
  Administered 2018-07-23 – 2018-08-02 (×9): 175 ug
  Filled 2018-07-22 (×9): qty 1

## 2018-07-22 MED ORDER — SODIUM BICARBONATE 8.4 % IV SOLN
50.0000 meq | Freq: Once | INTRAVENOUS | Status: AC
  Administered 2018-07-22: 50 meq via INTRAVENOUS
  Filled 2018-07-22: qty 50

## 2018-07-22 MED ORDER — VANCOMYCIN HCL 10 G IV SOLR
1750.0000 mg | INTRAVENOUS | Status: DC
  Filled 2018-07-22: qty 1750

## 2018-07-22 MED ORDER — VANCOMYCIN HCL 10 G IV SOLR
1750.0000 mg | Freq: Once | INTRAVENOUS | Status: AC
  Administered 2018-07-22: 1750 mg via INTRAVENOUS
  Filled 2018-07-22: qty 1750

## 2018-07-22 MED ORDER — IOHEXOL 350 MG/ML SOLN
100.0000 mL | Freq: Once | INTRAVENOUS | Status: AC | PRN
  Administered 2018-07-22: 100 mL via INTRAVENOUS

## 2018-07-22 MED ORDER — SODIUM CHLORIDE 0.9 % IV BOLUS
1000.0000 mL | Freq: Once | INTRAVENOUS | Status: AC
  Administered 2018-07-22: 1000 mL via INTRAVENOUS

## 2018-07-22 MED ORDER — ENOXAPARIN SODIUM 40 MG/0.4ML ~~LOC~~ SOLN
40.0000 mg | SUBCUTANEOUS | Status: DC
  Administered 2018-07-22 – 2018-07-28 (×7): 40 mg via SUBCUTANEOUS
  Filled 2018-07-22 (×7): qty 0.4

## 2018-07-22 MED ORDER — FENTANYL CITRATE (PF) 100 MCG/2ML IJ SOLN
100.0000 ug | INTRAMUSCULAR | Status: DC | PRN
  Administered 2018-07-24 (×4): 100 ug via INTRAVENOUS
  Filled 2018-07-22 (×5): qty 2

## 2018-07-22 MED ORDER — ALBUTEROL SULFATE (2.5 MG/3ML) 0.083% IN NEBU: 2.5000 mg | INHALATION_SOLUTION | RESPIRATORY_TRACT | Status: DC | PRN

## 2018-07-22 MED ORDER — DOCUSATE SODIUM 50 MG/5ML PO LIQD: 100.0000 mg | Freq: Two times a day (BID) | ORAL | Status: DC | PRN

## 2018-07-22 MED ORDER — SUCCINYLCHOLINE CHLORIDE 20 MG/ML IJ SOLN: 100.0000 mg | Freq: Once | INTRAMUSCULAR | Status: DC

## 2018-07-22 MED ORDER — LACTATED RINGERS IV BOLUS
1000.0000 mL | Freq: Once | INTRAVENOUS | Status: AC
  Administered 2018-07-22: 1000 mL via INTRAVENOUS

## 2018-07-22 MED ORDER — ETOMIDATE 2 MG/ML IV SOLN
INTRAVENOUS | Status: AC | PRN
  Administered 2018-07-22: 20 mg via INTRAVENOUS

## 2018-07-22 MED ORDER — STERILE WATER FOR INJECTION IV SOLN
INTRAVENOUS | Status: DC
  Administered 2018-07-22: 17:00:00 via INTRAVENOUS
  Filled 2018-07-22 (×5): qty 850

## 2018-07-22 MED ORDER — CHLORHEXIDINE GLUCONATE 0.12% ORAL RINSE (MEDLINE KIT)
15.0000 mL | Freq: Two times a day (BID) | OROMUCOSAL | Status: DC
  Administered 2018-07-22 – 2018-07-25 (×7): 15 mL via OROMUCOSAL

## 2018-07-22 MED ORDER — ONDANSETRON HCL 4 MG/2ML IJ SOLN: 4.0000 mg | Freq: Four times a day (QID) | INTRAMUSCULAR | Status: DC | PRN

## 2018-07-22 MED ORDER — SODIUM CHLORIDE 0.9 % IV SOLN
2.0000 g | Freq: Once | INTRAVENOUS | Status: AC
  Administered 2018-07-22: 2 g via INTRAVENOUS
  Filled 2018-07-22: qty 2

## 2018-07-22 MED ORDER — ORAL CARE MOUTH RINSE
15.0000 mL | OROMUCOSAL | Status: DC
  Administered 2018-07-22 – 2018-07-26 (×34): 15 mL via OROMUCOSAL

## 2018-07-22 MED ORDER — IPRATROPIUM-ALBUTEROL 0.5-2.5 (3) MG/3ML IN SOLN
3.0000 mL | Freq: Four times a day (QID) | RESPIRATORY_TRACT | Status: DC
  Administered 2018-07-22 – 2018-07-24 (×10): 3 mL via RESPIRATORY_TRACT
  Filled 2018-07-22 (×10): qty 3

## 2018-07-22 MED ORDER — ACETAMINOPHEN 325 MG PO TABS
650.0000 mg | ORAL_TABLET | ORAL | Status: DC | PRN
  Administered 2018-07-28 – 2018-08-01 (×6): 650 mg via ORAL
  Filled 2018-07-22 (×6): qty 2

## 2018-07-22 MED ORDER — CALCIUM GLUCONATE-NACL 1-0.675 GM/50ML-% IV SOLN
INTRAVENOUS | Status: AC
  Administered 2018-07-22: 1000 mg via INTRAVENOUS
  Filled 2018-07-22: qty 50

## 2018-07-22 MED ORDER — SODIUM CHLORIDE 0.9 % IV SOLN
INTRAVENOUS | Status: DC
  Administered 2018-07-22 – 2018-07-23 (×2): via INTRAVENOUS

## 2018-07-22 MED ORDER — LEVOTHYROXINE SODIUM 75 MCG PO TABS: 175.0000 ug | ORAL_TABLET | Freq: Every day | ORAL | Status: DC

## 2018-07-22 MED ORDER — DEXTROSE-NACL 5-0.45 % IV SOLN: INTRAVENOUS | Status: DC

## 2018-07-22 MED ORDER — FENTANYL CITRATE (PF) 100 MCG/2ML IJ SOLN
100.0000 ug | INTRAMUSCULAR | Status: AC | PRN
  Administered 2018-07-22 – 2018-07-23 (×3): 100 ug via INTRAVENOUS
  Filled 2018-07-22 (×3): qty 2

## 2018-07-22 MED ORDER — MIDAZOLAM HCL 2 MG/2ML IJ SOLN
2.0000 mg | INTRAMUSCULAR | Status: DC | PRN
  Administered 2018-07-22 – 2018-07-23 (×2): 2 mg via INTRAVENOUS
  Filled 2018-07-22 (×2): qty 2

## 2018-07-22 MED ORDER — CALCIUM GLUCONATE-NACL 1-0.675 GM/50ML-% IV SOLN
1.0000 g | Freq: Once | INTRAVENOUS | Status: AC
  Administered 2018-07-22: 1000 mg via INTRAVENOUS

## 2018-07-22 NOTE — ED Notes (Signed)
Attempted report x1. 

## 2018-07-22 NOTE — Code Documentation (Signed)
48yo male arriving to [facility] via EMS at 1214. Patient from a hotel where he resides. Patient with h/o DM and recent C5-7 ACDF. Today he was noted to be wandering around the hoted and EMS was called. EMS found patient unresponsive with stridor and hypoxia. Code stroke called on patient arrival due to reported right sided weakness. On stroke team arrival patient is unresponsive. Pupils 83mm and sluggish. CT head completed. Patient with audible stridor and work of breathing. Patient taken back to the ED and intubated by the EDP. Patient with profound acidosis and hyperglycemia on labs. CTA not initially completed due to Cr 3. Plans for MRI when able, however, patient unable to have MRI. Patient to CT for CTA which was negative for basilar artery occlusion per MD. No acute stroke treatment at this time. Bedside handoff with ED RN Morrie Sheldon.

## 2018-07-22 NOTE — Progress Notes (Signed)
Per Corey Li and Dr Craige Cotta patient does NOT meet criteria for r/o COVINT-19 testing at this time. Pt is not on airborne precautions.  ED RN transported Patient to [phone] under full COVID-19 precautions per the ED MD.  Pt is on droplet precautions for RSV pannel testing

## 2018-07-22 NOTE — Progress Notes (Signed)
Today's events noted. Patient's diagnosis of diabetic ketoacidosis noted. I certainly agree with treatment plan outlined by Dr. Craige Cotta, and I appreciate his roll in Mr. Corey Li' management.  Clearly, the patient is currently requiring close observation in the intensive care setting, as well as an insulin drip. Additional workup and treatment will be per the critical care team, however I will continue to follow the patient's progress closely.   From the standpoint of the patient's surgery, and cervical CAT scan  Is unremarkable, with appropriately positioned hardware, with no change noted, as compared to the patient's recent CAT scan following his surgery. Once it is medically appropriate to do so, I do feel that physical therapy and occupational therapy would be extremely helpful, and I do suspect that [name] will ultimately need to be transferred to a rehabilitation facility or a skilled nursing facility upon his discharge from the hospital.  Again, I will continue to follow. I would ask that the critical care team please contact me with any questions.Marland KitchenMarland Kitchen

## 2018-07-22 NOTE — ED Triage Notes (Signed)
Pt in from hotel, where he has been living for awhile after getting thyroid surgery, brought in by EMS. Per EMS, pt's home health RN saw him normal yesterday. Today, his nurse came by around 1130 and noticed he was more confused, called EMS. Fire dept noticed pt wandering around room, when EMS arrived, pt was asleep on bed, breathing heavily, arrived on NRB unresponsive, stridor breathing. RA sats 70%

## 2018-07-22 NOTE — ED Notes (Signed)
Patient transported to CT 

## 2018-07-22 NOTE — Progress Notes (Signed)
Patient transported to CT and then to 3M04 without any apparent complications.

## 2018-07-22 NOTE — H&P (Signed)
NAMEJullian Li, MRN:  [id number], DOB:  1970-06-06, LOS: 0 ADMISSION DATE:  07/22/2018, CONSULTATION DATE:  07/22/2018 REFERRING MD:  Dr. Silverio Lay, CHIEF COMPLAINT:  AMS  Brief History   48 yoM DMT2, s/p recent A/P-CDF C5- C7 by Ortho on 4/1 and 4/2 presenting from hotel room with AMS and hypoxic.  Some right sided weakness in ER, code stroke called, CTH neg.  CT cervical with stable postop changes.  Afebrile. Glucose 958.  Intubated for airway protection, hypotensive post intubation.  PCCM to admit.   History of present illness   Tobacco abuse, DM type 2, HTN, HLD, hypothyroid, sleep apnea, GERD, sleep, anxiety, depression  Past Medical History  HPI obtained from medical chart review as patient is intubated and sedated on mechanical ventilation.   48 year old male with history of tobacco abuse, DM type 2, HTN, HLD, hypothyroid, sleep apnea, GERD, anxiety, depression and recent A/P cervical decompression and fusion by Dr. Yevette Edwards on 4/1 and 4/2.  Was seen normal by home heath 4/7.  Found to be confused by home health today around 1130, calling EMS.  On EMS arrival he was hypoxic in the 70%'s and unresponsive, reported stridor.   On arrival to ER, patient was obtunded and noted weakness on his right side, prompting activating of code stroke.  His glucose was additionally noted high and he was initially hypertensive. He has been afebrile.  He was intubated for airway protection and after intubation noted to be hypotensive. He was given labetalol for his hypertension in ER.  Started on 2L IV.  Labs noted for ABG 6.917/ 29/ 74/ 6, corrected Na 141, K 6.1, Cl 84, CO2 <7, glucose 958, BUN 60, sCr 3 (1.46 on 3/30), t. Bili 2.1, WBC 18.2, Hgb 10.7, HCT 35.8, Plts 482.  CXR without obvious inflitrate. Unknown sick contacts.  PCCM called for admit.   Significant Hospital Events   4/1/ 4/2 A/P cervical decompression and fusion by Dr. Yevette Edwards  Consults:  Neurology   Procedures:  4/8 ETT >>  Significant  Diagnostic Tests:  4/8 [facility] code stroke >> neg  4/8 CT cervical  >>Image quality degraded by moderate motion.  Corpectomy at C6. Anterior and posterior fusion from C5 through C7.  Hardware in good position and unchanged from recent study.  Postop prevertebral soft tissue swelling related to recent surgery and unchanged from the prior study.  4/8 CXR >>Tube positions as described without pneumothorax. Note that nasogastric tube tip is at the gastroesophageal junction. Advise advancing nasogastric tube 8-10 cm. Areas of atelectatic change in the right mid lung and bilateral base regions. No consolidation. Heart size within normal limits.  Micro Data:  4/8 BC x 2 >> 4/8 UC >> 4/8 RVP >> 4/8 trach asp >>  Antimicrobials:  4/8 vanc >> 4/8 cefepime >>  Interim history/subjective:   Objective   Blood pressure (!) 90/49, pulse 81, temperature 98.2 F (36.8 C), temperature source Rectal, resp. rate (!) 0, height 5\' 9"  (1.753 m), SpO2 92 %.    Vent Mode: PRVC FiO2 (%):  [100 %] 100 % Set Rate:  [35 bmp] 35 bmp Vt Set:  [560 mL] 560 mL PEEP:  [5 cmH20] 5 cmH20 Plateau Pressure:  [23 cmH20] 23 cmH20  No intake or output data in the 24 hours ending 07/22/18 1424 There were no vitals filed for this visit.  Examination: General:  Critically ill adult male on MV in NAD HEENT: MM pink/moist, poor dentition, ETT at 48, pupils 4/reactive,  anicteric, anterior and posterior midline incisions approximated, no drainage  Neuro: unresponsive CV: RR, no murmur PULM: even/non-labored, lungs bilaterally clear ZO:XWRU, ND, +bs Extremities: warm/dry, no edema, ankle bracelet right ankle Skin: no rashes   Resolved Hospital Problem list    Assessment & Plan:   Acute hypoxic respiratory failure Tobacco abuse  - ? Stridor  - CXR with stable ETT, minimal bibasilar atelectasis, ? RML P:  Full MV support, PRVC 8 cc/kg, - auto peeping in ER which could contribute to patient's hypotension. Will need to  maximize MVe as patient tolerated Repeat ABG in 1 hr CXR in am  Wean FiO2/ PEEP for sat goal >94% VAP bundle Daily SBT if meets criteria/ ensure cuff leak prior to extubation Empiric airborne/ contact precautions  Will send RVP/ trach asp Discussed with Dr. Craige Cotta, not COVID r/o PAD Protocol with prn fent/ versed, once he starts to wake up can add home regimen  Acute encephalopathy most likely metabolic  R/o Stroke - presenting with AMS and right sided weakness (no weakness reported right sided weakness on discharge 4/4 ) P:  Neurology following Assess UDS, ETOH MRI brain ordered   DKA Hx DMT2 P:  Additional 2L IVF  Insulin gtt per protocol  Hourly CBG Assess beta-hydroxybutyric acid Pending UA  Hold home metformin/ insulin  Hypotension now, initially hypertensive P:  Additional fluids S/p labetalol May need central line Trend lactate  AKI Corrected Na 141 AGMA Hyperkalemia- pseudo due to acidosis  P:  Additional fluids now Bicarb gtt at 50 ml/hr  Repeat BMP at 2000 Add on mag / phos Trend BMP/ mag / phos/ UOP Renal dose meds    Leukocytosis without fever P:  Pan culture UA pending  Start empiric vanc/ cefepime, narrow as able  Assess PCT and trend   S/p A/ P CDF C5-C7, on 4/1/ 4/2 - CT cervical shows no fluid collections, stable postop prevertebral soft tissue swelling P:  Serial neuro checks  Hold home percocet   Hypothyroidism P:  Daily synthroid 175 mcg tab  Hx HTN, HLD Abnormal EKG P:  Repeat EKG and assess trop Hold home norvasc, lisinopril, and lasix  Restart home lipitor  Anxiety/ depression P:  Hold home prozac, valium,   GERD P:  PPI  Best practice:  Diet: NPO Pain/Anxiety/Delirium protocol (if indicated): prn fentanyl/ versed if needed  VAP protocol (if indicated): yes DVT prophylaxis: lovenox GI prophylaxis: PPI Glucose control: insulin gtt/ hourly CBG Mobility: BR Code Status: Full  Family Communication: no family.  A  friend is listed as NOK.  Disposition: ICU  Labs   CBC: Recent Labs  Lab 07/22/18 1234 07/22/18 1246  WBC 18.2*  --   NEUTROABS 15.3*  --   HGB 10.7* 12.2*  HCT 35.8* 36.0*  MCV 104.1*  --   PLT 482*  --     Basic Metabolic Panel: Recent Labs  Lab 07/22/18 1234 07/22/18 1246  NA 127* 123*  K 6.1* 5.9*  CL 84*  --   CO2 <7*  --   GLUCOSE 958*  --   BUN 60*  --   CREATININE 3.00* 2.00*  CALCIUM 9.8  --    GFR: Estimated Creatinine Clearance: 48.3 mL/min (A) (by C-G formula based on SCr of 2 mg/dL (H)). Recent Labs  Lab 07/22/18 1234  WBC 18.2*    Liver Function Tests: Recent Labs  Lab 07/22/18 1234  AST 40  ALT 35  ALKPHOS 121  BILITOT 2.1*  PROT 8.1  ALBUMIN  4.1   No results for input(s): LIPASE, AMYLASE in the last 168 hours. No results for input(s): AMMONIA in the last 168 hours.  ABG    Component Value Date/Time   HCO3 6.1 (L) 07/22/2018 1246   TCO2 7 (L) 07/22/2018 1246   ACIDBASEDEF 26.0 (H) 07/22/2018 1246   O2SAT 81.0 07/22/2018 1246     Coagulation Profile: Recent Labs  Lab 07/22/18 1234  INR 1.2    Cardiac Enzymes: No results for input(s): CKTOTAL, CKMB, CKMBINDEX, TROPONINI in the last 168 hours.  HbA1C: Hgb A1c MFr Bld  Date/Time Value Ref Range Status  07/13/2018 11:18 AM 10.9 (H) 4.8 - 5.6 % Final    Comment:    (NOTE) Pre diabetes:          5.7%-6.4% Diabetes:              >6.4% Glycemic control for   <7.0% adults with diabetes     CBG: Recent Labs  Lab 07/17/18 1645 07/17/18 2115 07/18/18 0647 07/18/18 1225 07/22/18 1228  GLUCAP 227* 170* 135* 300* >600*    Review of Systems:   unable  Past Medical History  He,  has a past medical history of Anxiety, Depression, Diabetes mellitus without complication (HCC), GERD (gastroesophageal reflux disease), H/O seasonal allergies, Headache, Hyperlipidemia, Hypertension, Hypothyroidism, Neuropathy, Numbness and tingling of both lower extremities, PTSD  (post-traumatic stress disorder), Sleep apnea, and Thyroid disease.   Surgical History    Past Surgical History:  Procedure Laterality Date  . ANTERIOR CERVICAL DECOMP/DISCECTOMY FUSION N/A 07/15/2018   Procedure: ANTERIOR CERVICAL DECOMPRESSION/DISCECTOMY FUSION TWO LEVELS WITH CERVICAL SIX CORPECTOMY;  Surgeon: Estill Bambergumonski, Mark, MD;  Location: [facility] OR;  Service: Orthopedics;  Laterality: N/A;  . HERNIA REPAIR     child  . POSTERIOR CERVICAL FUSION/FORAMINOTOMY  07/16/2018   Procedure: Posterior Cervical Fusion/Foraminotomy Level 2;  Surgeon: Estill Bambergumonski, Mark, MD;  Location: [facility] OR;  Service: Orthopedics;;     Social History   reports that he has been smoking cigarettes. He has a 10.00 pack-year smoking history. He has quit using smokeless tobacco. He reports previous alcohol use. He reports that he does not use drugs.   Family History   His family history is not on file.   Allergies No Known Allergies   Home Medications  Prior to Admission medications   Medication Sig Start Date End Date Taking? Authorizing Provider  amLODipine (NORVASC) 5 MG tablet Take 5 mg by mouth daily.    [provider]  atorvastatin (LIPITOR) 80 MG tablet Take 80 mg by mouth daily.     [provider]  diazepam (VALIUM) 5 MG tablet Take 1 tablet (5 mg total) by mouth every 6 (six) hours as needed for muscle spasms. 07/18/18   Estill Bambergumonski, Mark, MD  FLUoxetine (PROZAC) 10 MG capsule Take 10 mg by mouth daily.     [provider]  furosemide (LASIX) 40 MG tablet Take 40 mg by mouth 2 (two) times daily.    [provider]  gabapentin (NEURONTIN) 300 MG capsule Take 900-1,200 mg by mouth See admin instructions. 900 mg twice daily, and 1200 mg at bedtime ONLY AS NEEDED    [provider]  insulin NPH Human (HUMULIN N,NOVOLIN N) 100 UNIT/ML injection Inject 20-30 Units into the skin See admin instructions. 30 units in the morning, and 20 units in the evening    [provider]   insulin regular (NOVOLIN R,HUMULIN R) 100 units/mL injection Inject 2-10 Units into the skin 2 (  two) times daily. Per Sliding scale    [provider]  levothyroxine (SYNTHROID, LEVOTHROID) 175 MCG tablet Take 175 mcg by mouth daily before breakfast.    [provider]  lisinopril (PRINIVIL,ZESTRIL) 40 MG tablet Take 40 mg by mouth daily.    [provider]  metFORMIN (GLUCOPHAGE) 1000 MG tablet Take 1,000 mg by mouth 2 (two) times daily with a meal.    [provider]  omeprazole (PRILOSEC) 20 MG capsule Take 20 mg by mouth daily.     [provider]  oxyCODONE-acetaminophen (PERCOCET/ROXICET) 5-325 MG tablet Take 1-2 tablets by mouth every 4 (four) hours as needed for moderate pain or severe pain. 07/18/18   Estill Bamberg, MD     Critical care time: 50 mins     Posey Boyer, MSN, AGACNP-BC [facility] Pgr: [phone] or if no answer [phone] 07/22/2018, 2:24 PM

## 2018-07-22 NOTE — Progress Notes (Signed)
Pharmacy Antibiotic Note  Ronnald Service is a 48 y.o. male admitted on 07/22/2018 with sepsis. Pharmacy has been consulted for vancomycin and cefepime dosing. Pt is afebrile but WBC is elevated at 18.2. SCr is well above previous value at 3 and lactic acid is also elevated.   Plan: Vancomycin 1750mg  IV Q48H Cefepime 2gm IV Q24H F/u renal fxn, C&S, clinical status and peak/trough at SS  Height: 5\' 9"  (175.3 cm) Weight: 187 lb 6.3 oz (85 kg) IBW/kg (Calculated) : 70.7  Temp (24hrs), Avg:98.2 F (36.8 C), Min:98.2 F (36.8 C), Max:98.2 F (36.8 C)  Recent Labs  Lab 07/22/18 1234 07/22/18 1246 07/22/18 1331  WBC 18.2*  --   --   CREATININE 3.00* 2.00*  --   LATICACIDVEN  --   --  2.9*    Estimated Creatinine Clearance: 48.3 mL/min (A) (by C-G formula based on SCr of 2 mg/dL (H)).    No Known Allergies  Antimicrobials this admission: Vanc 4/8>> Cefepime 4/8>>  Dose adjustments this admission: N/A  Microbiology results: Pending  Thank you for allowing pharmacy to be a part of this patient's care.  Rachael Zapanta, Drake Leach 07/22/2018 3:10 PM

## 2018-07-22 NOTE — ED Notes (Signed)
ED TO INPATIENT HANDOFF REPORT  ED Nurse Name and Phone #: [phone]  S Name/Age/Gender Corey Li 48 y.o. male Room/Bed: 035C/035C  Code Status   Code Status: Prior  Home/SNF/Other Home Patient oriented to: none; GCS  of 3 Is this baseline? No   Triage Complete: Triage complete  Chief Complaint AMS  Triage Note Pt in from hotel, where he has been living for awhile after getting thyroid surgery, brought in by EMS. Per EMS, pt's home health RN saw him normal yesterday. Today, his nurse came by around 1130 and noticed he was more confused, called EMS. Fire dept noticed pt wandering around room, when EMS arrived, pt was asleep on bed, breathing heavily, arrived on NRB unresponsive, stridor breathing. RA sats 70%   Allergies No Known Allergies  Level of Care/Admitting Diagnosis ED Disposition    ED Disposition Condition Comment   Admit  Hospital Area: [facility] [[id number]]  Level of Care: ICU [6]  Diagnosis: DKA (diabetic ketoacidoses) [facility]) [[id number]]  Admitting Physician: Efraim Kaufmann  Attending Physician: Alyson Reedy [phone]  Estimated length of stay: 3 - 4 days  Certification:: I certify this patient will need inpatient services for at least 2 midnights  Bed request comments: COVID 19 r/l low risk  PT Class (Do Not Modify): Inpatient [101]  PT Acc Code (Do Not Modify): Private [1]       B Medical/Surgery History Past Medical History:  Diagnosis Date  . Anxiety   . Depression   . Diabetes mellitus without complication (HCC)    Type II  . GERD (gastroesophageal reflux disease)   . H/O seasonal allergies   . Headache   . Hyperlipidemia   . Hypertension   . Hypothyroidism   . Neuropathy   . Numbness and tingling of both lower extremities   . PTSD (post-traumatic stress disorder)   . Sleep apnea   . Thyroid disease    Past Surgical History:  Procedure Laterality Date  . ANTERIOR CERVICAL DECOMP/DISCECTOMY FUSION N/A 07/15/2018    Procedure: ANTERIOR CERVICAL DECOMPRESSION/DISCECTOMY FUSION TWO LEVELS WITH CERVICAL SIX CORPECTOMY;  Surgeon: Estill Bamberg, MD;  Location: [facility] OR;  Service: Orthopedics;  Laterality: N/A;  . HERNIA REPAIR     child  . POSTERIOR CERVICAL FUSION/FORAMINOTOMY  07/16/2018   Procedure: Posterior Cervical Fusion/Foraminotomy Level 2;  Surgeon: Estill Bamberg, MD;  Location: [facility] OR;  Service: Orthopedics;;     A IV Location/Drains/Wounds Patient Lines/Drains/Airways Status   Active Line/Drains/Airways    Name:   Placement date:   Placement time:   Site:   Days:   Peripheral IV 07/22/18 Left Antecubital   07/22/18    1238    Antecubital   less than 1   Peripheral IV 07/22/18 Right Forearm   07/22/18    1239    Forearm   less than 1   Airway 7.5 mm   07/22/18    1325     less than 1   Incision (Closed) 07/15/18 Neck Other (Comment)   07/15/18    1451     7   Incision (Closed) 07/16/18 Neck   07/16/18    1053     6          Intake/Output Last 24 hours  Intake/Output Summary (Last 24 hours) at 07/22/2018 1427 Last data filed at 07/22/2018 1344 Gross per 24 hour  Intake 50 ml  Output -  Net 50 ml    Labs/Imaging Results for orders placed  or performed during the hospital encounter of 07/22/18 (from the past 48 hour(s))  CBG monitoring, ED     Status: Abnormal   Collection Time: 07/22/18 12:28 PM  Result Value Ref Range   Glucose-Capillary >600 (HH) 70 - 99 mg/dL  Ethanol     Status: None   Collection Time: 07/22/18 12:34 PM  Result Value Ref Range   Alcohol, Ethyl (B) <10 <10 mg/dL    Comment: (NOTE) Lowest detectable limit for serum alcohol is 10 mg/dL. For medical purposes only. Performed at [facility], [address], [place], [place] [address]   Protime-INR     Status: Abnormal   Collection Time: 07/22/18 12:34 PM  Result Value Ref Range   Prothrombin Time 15.4 (H) 11.4 - 15.2 seconds   INR 1.2 0.8 - 1.2    Comment: (NOTE) INR goal varies based on device and disease  states. Performed at [facility], [address], [place], [place] [address]   APTT     Status: None   Collection Time: 07/22/18 12:34 PM  Result Value Ref Range   aPTT 31 24 - 36 seconds    Comment: Performed at [facility], [address], [place], [place] [address]  CBC     Status: Abnormal   Collection Time: 07/22/18 12:34 PM  Result Value Ref Range   WBC 18.2 (H) 4.0 - 10.5 K/uL   RBC 3.44 (L) 4.22 - 5.81 MIL/uL   Hemoglobin 10.7 (L) 13.0 - 17.0 g/dL   HCT 00.8 (L) 67.6 - 19.5 %   MCV 104.1 (H) 80.0 - 100.0 fL   MCH 31.1 26.0 - 34.0 pg   MCHC 29.9 (L) 30.0 - 36.0 g/dL   RDW 09.3 26.7 - 12.4 %   Platelets 482 (H) 150 - 400 K/uL   nRBC 0.0 0.0 - 0.2 %    Comment: Performed at [facility], [address], [place], [place] [address]  Differential     Status: Abnormal   Collection Time: 07/22/18 12:34 PM  Result Value Ref Range   Neutrophils Relative % 84 %   Neutro Abs 15.3 (H) 1.7 - 7.7 K/uL   Lymphocytes Relative 5 %   Lymphs Abs 0.9 0.7 - 4.0 K/uL   Monocytes Relative 6 %   Monocytes Absolute 1.0 0.1 - 1.0 K/uL   Eosinophils Relative 0 %   Eosinophils Absolute 0.0 0.0 - 0.5 K/uL   Basophils Relative 1 %   Basophils Absolute 0.2 (H) 0.0 - 0.1 K/uL   Immature Granulocytes 4 %   Abs Immature Granulocytes 0.75 (H) 0.00 - 0.07 K/uL    Comment: Performed at [facility], [address] [place], [place] [address]  Comprehensive metabolic panel     Status: Abnormal   Collection Time: 07/22/18 12:34 PM  Result Value Ref Range   Sodium 127 (L) 135 - 145 mmol/L   Potassium 6.1 (H) 3.5 - 5.1 mmol/L   Chloride 84 (L) 98 - 111 mmol/L   CO2 <7 (L) 22 - 32 mmol/L   Glucose, Bld 958 (HH) 70 - 99 mg/dL    Comment: CRITICAL RESULT CALLED TO, READ BACK BY AND VERIFIED WITH: Shelsea Hangartner,A RN @ 1333 07/22/18 LEONARD,A    BUN 60 (H) 6 - 20 mg/dL   Creatinine, Ser 5.05 (H) 0.61 - 1.24 mg/dL   Calcium 9.8 8.9 - 39.7 mg/dL   Total Protein 8.1 6.5 - 8.1 g/dL    Albumin  4.1 3.5 - 5.0 g/dL   AST 40 15 - 41 U/L   ALT 35 0 - 44 U/L   Alkaline Phosphatase 121 38 - 126 U/L   Total Bilirubin 2.1 (H) 0.3 - 1.2 mg/dL   GFR calc non Af Amer 23 (L) >60 mL/min   GFR calc Af Amer 27 (L) >60 mL/min   Anion gap NOT CALCULATED 5 - 15    Comment: Performed at [facility], [address] [place], [place] [address]  Urinalysis, Routine w reflex microscopic     Status: Abnormal   Collection Time: 07/22/18 12:34 PM  Result Value Ref Range   Color, Urine YELLOW YELLOW   APPearance HAZY (A) CLEAR   Specific Gravity, Urine 1.016 1.005 - 1.030   pH 5.0 5.0 - 8.0   Glucose, UA >=500 (A) NEGATIVE mg/dL   Hgb urine dipstick MODERATE (A) NEGATIVE   Bilirubin Urine NEGATIVE NEGATIVE   Ketones, ur 20 (A) NEGATIVE mg/dL   Protein, ur 914 (A) NEGATIVE mg/dL   Nitrite NEGATIVE NEGATIVE   Leukocytes,Ua NEGATIVE NEGATIVE   RBC / HPF 0-5 0 - 5 RBC/hpf   WBC, UA 0-5 0 - 5 WBC/hpf   Bacteria, UA NONE SEEN NONE SEEN   Squamous Epithelial / LPF 0-5 0 - 5   Mucus PRESENT    Hyaline Casts, UA PRESENT    Amorphous Crystal PRESENT     Comment: Performed at [facility], [address] [place], [place] [address]  Salicylate level     Status: None   Collection Time: 07/22/18 12:34 PM  Result Value Ref Range   Salicylate Lvl <7.0 2.8 - 30.0 mg/dL    Comment: Performed at [facility], [address], [place], [place] [address]  Acetaminophen level     Status: Abnormal   Collection Time: 07/22/18 12:34 PM  Result Value Ref Range   Acetaminophen (Tylenol), Serum <10 (L) 10 - 30 ug/mL    Comment: (NOTE) Therapeutic concentrations vary significantly. A range of 10-30 ug/mL  may be an effective concentration for many patients. However, some  are best treated at concentrations outside of this range. Acetaminophen concentrations >150 ug/mL at 4 hours after ingestion  and >50 ug/mL at 12 hours after ingestion are often associated with  toxic  reactions. Performed at [facility], [address], [place], [place] [address]   I-stat Creatinine, ED     Status: Abnormal   Collection Time: 07/22/18 12:46 PM  Result Value Ref Range   Creatinine, Ser 2.00 (H) 0.61 - 1.24 mg/dL  POCT I-Stat EG7     Status: Abnormal   Collection Time: 07/22/18 12:46 PM  Result Value Ref Range   pH, Ven 6.917 (LL) 7.250 - 7.430   pCO2, Ven 29.9 (L) 44.0 - 60.0 mmHg   pO2, Ven 74.0 (H) 32.0 - 45.0 mmHg   Bicarbonate 6.1 (L) 20.0 - 28.0 mmol/L   TCO2 7 (L) 22 - 32 mmol/L   O2 Saturation 81.0 %   Acid-base deficit 26.0 (H) 0.0 - 2.0 mmol/L   Sodium 123 (L) 135 - 145 mmol/L   Potassium 5.9 (H) 3.5 - 5.1 mmol/L   Calcium, Ion 1.15 1.15 - 1.40 mmol/L   HCT 36.0 (L) 39.0 - 52.0 %   Hemoglobin 12.2 (L) 13.0 - 17.0 g/dL   Patient temperature HIDE    Sample type VENOUS    Comment NOTIFIED PHYSICIAN    Ct Cervical Spine Wo Contrast  Result Date: 07/22/2018 CLINICAL DATA:  Recent neck surgery 07/16/2018. Neck pain with radiculopathy. EXAM: CT CERVICAL SPINE WITHOUT CONTRAST TECHNIQUE: Multidetector CT imaging of the cervical spine was performed without intravenous contrast. Multiplanar CT image reconstructions were also generated. COMPARISON:  CT cervical spine 07/17/2018, MRI 06/29/2018 FINDINGS: Alignment: Normal Image quality degraded by moderate motion. Skull base and vertebrae: Negative for fracture Soft tissues and spinal canal: Prevertebral soft tissue swelling is unchanged most prominent at C4 through C6 and measuring approximately 12 mm in thickness. This is likely edema and fluid related to recent anterior cervical fusion. Improvement in soft tissue gas in the left neck and prevertebral soft tissues related to recent anterior fusion. Spinal canal not well evaluated due to streak artifact and patient motion Disc levels: C6 corpectomy with strut graft. Anterior plate fusion extending from C5 through C7. Posterior screw and rod fusion C5 through C7  bilaterally. Hardware in satisfactory position and unchanged from the prior CT. Upper chest: Lung apices clear bilaterally. Other: None IMPRESSION: Image quality degraded by moderate motion Corpectomy at C6. Anterior and posterior fusion from C5 through C7. Hardware in good position and unchanged from recent study. Postop prevertebral soft tissue swelling related to recent surgery and unchanged from the prior study. Electronically Signed   By: Marlan Palauharles  Clark M.D.   On: 07/22/2018 13:21   Dg Chest Port 1 View  Result Date: 07/22/2018 CLINICAL DATA:  Hypoxia and altered mental status EXAM: PORTABLE CHEST 1 VIEW COMPARISON:  None. FINDINGS: Endotracheal tube tip is 5.7 cm above the carina. Nasogastric tube tip is at the gastroesophageal junction. No pneumothorax. There is atelectatic change in the lung bases as well as in the right mid lung. There is no frank edema or consolidation. Heart size and pulmonary vascularity are normal. No adenopathy. No bone lesions. IMPRESSION: Tube positions as described without pneumothorax. Note that nasogastric tube tip is at the gastroesophageal junction. Advise advancing nasogastric tube 8-10 cm. Areas of atelectatic change in the right mid lung and bilateral base regions. No consolidation. Heart size within normal limits. Electronically Signed   By: Bretta BangWilliam  Woodruff III M.D.   On: 07/22/2018 13:59   Dg Abd Portable 1v  Result Date: 07/22/2018 CLINICAL DATA:  Orogastric tube placement. Altered mental status and hypoxia EXAM: PORTABLE ABDOMEN - 1 VIEW COMPARISON:  None. FINDINGS: Orogastric tube tip is at the gastroesophageal junction with the side port in the distal esophagus. There is moderate stool in the colon. There is no bowel dilatation or air-fluid level to suggest bowel obstruction. No free air. There is bibasilar lung atelectatic change. IMPRESSION: Orogastric tube tip at the gastroesophageal junction. Advise advancing orogastric tube 8-10 cm. No bowel obstruction or  free air evident. Bibasilar lung atelectatic change. Electronically Signed   By: Bretta BangWilliam  Woodruff III M.D.   On: 07/22/2018 14:00   Ct Head Code Stroke Wo Contrast`  Result Date: 07/22/2018 CLINICAL DATA:  Code stroke.  Altered level of consciousness. EXAM: CT HEAD WITHOUT CONTRAST TECHNIQUE: Contiguous axial images were obtained from the base of the skull through the vertex without intravenous contrast. COMPARISON:  MRI head 06/29/2018, CT head 05/07/2018 FINDINGS: Brain: Ventricle size normal. Negative for acute infarct, hemorrhage, mass. No edema or midline shift. Vascular: Negative for hyperdense vessel Skull: Negative Sinuses/Orbits: Negative Other: None ASPECTS (Alberta Stroke Program Early CT Score) - Ganglionic level infarction (caudate, lentiform nuclei, internal capsule, insula, M1-M3 cortex): 7 - Supraganglionic infarction (M4-M6 cortex): 3 Total score (0-10 with 10 being normal): 10 IMPRESSION:  1. No acute abnormality 2. ASPECTS is 10 Electronically Signed   By: Marlan Palau M.D.   On: 07/22/2018 13:00    Pending Labs Unresulted Labs (From admission, onward)    Start     Ordered   07/22/18 1430  Blood gas, arterial  Once,   R     07/22/18 1359   07/22/18 1414  Beta-hydroxybutyric acid  Add-on,   R     07/22/18 1413   07/22/18 1414  Respiratory Panel by PCR  (Respiratory virus panel with precautions)  ONCE - STAT,   R     07/22/18 1413   07/22/18 1331  Lactic acid, plasma  Now then every 2 hours,   STAT     07/22/18 1330   07/22/18 1331  Blood culture (routine x 2)  BLOOD CULTURE X 2,   STAT     07/22/18 1330   07/22/18 1234  Urine rapid drug screen (hosp performed)  ONCE - STAT,   STAT     07/22/18 1235          Vitals/Pain Today's Vitals   07/22/18 1353 07/22/18 1401 07/22/18 1412 07/22/18 1424  BP: (!) 88/48 (!) 90/49    Pulse: 80 81    Resp: (!) 0 (!) 0    Temp:      TempSrc:      SpO2: 100% 100% 92%   Weight:    85 kg  Height:      PainSc:    0-No pain     Isolation Precautions Airborne and Contact precautions  Medications Medications  etomidate (AMIDATE) injection 20 mg (has no administration in time range)  rocuronium (ZEMURON) injection 100 mg (has no administration in time range)  insulin regular, human (MYXREDLIN) 100 units/ 100 mL infusion (has no administration in time range)  dextrose 5 %-0.45 % sodium chloride infusion (has no administration in time range)  lactated ringers bolus 1,000 mL (has no administration in time range)  sodium bicarbonate 150 mEq in sterile water 1,000 mL infusion (has no administration in time range)  labetalol (NORMODYNE,TRANDATE) injection 10 mg (10 mg Intravenous Given 07/22/18 1242)  insulin aspart (novoLOG) injection 10 Units (10 Units Intravenous Given 07/22/18 1313)  sodium bicarbonate injection 50 mEq (50 mEq Intravenous Given 07/22/18 1314)  sodium chloride 0.9 % bolus 1,000 mL (1,000 mLs Intravenous New Bag/Given 07/22/18 1305)  calcium gluconate 1 g/ 50 mL sodium chloride IVPB (0 g Intravenous Stopped 07/22/18 1344)  etomidate (AMIDATE) injection (20 mg Intravenous Given 07/22/18 1317)  rocuronium (ZEMURON) injection (100 mg Intravenous Given 07/22/18 1318)    Mobility walks High fall risk   Focused Assessments Neuro Assessment Handoff:  Swallow screen pass? n/a     Last date known well: 07/21/18 Last time known well: (unclear) Neuro Assessment:   Neuro Checks:      Last Documented NIHSS Modified Score:   Has TPA been given? No If patient is a Neuro Trauma and patient is going to OR before floor call report to 4N Charge nurse: [phone] or [phone]     R Recommendations: See Admitting Provider Note  Report given to:   Additional Notes: n/a

## 2018-07-22 NOTE — Consult Note (Signed)
Neurology Consultation  Reason for Consult: Confusion followed by obtunded Referring Physician: Silverio Li  History is obtained from: RN/EMS  HPI: Corey Li is a 48 y.o. male with history of thyroid disease, sleep apnea, PTSD, neuropathy, hypertension, hyperlipidemia, diabetes.  He recently had a ACDF at the level of C5-C7.  At this point he is postop day 6.  Patient currently lives at a hotel and has home health.  Patient was last seen normal yesterday on 07/21/2018.  Today at approximately 11:30 AM he was noted to be confused and wandering the hallways of the hotel.  EMS was called and found patient very altered and obtunded.  Patient was brought to the emergency department where he was obtunded and rhonchorous [name] with saturations of 70 needing a nonrebreather to bring his saturations of oxygen up to 90.  It was found that his glucose was 600 and he had elevated lactic acid.  There was question of right-sided weakness.  Per nurse when patient came to the ED he had urinated himself but prior to that did state he needed go to the bathroom.   ED course : Patient was brought to ER room.  Neurology was consulted.  Upon entering the room patient was obtunded and rhonchorous and snoring.  Patient was immediately brought to the CT , where CT of head was obtained.  Due to airway obstruction patient was then brought back to the room for intubation.   Chart review: As noted patient on 06/16/2018 had a ACDF of C5-7.   LKW: 07/22/2018 but unknown exact time tpa given?: no, out of window Premorbid modified Rankin scale (mRS): 0 NIH stroke score 31     ROS:  Unable to obtain due to altered mental status.   Past Medical History:  Diagnosis Date  . Anxiety   . Depression   . Diabetes mellitus without complication (HCC)    Type II  . GERD (gastroesophageal reflux disease)   . H/O seasonal allergies   . Headache   . Hyperlipidemia   . Hypertension   . Hypothyroidism   . Neuropathy   . Numbness  and tingling of both lower extremities   . PTSD (post-traumatic stress disorder)   . Sleep apnea   . Thyroid disease      As patient is obtunded family history was unable to be obtained.   Social History:   reports that he has been smoking cigarettes. He has a 10.00 pack-year smoking history. He has quit using smokeless tobacco. He reports previous alcohol use. He reports that he does not use drugs.  Medications  Current Facility-Administered Medications:  .  dextrose 5 %-0.45 % sodium chloride infusion, , Intravenous, Continuous, Charlynne Pander, MD .  etomidate (AMIDATE) injection 20 mg, 20 mg, Intravenous, Once, Charlynne Pander, MD .  insulin regular, human (MYXREDLIN) 100 units/ 100 mL infusion, , Intravenous, Continuous, Charlynne Pander, MD .  rocuronium [facility]) injection 100 mg, 100 mg, Intravenous, Once, Charlynne Pander, MD  Current Outpatient Medications:  .  amLODipine (NORVASC) 5 MG tablet, Take 5 mg by mouth daily., Disp: , Rfl:  .  atorvastatin (LIPITOR) 80 MG tablet, Take 80 mg by mouth daily. , Disp: , Rfl:  .  diazepam (VALIUM) 5 MG tablet, Take 1 tablet (5 mg total) by mouth every 6 (six) hours as needed for muscle spasms., Disp: 40 tablet, Rfl: 0 .  FLUoxetine (PROZAC) 10 MG capsule, Take 10 mg by mouth daily. , Disp: , Rfl:  .  furosemide (LASIX) 40 MG tablet, Take 40 mg by mouth 2 (two) times daily., Disp: , Rfl:  .  gabapentin (NEURONTIN) 300 MG capsule, Take 900-1,200 mg by mouth See admin instructions. 900 mg twice daily, and 1200 mg at bedtime ONLY AS NEEDED, Disp: , Rfl:  .  insulin NPH Human (HUMULIN N,NOVOLIN N) 100 UNIT/ML injection, Inject 20-30 Units into the skin See admin instructions. 30 units in the morning, and 20 units in the evening, Disp: , Rfl:  .  insulin regular (NOVOLIN R,HUMULIN R) 100 units/mL injection, Inject 2-10 Units into the skin 2 (two) times daily. Per Sliding scale, Disp: , Rfl:  .  levothyroxine (SYNTHROID, LEVOTHROID)  175 MCG tablet, Take 175 mcg by mouth daily before breakfast., Disp: , Rfl:  .  lisinopril (PRINIVIL,ZESTRIL) 40 MG tablet, Take 40 mg by mouth daily., Disp: , Rfl:  .  metFORMIN (GLUCOPHAGE) 1000 MG tablet, Take 1,000 mg by mouth 2 (two) times daily with a meal., Disp: , Rfl:  .  omeprazole (PRILOSEC) 20 MG capsule, Take 20 mg by mouth daily. , Disp: , Rfl:  .  oxyCODONE-acetaminophen (PERCOCET/ROXICET) 5-325 MG tablet, Take 1-2 tablets by mouth every 4 (four) hours as needed for moderate pain or severe pain., Disp: 40 tablet, Rfl: 0   Exam: Current vital signs: BP (!) 77/58   Pulse 81   Temp 98.2 F (36.8 C) (Rectal)   Resp (!) 25   Ht 5\' 9"  (1.753 m)   SpO2 100%   BMI 27.67 kg/m  Vital signs in last 24 hours: Temp:  [98.2 F (36.8 C)] 98.2 F (36.8 C) (04/08 1236) Pulse Rate:  [43-114] 81 (04/08 1345) Resp:  [0-35] 25 (04/08 1345) BP: (77-196)/(48-106) 77/58 (04/08 1345) SpO2:  [83 %-100 %] 100 % (04/08 1345) FiO2 (%):  [100 %] 100 % (04/08 1325)  Physical Exam  Constitutional: Appears well-developed and well-nourished.  Psych: Obtunded Eyes: No scleral injection HENT: No OP obstrucion Head: Normocephalic.  Cardiovascular: Normal rate and regular rhythm.  Respiratory: Effort normal, non-labored breathing GI: Soft.  No distension. There is no tenderness.  Skin: WDI  Neuro: Mental Status: Patient is obtunded follows no commands.  Does not blink to threat.  Does not withdraw from pain.  Needed to be intubated secondary to airway protection Cranial Nerves: II: No blink to threat III,IV, VI: Minimal doll's blood present, corneals present.  Pupils 4 are equal, round, and sluggishly reactive to light.   V: Facial sensation-no response VII: Facial  is symmetric.  VIII: No response  Motor: No response to noxious stimuli.  Does not move any of his limbs however there is no increased rigidity Sensory: No response to noxious stimuli Deep Tendon Reflexes: Absent  throughout Plantars: Toes are downgoing bilaterally.  Cerebellar: Unable to test   Labs I have reviewed labs in epic and the results pertinent to this consultation are:   CBC    Component Value Date/Time   WBC 18.2 (H) 07/22/2018 1234   RBC 3.44 (L) 07/22/2018 1234   HGB 12.2 (L) 07/22/2018 1246   HCT 36.0 (L) 07/22/2018 1246   PLT 482 (H) 07/22/2018 1234   MCV 104.1 (H) 07/22/2018 1234   MCH 31.1 07/22/2018 1234   MCHC 29.9 (L) 07/22/2018 1234   RDW 14.0 07/22/2018 1234   LYMPHSABS 0.9 07/22/2018 1234   MONOABS 1.0 07/22/2018 1234   EOSABS 0.0 07/22/2018 1234   BASOSABS 0.2 (H) 07/22/2018 1234    CMP     Component Value  Date/Time   NA 123 (L) 07/22/2018 1246   K 5.9 (H) 07/22/2018 1246   CL 84 (L) 07/22/2018 1234   CO2 <7 (L) 07/22/2018 1234   GLUCOSE 958 (HH) 07/22/2018 1234   BUN 60 (H) 07/22/2018 1234   CREATININE 2.00 (H) 07/22/2018 1246   CALCIUM 9.8 07/22/2018 1234   PROT 8.1 07/22/2018 1234   ALBUMIN 4.1 07/22/2018 1234   AST 40 07/22/2018 1234   ALT 35 07/22/2018 1234   ALKPHOS 121 07/22/2018 1234   BILITOT 2.1 (H) 07/22/2018 1234   GFRNONAA 23 (L) 07/22/2018 1234   GFRAA 27 (L) 07/22/2018 1234    Lipid Panel  No results found for: CHOL, TRIG, HDL, CHOLHDL, VLDL, LDLCALC, LDLDIRECT   Imaging I have reviewed the images obtained:  CT-scan of the brain- no acute abnormalities  MRI examination of the brain-pending  CT scan of cervical spine- corpectomy at C6.  Anterior and posterior fusion from C5-C7.  Hardware in good position and unchanged from recent study  Felicie Morn PA-C [facility] [phone]  M-F  (9:00 am- 5:00 PM)  07/22/2018, 2:00 PM  NEUROHOSPITALIST ADDENDUM Performed a face to face diagnostic evaluation time patient was stroke alerted.  I have reviewed the contents of history and physical exam as documented by PA/ARNP/Resident and agree with above documentation.  I have discussed and formulated the above plan as  documented. Edits to the note have been made as needed.    Assessment:  48 year old male with recent ACDF at C5 C7 level presented to the ED for " altered mental status".  Was evaluated by EDP who felt right side was weaker than the left and patient was lethargic and neurology consulted for possible stroke.  She was stroke alerted and was immediately taken to CT scan, CT head ruled out hemorrhage. On examination patient was obtunded, not withdrawing in any extremity and impaired gag reflex. Patient needed intubation secondary to airway protection.  Patient had multiple metabolic issues such as elevated glucose of 600, elevated lactic acid, white blood cell count of 18, centimeters of 5.9, sodium of 126.  Initially's CTA was not performed due to patient's impaired renal function and plan was to obtain stat MRI brain and MRA head-to rule out basilar artery thrombosis given patient's poor neurological exam.  While his entire neurological exam could be explained by his metabolic state, his impaired gag reflex was concerning. Unfortunately, as patient could not be ruled out for COVID-19 and intubated-MRI department was unable to perform a stat MRI.  A stat CTA head and neck was performed which ruled out basilar artery thrombus given consideration of risk versus benefit, as missing a basilar artery thrombus could potentially be fatal.  Diabetic ketoacidosis Respiratory failure with compromised airway Acute metabolic encephalopathy Comatose on presentation  Recommendations Continue to treat metabolic disturbances Routine EEG patient does not improve despite correction of severe hyperglycemia, acidosis Neurochecks MRI brain when patient extubated or COVID 19 ruled out.     This patient is neurologically critically ill due to coma on presentation.   He is at risk for significant risk of neurological worsening from cerebral edema, heart failure, infection, respiratory failure and seizure. This  patient's care requires constant monitoring of vital signs, hemodynamics, respiratory and cardiac monitoring, review of multiple databases, neurological assessment, discussion with family, other specialists and medical decision making of high complexity.  I spent 65 minutes of neurocritical time in the care of this patient.      Georgiana Spinner Aroor MD [facility]  [phone]   If 7pm to 7am, please call on call as listed on AMION.

## 2018-07-22 NOTE — ED Provider Notes (Signed)
[facility] Provider Note   CSN: [id number] Arrival date & time: 07/22/18  1214    History   Chief Complaint Chief Complaint  Patient presents with   Altered Mental Status   Hyperglycemia    HPI Corey Li is a 48 y.o. male hx of DM, depression, recent cervical fusion here presenting with altered mental status.  Patient had C5-7 anterior cervical discectomy and is postoperative day 6.  Patient lives in a hotel currently and has home health that comes and visits him.  About 11:30 AM, he was noted to be confused and wandering in the hallway of the hotel by fire department.  When EMS got there, patient was obtunded and very altered.  No code stroke was activated by EMS.  Patient appears to have somnolent respirations and is unable to give any history.  Patient appears very weak on the right side as well. Also CBG read HI in triage.      The history is provided by the EMS personnel and a caregiver.  Level V caveat- AMS   Past Medical History:  Diagnosis Date   Anxiety    Depression    Diabetes mellitus without complication (HCC)    Type II   GERD (gastroesophageal reflux disease)    H/O seasonal allergies    Headache    Hyperlipidemia    Hypertension    Hypothyroidism    Neuropathy    Numbness and tingling of both lower extremities    PTSD (post-traumatic stress disorder)    Sleep apnea    Thyroid disease     Patient Active Problem List   Diagnosis Date Noted   DKA (diabetic ketoacidoses) (HCC) 07/22/2018   Radiculopathy 07/15/2018    Past Surgical History:  Procedure Laterality Date   ANTERIOR CERVICAL DECOMP/DISCECTOMY FUSION N/A 07/15/2018   Procedure: ANTERIOR CERVICAL DECOMPRESSION/DISCECTOMY FUSION TWO LEVELS WITH CERVICAL SIX CORPECTOMY;  Surgeon: Estill Bambergumonski, Mark, MD;  Location: [facility] OR;  Service: Orthopedics;  Laterality: N/A;   HERNIA REPAIR     child   POSTERIOR CERVICAL FUSION/FORAMINOTOMY  07/16/2018     Procedure: Posterior Cervical Fusion/Foraminotomy Level 2;  Surgeon: Estill Bambergumonski, Mark, MD;  Location: [facility] OR;  Service: Orthopedics;;        Home Medications    Prior to Admission medications   Medication Sig Start Date End Date Taking? Authorizing Provider  amLODipine (NORVASC) 5 MG tablet Take 5 mg by mouth daily.    [provider]  atorvastatin (LIPITOR) 80 MG tablet Take 80 mg by mouth daily.     [provider]  diazepam (VALIUM) 5 MG tablet Take 1 tablet (5 mg total) by mouth every 6 (six) hours as needed for muscle spasms. 07/18/18   Estill Bambergumonski, Mark, MD  FLUoxetine (PROZAC) 10 MG capsule Take 10 mg by mouth daily.     [provider]  furosemide (LASIX) 40 MG tablet Take 40 mg by mouth 2 (two) times daily.    [provider]  gabapentin (NEURONTIN) 300 MG capsule Take 900-1,200 mg by mouth See admin instructions. 900 mg twice daily, and 1200 mg at bedtime ONLY AS NEEDED    [provider]  insulin NPH Human (HUMULIN N,NOVOLIN N) 100 UNIT/ML injection Inject 20-30 Units into the skin See admin instructions. 30 units in the morning, and 20 units in the evening    [provider]  insulin regular (NOVOLIN R,HUMULIN R) 100 units/mL injection Inject 2-10 Units into the skin 2 (two) times daily.  Per Sliding scale    [provider]  levothyroxine (SYNTHROID, LEVOTHROID) 175 MCG tablet Take 175 mcg by mouth daily before breakfast.    [provider]  lisinopril (PRINIVIL,ZESTRIL) 40 MG tablet Take 40 mg by mouth daily.    [provider]  metFORMIN (GLUCOPHAGE) 1000 MG tablet Take 1,000 mg by mouth 2 (two) times daily with a meal.    [provider]  omeprazole (PRILOSEC) 20 MG capsule Take 20 mg by mouth daily.     [provider]  oxyCODONE-acetaminophen (PERCOCET/ROXICET) 5-325 MG tablet Take 1-2 tablets by mouth every 4 (four) hours as needed for moderate pain or severe pain. 07/18/18   Estill Bamberg, MD    Family History No family history on file.  Social History Social History   Tobacco Use   Smoking status: Current Every Day Smoker    Packs/day: 0.40    Years: 25.00    Pack years: 10.00    Types: Cigarettes   Smokeless tobacco: Former Neurosurgeon   Tobacco comment: has chewed in past  Substance Use Topics   Alcohol use: Not Currently   Drug use: No     Allergies   Patient has no known allergies.   Review of Systems Review of Systems  Psychiatric/Behavioral: Positive for confusion.  All other systems reviewed and are negative.    Physical Exam Updated Vital Signs BP 125/67    Pulse 82    Temp 98.2 F (36.8 C) (Rectal)    Resp (!) 26    Ht 5\' 9"  (1.753 m)    Wt 85 kg    SpO2 100%    BMI 27.67 kg/m   Physical Exam Vitals signs and nursing note reviewed.  Constitutional:      Comments: Confused, somnolent, responsive only to pain   HENT:     Head: Normocephalic.     Nose: Nose normal.     Mouth/Throat:     Mouth: Mucous membranes are dry.  Eyes:     Comments: Pupils 4 mm and nonreactive   Neck:     Comments: Surgical scan in anterior cervical area that is healing well  Cardiovascular:     Comments: Tachycardic  Pulmonary:     Effort: Pulmonary effort is normal.  Abdominal:     General: Abdomen is flat.  Musculoskeletal: Normal range of motion.  Skin:    General: Skin is warm.  Neurological:     Comments: Confused, somnolent, unable to arouse. Obvious R sided weakness, not following commands   Psychiatric:     Comments: Unable       ED Treatments / Results  Labs (all labs ordered are listed, but only abnormal results are displayed) Labs Reviewed  PROTIME-INR - Abnormal; Notable for the following components:      Result Value   Prothrombin Time 15.4 (*)    All other components within normal limits  CBC - Abnormal; Notable for the following components:   WBC 18.2 (*)    RBC 3.44 (*)    Hemoglobin 10.7 (*)    HCT 35.8 (*)    MCV  104.1 (*)    MCHC 29.9 (*)    Platelets 482 (*)    All other components within normal limits  DIFFERENTIAL - Abnormal; Notable for the following components:   Neutro Abs 15.3 (*)    Basophils Absolute 0.2 (*)    Abs Immature Granulocytes 0.75 (*)    All other components within normal limits  COMPREHENSIVE METABOLIC  PANEL - Abnormal; Notable for the following components:   Sodium 127 (*)    Potassium 6.1 (*)    Chloride 84 (*)    CO2 <7 (*)    Glucose, Bld 958 (*)    BUN 60 (*)    Creatinine, Ser 3.00 (*)    Total Bilirubin 2.1 (*)    GFR calc non Af Amer 23 (*)    GFR calc Af Amer 27 (*)    All other components within normal limits  RAPID URINE DRUG SCREEN, HOSP PERFORMED - Abnormal; Notable for the following components:   Benzodiazepines POSITIVE (*)    All other components within normal limits  URINALYSIS, ROUTINE W REFLEX MICROSCOPIC - Abnormal; Notable for the following components:   APPearance HAZY (*)    Glucose, UA >=500 (*)    Hgb urine dipstick MODERATE (*)    Ketones, ur 20 (*)    Protein, ur 100 (*)    All other components within normal limits  ACETAMINOPHEN LEVEL - Abnormal; Notable for the following components:   Acetaminophen (Tylenol), Serum <10 (*)    All other components within normal limits  LACTIC ACID, PLASMA - Abnormal; Notable for the following components:   Lactic Acid, Venous 2.9 (*)    All other components within normal limits  CBG MONITORING, ED - Abnormal; Notable for the following components:   Glucose-Capillary >600 (*)    All other components within normal limits  I-STAT CREATININE, ED - Abnormal; Notable for the following components:   Creatinine, Ser 2.00 (*)    All other components within normal limits  POCT I-STAT EG7 - Abnormal; Notable for the following components:   pH, Ven 6.917 (*)    pCO2, Ven 29.9 (*)    pO2, Ven 74.0 (*)    Bicarbonate 6.1 (*)    TCO2 7 (*)    Acid-base deficit 26.0 (*)    Sodium 123 (*)    Potassium 5.9 (*)     HCT 36.0 (*)    Hemoglobin 12.2 (*)    All other components within normal limits  CULTURE, BLOOD (ROUTINE X 2)  CULTURE, BLOOD (ROUTINE X 2)  RESPIRATORY PANEL BY PCR  URINE CULTURE  ETHANOL  APTT  SALICYLATE LEVEL  LACTIC ACID, PLASMA  BETA-HYDROXYBUTYRIC ACID  HIV ANTIBODY (ROUTINE TESTING W REFLEX)  LEGIONELLA PNEUMOPHILA SEROGP 1 UR AG  PROCALCITONIN  AMYLASE  LIPASE, BLOOD  HEPATITIS PANEL, ACUTE  MAGNESIUM  PHOSPHORUS  BLOOD GAS, ARTERIAL  ETHANOL  TROPONIN I  BASIC METABOLIC PANEL  BASIC METABOLIC PANEL    EKG EKG Interpretation  Date/Time:  Wednesday July 22 2018 12:21:22 EDT Ventricular Rate:  123 PR Interval:    QRS Duration: 111 QT Interval:  317 QTC Calculation: 454 R Axis:   -43 Text Interpretation:  Sinus or ectopic atrial tachycardia Incomplete RBBB and LAFB RSR' in V1 or V2, right VCD or RVH Probable anteroseptal infarct, old ST elevation, consider inferior injury Rate faster Confirmed by Richardean Canal [phone]) on 07/22/2018 1:33:54 PM   Radiology Ct Cervical Spine Wo Contrast  Result Date: 07/22/2018 CLINICAL DATA:  Recent neck surgery 07/16/2018. Neck pain with radiculopathy. EXAM: CT CERVICAL SPINE WITHOUT CONTRAST TECHNIQUE: Multidetector CT imaging of the cervical spine was performed without intravenous contrast. Multiplanar CT image reconstructions were also generated. COMPARISON:  CT cervical spine 07/17/2018, MRI 06/29/2018 FINDINGS: Alignment: Normal Image quality degraded by moderate motion. Skull base and vertebrae: Negative for fracture Soft tissues and spinal canal: Prevertebral soft tissue  swelling is unchanged most prominent at C4 through C6 and measuring approximately 12 mm in thickness. This is likely edema and fluid related to recent anterior cervical fusion. Improvement in soft tissue gas in the left neck and prevertebral soft tissues related to recent anterior fusion. Spinal canal not well evaluated due to streak artifact and patient  motion Disc levels: C6 corpectomy with strut graft. Anterior plate fusion extending from C5 through C7. Posterior screw and rod fusion C5 through C7 bilaterally. Hardware in satisfactory position and unchanged from the prior CT. Upper chest: Lung apices clear bilaterally. Other: None IMPRESSION: Image quality degraded by moderate motion Corpectomy at C6. Anterior and posterior fusion from C5 through C7. Hardware in good position and unchanged from recent study. Postop prevertebral soft tissue swelling related to recent surgery and unchanged from the prior study. Electronically Signed   By: Marlan Palau M.D.   On: 07/22/2018 13:21   Dg Chest Port 1 View  Result Date: 07/22/2018 CLINICAL DATA:  Hypoxia and altered mental status EXAM: PORTABLE CHEST 1 VIEW COMPARISON:  None. FINDINGS: Endotracheal tube tip is 5.7 cm above the carina. Nasogastric tube tip is at the gastroesophageal junction. No pneumothorax. There is atelectatic change in the lung bases as well as in the right mid lung. There is no frank edema or consolidation. Heart size and pulmonary vascularity are normal. No adenopathy. No bone lesions. IMPRESSION: Tube positions as described without pneumothorax. Note that nasogastric tube tip is at the gastroesophageal junction. Advise advancing nasogastric tube 8-10 cm. Areas of atelectatic change in the right mid lung and bilateral base regions. No consolidation. Heart size within normal limits. Electronically Signed   By: Bretta Bang III M.D.   On: 07/22/2018 13:59   Dg Abd Portable 1v  Result Date: 07/22/2018 CLINICAL DATA:  Orogastric tube placement. Altered mental status and hypoxia EXAM: PORTABLE ABDOMEN - 1 VIEW COMPARISON:  None. FINDINGS: Orogastric tube tip is at the gastroesophageal junction with the side port in the distal esophagus. There is moderate stool in the colon. There is no bowel dilatation or air-fluid level to suggest bowel obstruction. No free air. There is bibasilar lung  atelectatic change. IMPRESSION: Orogastric tube tip at the gastroesophageal junction. Advise advancing orogastric tube 8-10 cm. No bowel obstruction or free air evident. Bibasilar lung atelectatic change. Electronically Signed   By: Bretta Bang III M.D.   On: 07/22/2018 14:00   Ct Head Code Stroke Wo Contrast`  Result Date: 07/22/2018 CLINICAL DATA:  Code stroke.  Altered level of consciousness. EXAM: CT HEAD WITHOUT CONTRAST TECHNIQUE: Contiguous axial images were obtained from the base of the skull through the vertex without intravenous contrast. COMPARISON:  MRI head 06/29/2018, CT head 05/07/2018 FINDINGS: Brain: Ventricle size normal. Negative for acute infarct, hemorrhage, mass. No edema or midline shift. Vascular: Negative for hyperdense vessel Skull: Negative Sinuses/Orbits: Negative Other: None ASPECTS (Alberta Stroke Program Early CT Score) - Ganglionic level infarction (caudate, lentiform nuclei, internal capsule, insula, M1-M3 cortex): 7 - Supraganglionic infarction (M4-M6 cortex): 3 Total score (0-10 with 10 being normal): 10 IMPRESSION: 1. No acute abnormality 2. ASPECTS is 10 Electronically Signed   By: Marlan Palau M.D.   On: 07/22/2018 13:00    Procedures Procedures (including critical care time)  CRITICAL CARE Performed by: Richardean Canal   Total critical care time: 30 minutes  Critical care time was exclusive of separately billable procedures and treating other patients.  Critical care was necessary to treat or prevent imminent or  life-threatening deterioration.  Critical care was time spent personally by me on the following activities: development of treatment plan with patient and/or surrogate as well as nursing, discussions with consultants, evaluation of patient's response to treatment, examination of patient, obtaining history from patient or surrogate, ordering and performing treatments and interventions, ordering and review of laboratory studies, ordering and review  of radiographic studies, pulse oximetry and re-evaluation of patient's condition.  INTUBATION Performed by: Richardean Canal  Required items: required blood products, implants, devices, and special equipment available Patient identity confirmed: provided demographic data and hospital-assigned identification number Time out: Immediately prior to procedure a "time out" was called to verify the correct patient, procedure, equipment, support staff and site/side marked as required.  Indications: hypoxia, lethargy   Intubation method: Glidescope Laryngoscopy   Preoxygenation: BVM  Sedatives: 20 mg Etomidate Paralytic: 100 mg rocuronium   Tube Size: 7.5 cuffed  Post-procedure assessment: chest rise and ETCO2 monitor Breath sounds: equal and absent over the epigastrium Tube secured with: ETT holder Chest x-ray interpreted by radiologist and me.  Chest x-ray findings: endotracheal tube in appropriate position  Patient tolerated the procedure well with no immediate complications.      Medications Ordered in ED Medications  etomidate (AMIDATE) injection 20 mg (20 mg Intravenous Not Given 07/22/18 1453)  rocuronium (ZEMURON) injection 100 mg (100 mg Intravenous Not Given 07/22/18 1453)  insulin regular, human (MYXREDLIN) 100 units/ 100 mL infusion (4.4 Units/hr Intravenous New Bag/Given 07/22/18 1514)  dextrose 5 %-0.45 % sodium chloride infusion (has no administration in time range)  sodium bicarbonate 150 mEq in sterile water 1,000 mL infusion (has no administration in time range)  enoxaparin (LOVENOX) injection 40 mg (has no administration in time range)  pantoprazole (PROTONIX) injection 40 mg (has no administration in time range)  acetaminophen (TYLENOL) tablet 650 mg (has no administration in time range)  ondansetron (ZOFRAN) injection 4 mg (has no administration in time range)  ipratropium-albuterol (DUONEB) 0.5-2.5 (3) MG/3ML nebulizer solution 3 mL (3 mLs Nebulization Given 07/22/18 1507)   albuterol (PROVENTIL) (2.5 MG/3ML) 0.083% nebulizer solution 2.5 mg (has no administration in time range)  hydrALAZINE (APRESOLINE) injection 10 mg (has no administration in time range)  fentaNYL (SUBLIMAZE) injection 100 mcg (has no administration in time range)  fentaNYL (SUBLIMAZE) injection 100 mcg (has no administration in time range)  midazolam (VERSED) injection 2 mg (has no administration in time range)  midazolam (VERSED) injection 2 mg (has no administration in time range)  docusate (COLACE) 50 MG/5ML liquid 100 mg (has no administration in time range)  bisacodyl (DULCOLAX) suppository 10 mg (has no administration in time range)  ceFEPIme (MAXIPIME) 2 g in sodium chloride 0.9 % 100 mL IVPB (has no administration in time range)  vancomycin (VANCOCIN) 1,750 mg in sodium chloride 0.9 % 500 mL IVPB (has no administration in time range)  levothyroxine (SYNTHROID, LEVOTHROID) tablet 175 mcg (has no administration in time range)  ceFEPIme (MAXIPIME) 2 g in sodium chloride 0.9 % 100 mL IVPB (has no administration in time range)  vancomycin (VANCOCIN) 1,750 mg in sodium chloride 0.9 % 500 mL IVPB (has no administration in time range)  0.9 %  sodium chloride infusion (has no administration in time range)  dextrose 5 %-0.45 % sodium chloride infusion (has no administration in time range)  labetalol (NORMODYNE,TRANDATE) injection 10 mg (10 mg Intravenous Given 07/22/18 1242)  insulin aspart (novoLOG) injection 10 Units (10 Units Intravenous Given 07/22/18 1313)  sodium bicarbonate injection 50 mEq (50  mEq Intravenous Given 07/22/18 1314)  sodium chloride 0.9 % bolus 1,000 mL (0 mLs Intravenous Stopped 07/22/18 1415)  calcium gluconate 1 g/ 50 mL sodium chloride IVPB (0 g Intravenous Stopped 07/22/18 1344)  etomidate (AMIDATE) injection (20 mg Intravenous Given 07/22/18 1317)  rocuronium (ZEMURON) injection (100 mg Intravenous Given 07/22/18 1318)  lactated ringers bolus 1,000 mL (1,000 mLs Intravenous New  Bag/Given 07/22/18 1502)     Initial Impression / Assessment and Plan / ED Course  I have reviewed the triage vital signs and the nursing notes.  Pertinent labs & imaging results that were available during my care of the patient were reviewed by me and considered in my medical decision making (see chart for details).       Domonik Kerstein is a 48 y.o. male here with AMS.  Patient is altered and has obvious right-sided weakness.  Patient is somnolent and not arousable.  Given that he is within the code stroke window, I did talk to Dr. Laurence Slate around 1 pm.  On arrival, his blood sugar read high and he was hypertensive 190s, I am concerned for possible head bleed. Dr. Laurence Slate states that patient just had neck surgery and does not qualify for TPA but he will come and see patient.  1:30 pm CT head showed no bleed. Cr 2 is unable to get CTA head/neck. Patient acidotic and likely in DKA. Since he is hypoxic to 85% on nonrebreather and pH is 6.9, I intubated patient for airway protection.   2 pm ICU at bedside. CXR confirmed ET tube placement. Started insulin drip for DKA. Has AKI as well. ICU to admit. Afebrile, no known COVID contacts. COVID testing deferred to ICU but given no known contacts, will hold off on testing.   Oreoluwa Molinelli was evaluated in Emergency Department on 07/22/2018 for the symptoms described in the history of present illness. He was evaluated in the context of the global COVID-19 pandemic, which necessitated consideration that the patient might be at risk for infection with the SARS-CoV-2 virus that causes COVID-19. Institutional protocols and algorithms that pertain to the evaluation of patients at risk for COVID-19 are in a state of rapid change based on information released by regulatory bodies including the CDC and federal and state organizations. These policies and algorithms were followed during the patient's care in the ED.    Final Clinical Impressions(s) / ED Diagnoses   Final  diagnoses:  Diabetic ketoacidosis with coma associated with type 2 diabetes mellitus (HCC)  AKI (acute kidney injury) [facility])    ED Discharge Orders    None       Charlynne Pander, MD 07/22/18 1521

## 2018-07-22 NOTE — Care Management (Signed)
Received call from Epimenio Sarin [phone], Worker's Comp Case Manager who was notified that patient was readmitted to the hospital. Patient currently in ED with admit to ICU order. Chip Boer is in the process of working on inpatient rehab approval as a potential disposition once patient is medically stable. He is currently intubated.  CM- Please update Chip Boer on patient status periodically.

## 2018-07-23 ENCOUNTER — Inpatient Hospital Stay (HOSPITAL_COMMUNITY): Payer: Medicaid Other

## 2018-07-23 ENCOUNTER — Other Ambulatory Visit: Payer: Self-pay

## 2018-07-23 ENCOUNTER — Other Ambulatory Visit (HOSPITAL_COMMUNITY): Payer: Self-pay

## 2018-07-23 DIAGNOSIS — J9600 Acute respiratory failure, unspecified whether with hypoxia or hypercapnia: Secondary | ICD-10-CM

## 2018-07-23 DIAGNOSIS — G9340 Encephalopathy, unspecified: Secondary | ICD-10-CM

## 2018-07-23 DIAGNOSIS — I63433 Cerebral infarction due to embolism of bilateral posterior cerebral arteries: Secondary | ICD-10-CM

## 2018-07-23 DIAGNOSIS — E1111 Type 2 diabetes mellitus with ketoacidosis with coma: Principal | ICD-10-CM

## 2018-07-23 LAB — POCT I-STAT 7, (LYTES, BLD GAS, ICA,H+H)
Acid-Base Excess: 6 mmol/L — ABNORMAL HIGH (ref 0.0–2.0)
Acid-Base Excess: 8 mmol/L — ABNORMAL HIGH (ref 0.0–2.0)
Bicarbonate: 27.2 mmol/L (ref 20.0–28.0)
Bicarbonate: 31.1 mmol/L — ABNORMAL HIGH (ref 20.0–28.0)
Calcium, Ion: 1.16 mmol/L (ref 1.15–1.40)
Calcium, Ion: 1.12 mmol/L — ABNORMAL LOW (ref 1.15–1.40)
HCT: 26 % — ABNORMAL LOW (ref 39.0–52.0)
HCT: 43 % (ref 39.0–52.0)
Hemoglobin: 8.8 g/dL — ABNORMAL LOW (ref 13.0–17.0)
Hemoglobin: 14.6 g/dL (ref 13.0–17.0)
O2 Saturation: 99 %
O2 Saturation: 100 %
Patient temperature: 37.3
Potassium: 3.3 mmol/L — ABNORMAL LOW (ref 3.5–5.1)
Potassium: 3.7 mmol/L (ref 3.5–5.1)
Sodium: 144 mmol/L (ref 135–145)
Sodium: 143 mmol/L (ref 135–145)
TCO2: 28 mmol/L (ref 22–32)
TCO2: 32 mmol/L (ref 22–32)
pCO2 arterial: 27.1 mmHg — ABNORMAL LOW (ref 32.0–48.0)
pCO2 arterial: 38.6 mmHg (ref 32.0–48.0)
pH, Arterial: 7.611 (ref 7.350–7.450)
pH, Arterial: 7.515 — ABNORMAL HIGH (ref 7.350–7.450)
pO2, Arterial: 105 mmHg (ref 83.0–108.0)
pO2, Arterial: 380 mmHg — ABNORMAL HIGH (ref 83.0–108.0)

## 2018-07-23 LAB — BLOOD CULTURE ID PANEL (REFLEXED)

## 2018-07-23 LAB — GLUCOSE, CAPILLARY
Glucose-Capillary: 289 mg/dL — ABNORMAL HIGH (ref 70–99)
Glucose-Capillary: 269 mg/dL — ABNORMAL HIGH (ref 70–99)
Glucose-Capillary: 296 mg/dL — ABNORMAL HIGH (ref 70–99)
Glucose-Capillary: 381 mg/dL — ABNORMAL HIGH (ref 70–99)
Glucose-Capillary: 436 mg/dL — ABNORMAL HIGH (ref 70–99)
Glucose-Capillary: 489 mg/dL — ABNORMAL HIGH (ref 70–99)
Glucose-Capillary: >600 mg/dL (ref 70–99)
Glucose-Capillary: >600 mg/dL (ref 70–99)
Glucose-Capillary: >600 mg/dL (ref 70–99)
Glucose-Capillary: 216 mg/dL — ABNORMAL HIGH (ref 70–99)
Glucose-Capillary: 109 mg/dL — ABNORMAL HIGH (ref 70–99)
Glucose-Capillary: 111 mg/dL — ABNORMAL HIGH (ref 70–99)
Glucose-Capillary: 121 mg/dL — ABNORMAL HIGH (ref 70–99)
Glucose-Capillary: 147 mg/dL — ABNORMAL HIGH (ref 70–99)
Glucose-Capillary: 165 mg/dL — ABNORMAL HIGH (ref 70–99)
Glucose-Capillary: 212 mg/dL — ABNORMAL HIGH (ref 70–99)
Glucose-Capillary: 84 mg/dL (ref 70–99)
Glucose-Capillary: 189 mg/dL — ABNORMAL HIGH (ref 70–99)

## 2018-07-23 LAB — BASIC METABOLIC PANEL
Anion gap: 13 (ref 5–15)
Anion gap: 13 (ref 5–15)
Anion gap: 14 (ref 5–15)
BUN: 47 mg/dL — ABNORMAL HIGH (ref 6–20)
BUN: 53 mg/dL — ABNORMAL HIGH (ref 6–20)
BUN: 59 mg/dL — ABNORMAL HIGH (ref 6–20)
CO2: 20 mmol/L — ABNORMAL LOW (ref 22–32)
CO2: 24 mmol/L (ref 22–32)
CO2: 26 mmol/L (ref 22–32)
Calcium: 8.5 mg/dL — ABNORMAL LOW (ref 8.9–10.3)
Calcium: 8.9 mg/dL (ref 8.9–10.3)
Calcium: 9 mg/dL (ref 8.9–10.3)
Chloride: 101 mmol/L (ref 98–111)
Chloride: 103 mmol/L (ref 98–111)
Chloride: 104 mmol/L (ref 98–111)
Creatinine, Ser: 1.74 mg/dL — ABNORMAL HIGH (ref 0.61–1.24)
Creatinine, Ser: 1.95 mg/dL — ABNORMAL HIGH (ref 0.61–1.24)
Creatinine, Ser: 2.3 mg/dL — ABNORMAL HIGH (ref 0.61–1.24)
GFR calc Af Amer: 37 mL/min — ABNORMAL LOW (ref >60)
GFR calc Af Amer: 45 mL/min — ABNORMAL LOW (ref >60)
GFR calc Af Amer: 52 mL/min — ABNORMAL LOW (ref >60)
GFR calc non Af Amer: 32 mL/min — ABNORMAL LOW (ref >60)
GFR calc non Af Amer: 39 mL/min — ABNORMAL LOW (ref >60)
GFR calc non Af Amer: 45 mL/min — ABNORMAL LOW (ref >60)
Glucose, Bld: 101 mg/dL — ABNORMAL HIGH (ref 70–99)
Glucose, Bld: 224 mg/dL — ABNORMAL HIGH (ref 70–99)
Glucose, Bld: 478 mg/dL — ABNORMAL HIGH (ref 70–99)
Potassium: 3.5 mmol/L (ref 3.5–5.1)
Potassium: 3.5 mmol/L (ref 3.5–5.1)
Potassium: 3.7 mmol/L (ref 3.5–5.1)
Sodium: 135 mmol/L (ref 135–145)
Sodium: 140 mmol/L (ref 135–145)
Sodium: 143 mmol/L (ref 135–145)

## 2018-07-23 LAB — URINE CULTURE: Culture: NO GROWTH

## 2018-07-23 LAB — HEPATITIS PANEL, ACUTE
HCV Ab: >11 s/co ratio — ABNORMAL HIGH (ref 0.0–0.9)
Hep A IgM: NEGATIVE
Hep B C IgM: NEGATIVE
Hepatitis B Surface Ag: NEGATIVE

## 2018-07-23 LAB — CBC
HCT: 25.6 % — ABNORMAL LOW (ref 39.0–52.0)
Hemoglobin: 9.1 g/dL — ABNORMAL LOW (ref 13.0–17.0)
MCH: 30.7 pg (ref 26.0–34.0)
MCHC: 35.5 g/dL (ref 30.0–36.0)
MCV: 86.5 fL (ref 80.0–100.0)
Platelets: 348 10*3/uL (ref 150–400)
RBC: 2.96 MIL/uL — ABNORMAL LOW (ref 4.22–5.81)
RDW: 13.1 % (ref 11.5–15.5)
WBC: 18.2 10*3/uL — ABNORMAL HIGH (ref 4.0–10.5)
nRBC: 0 % (ref 0.0–0.2)

## 2018-07-23 LAB — TRIGLYCERIDES: Triglycerides: 99 mg/dL (ref <150)

## 2018-07-23 LAB — HIV ANTIBODY (ROUTINE TESTING W REFLEX): HIV Screen 4th Generation wRfx: NONREACTIVE

## 2018-07-23 LAB — MAGNESIUM: Magnesium: 2.4 mg/dL (ref 1.7–2.4)

## 2018-07-23 LAB — PHOSPHORUS
Phosphorus: <1 mg/dL — CL (ref 2.5–4.6)
Phosphorus: <1 mg/dL — CL (ref 2.5–4.6)

## 2018-07-23 LAB — MRSA PCR SCREENING: MRSA by PCR: NEGATIVE

## 2018-07-23 MED ORDER — POTASSIUM & SODIUM PHOSPHATES 280-160-250 MG PO PACK
2.0000 | PACK | Freq: Three times a day (TID) | ORAL | Status: AC
  Administered 2018-07-23 (×3): 2 via ORAL
  Filled 2018-07-23 (×3): qty 2

## 2018-07-23 MED ORDER — GADOBUTROL 1 MMOL/ML IV SOLN
8.0000 mL | Freq: Once | INTRAVENOUS | Status: AC | PRN
  Administered 2018-07-23: 8 mL via INTRAVENOUS

## 2018-07-23 MED ORDER — SODIUM CHLORIDE 0.45 % IV SOLN
INTRAVENOUS | Status: DC
  Administered 2018-07-23 – 2018-07-24 (×2): via INTRAVENOUS

## 2018-07-23 MED ORDER — INSULIN ASPART 100 UNIT/ML ~~LOC~~ SOLN
3.0000 [IU] | SUBCUTANEOUS | Status: DC
  Administered 2018-07-23: 6 [IU] via SUBCUTANEOUS
  Administered 2018-07-23: 9 [IU] via SUBCUTANEOUS
  Administered 2018-07-24 (×2): 3 [IU] via SUBCUTANEOUS
  Administered 2018-07-24 – 2018-07-25 (×3): 6 [IU] via SUBCUTANEOUS
  Administered 2018-07-25 (×2): 9 [IU] via SUBCUTANEOUS
  Administered 2018-07-25 – 2018-07-26 (×2): 6 [IU] via SUBCUTANEOUS
  Administered 2018-07-26: 3 [IU] via SUBCUTANEOUS
  Administered 2018-07-26: 6 [IU] via SUBCUTANEOUS
  Administered 2018-07-27: 9 [IU] via SUBCUTANEOUS
  Administered 2018-07-27: 04:00:00 6 [IU] via SUBCUTANEOUS
  Administered 2018-07-27 – 2018-07-28 (×2): 9 [IU] via SUBCUTANEOUS
  Administered 2018-07-28: 6 [IU] via SUBCUTANEOUS
  Administered 2018-07-28: 9 [IU] via SUBCUTANEOUS
  Administered 2018-07-28 (×2): 6 [IU] via SUBCUTANEOUS
  Administered 2018-07-29: 3 [IU] via SUBCUTANEOUS

## 2018-07-23 MED ORDER — AMLODIPINE BESYLATE 5 MG PO TABS
5.0000 mg | ORAL_TABLET | Freq: Every day | ORAL | Status: DC
  Administered 2018-07-23 – 2018-07-25 (×3): 5 mg via ORAL
  Filled 2018-07-23 (×3): qty 1

## 2018-07-23 MED ORDER — POTASSIUM CHLORIDE 20 MEQ/15ML (10%) PO SOLN
40.0000 meq | Freq: Once | ORAL | Status: AC
  Administered 2018-07-23: 40 meq
  Filled 2018-07-23: qty 30

## 2018-07-23 MED ORDER — POTASSIUM PHOSPHATES 15 MMOLE/5ML IV SOLN
30.0000 mmol | Freq: Once | INTRAVENOUS | Status: AC
  Administered 2018-07-23: 30 mmol via INTRAVENOUS
  Filled 2018-07-23: qty 10

## 2018-07-23 MED ORDER — INSULIN DETEMIR 100 UNIT/ML ~~LOC~~ SOLN
18.0000 [IU] | Freq: Two times a day (BID) | SUBCUTANEOUS | Status: DC
  Administered 2018-07-23 – 2018-07-28 (×10): 18 [IU] via SUBCUTANEOUS
  Filled 2018-07-23 (×15): qty 0.18

## 2018-07-23 MED ORDER — PROPOFOL 1000 MG/100ML IV EMUL
0.0000 ug/kg/min | INTRAVENOUS | Status: DC
  Administered 2018-07-23: 15 ug/kg/min via INTRAVENOUS
  Administered 2018-07-23: 5 ug/kg/min via INTRAVENOUS
  Administered 2018-07-24: 20 ug/kg/min via INTRAVENOUS
  Administered 2018-07-24: 25 ug/kg/min via INTRAVENOUS
  Administered 2018-07-25: 20 ug/kg/min via INTRAVENOUS
  Filled 2018-07-23 (×7): qty 100

## 2018-07-23 NOTE — Progress Notes (Signed)
PHARMACY - PHYSICIAN COMMUNICATION CRITICAL VALUE ALERT - BLOOD CULTURE IDENTIFICATION (BCID)  Corey Li is an 48 y.o. male who presented to [facility] on 07/22/2018 with a chief complaint of AMS, found to be in DKA. Also receiving empiric treatment for possible PNA. Admission BCx now 1/4 GPC, BCID CoNS.  Name of physician (or Provider) ContactedVassie Loll  Current antibiotics: Vancomycin and cefepime  Changes to prescribed antibiotics recommended:  No changes - likely contaminant  Results for orders placed or performed during the hospital encounter of 07/22/18  Blood Culture ID Panel (Reflexed) (Collected: 07/22/2018  2:29 PM)  Result Value Ref Range   Enterococcus species NOT DETECTED NOT DETECTED   Listeria monocytogenes NOT DETECTED NOT DETECTED   Staphylococcus species DETECTED (A) NOT DETECTED   Staphylococcus aureus (BCID) NOT DETECTED NOT DETECTED   Methicillin resistance DETECTED (A) NOT DETECTED   Streptococcus species NOT DETECTED NOT DETECTED   Streptococcus agalactiae NOT DETECTED NOT DETECTED   Streptococcus pneumoniae NOT DETECTED NOT DETECTED   Streptococcus pyogenes NOT DETECTED NOT DETECTED   Acinetobacter baumannii NOT DETECTED NOT DETECTED   Enterobacteriaceae species NOT DETECTED NOT DETECTED   Enterobacter cloacae complex NOT DETECTED NOT DETECTED   Escherichia coli NOT DETECTED NOT DETECTED   Klebsiella oxytoca NOT DETECTED NOT DETECTED   Klebsiella pneumoniae NOT DETECTED NOT DETECTED   Proteus species NOT DETECTED NOT DETECTED   Serratia marcescens NOT DETECTED NOT DETECTED   Haemophilus influenzae NOT DETECTED NOT DETECTED   Neisseria meningitidis NOT DETECTED NOT DETECTED   Pseudomonas aeruginosa NOT DETECTED NOT DETECTED   Candida albicans NOT DETECTED NOT DETECTED   Candida glabrata NOT DETECTED NOT DETECTED   Candida krusei NOT DETECTED NOT DETECTED   Candida parapsilosis NOT DETECTED NOT DETECTED   Candida tropicalis NOT DETECTED NOT DETECTED      N. Zigmund Daniel, PharmD, BCPS PGY2 Infectious Diseases Pharmacy Resident Phone: [phone] 07/23/2018  11:52 AM

## 2018-07-23 NOTE — Progress Notes (Signed)
RT NOTE: RT transported patient from 3M04 to MRI and back with RN without complications. VS stable. RT will continue to monitor.

## 2018-07-23 NOTE — Progress Notes (Signed)
Pt unavailable for EEG. STAT MRI ordered first. Will attempt at a later time when schedule permits

## 2018-07-23 NOTE — Progress Notes (Signed)
RT obtained ABG and critical results reported to Posey Boyer, NP  Results are as follows: pH 7.61, CO2 27.1, O2 105, HCO3 27.2 Change RR to 17 per NP. RT will continue to monitor.

## 2018-07-23 NOTE — CV Procedure (Signed)
Echo attempted patient in MRI. Will attempt again 07/24/18.

## 2018-07-23 NOTE — Progress Notes (Addendum)
See other note

## 2018-07-23 NOTE — Progress Notes (Addendum)
NEUROLOGY PROGRESS NOTE  Subjective: Patient currently intubated and nonverbal but does withdraw briskly from pain on the left but not the right  Exam:     Vitals:   07/23/18 0809 07/23/18 0811  BP:    Pulse:    Resp:    Temp:    SpO2: 100% 100%    Physical Exam   HEENT-  Normocephalic, no lesions, without obvious abnormality.  Normal external eye and conjunctiva.   Extremities- Warm, dry and intact Musculoskeletal-no joint tenderness, deformity or swelling Skin-warm and dry, no hyperpigmentation, vitiligo, or suspicious lesions    Neuro: Mental Status: Patient is intubated, not breathing over the vent Cranial Nerves: II: No blink to threat III,IV, VI: ptosis not present, disconjugate gaze, bilaterally pupils equal, round, reactive to light and accommodation, did not attempt significant doll's maneuver secondary to recent surgery V,VII: Face symmetric, facial light touch sensation normal bilaterally VIII: No response to vocal stimuli  Motor: Withdraws briskly from pain on the left with minimal to no response on the right, no increased tone  Sensory: Withdraws briskly on the left but not right to noxious stimuli Deep Tendon Reflexes: 2+ and symmetric in bilateral upper extremities and knee jerk, no ankle jerk Plantars: Upgoing bilaterally      Medications:  Prior to Admission:         Medications Prior to Admission  Medication Sig Dispense Refill Last Dose  . amLODipine (NORVASC) 5 MG tablet Take 5 mg by mouth daily.   UNK  . atorvastatin (LIPITOR) 80 MG tablet Take 80 mg by mouth daily.    UNK  . diazepam (VALIUM) 5 MG tablet Take 1 tablet (5 mg total) by mouth every 6 (six) hours as needed for muscle spasms. 40 tablet 0 UNK  . ferrous sulfate 325 (65 FE) MG tablet Take 325 mg by mouth daily with breakfast.   UNK  . FLUoxetine (PROZAC) 10 MG capsule Take 10 mg by mouth daily.    UNK  . furosemide (LASIX) 40 MG tablet Take 40 mg by  mouth 2 (two) times daily.   UNK  . gabapentin (NEURONTIN) 300 MG capsule Take 300 mg by mouth daily as needed (pain).    UNK  . insulin NPH Human (HUMULIN N,NOVOLIN N) 100 UNIT/ML injection Inject 36-47 Units into the skin See admin instructions. 47 units in the morning, and 36 units in the evening   UNK  . insulin regular (NOVOLIN R,HUMULIN R) 100 units/mL injection Inject 17-27 Units into the skin See admin instructions. 27 units in the morning and 17 units in the evening   UNK  . levothyroxine (SYNTHROID, LEVOTHROID) 175 MCG tablet Take 175 mcg by mouth daily before breakfast.   UNK  . lisinopril (PRINIVIL,ZESTRIL) 40 MG tablet Take 40 mg by mouth daily.   UNK  . metFORMIN (GLUCOPHAGE) 1000 MG tablet Take 1,000 mg by mouth 2 (two) times daily with a meal.   UNK  . mupirocin ointment (BACTROBAN) 2 % Place 1 application into the nose 2 (two) times daily.   UNK  . omeprazole (PRILOSEC) 20 MG capsule Take 20 mg by mouth daily.    UNK  . oxyCODONE-acetaminophen (PERCOCET/ROXICET) 5-325 MG tablet Take 1-2 tablets by mouth every 4 (four) hours as needed for moderate pain or severe pain. 40 tablet 0 UNK  . polyethylene glycol (MIRALAX / GLYCOLAX) 17 g packet Take 17 g by mouth daily as needed for mild constipation.   UNK    Pertinent Labs/Diagnostics: Glucose improving  now down to 216 Phosphorus less than 1 CBC 18.2 Creatinine decreased to 1.95 Recent HbA1c on 07/13/2018 was 10.9  Ct Angio Head W Or Wo Contrast  Result Date: 07/22/2018 CLINICAL DATA:  Altered mental status. Right-sided weakness. IMPRESSION: 1. No emergent large vessel occlusion. 2. Widely patent cervical carotid and vertebral arteries. 3. Diffuse irregular narrowing of the small to medium-sized intracranial arteries. While this could be partly artifactual, a diffuse non-atherosclerotic process such as vasculitis is a concern. 4. Patchy ground-glass opacities in both lung apices, nonspecific. Atypical infection  (including viral pneumonia) and noninfectious etiologies are possible. 5. Postoperative changes from recent cervical spine fusion as previously described. Postoperative infection is not excluded. These results were called by telephone at the time of interpretation on 07/22/2018 at 4:59 pm to Dr. Craige Cotta, who verbally acknowledged these results. Electronically Signed   By: Sebastian Ache M.D.   On: 07/22/2018 17:01   MRI of brain- multifocal acute ischemia throughout multiple vascular territories in a pattern most consistent with emboli from a central cardiac or aortic source.  The largest area of ischemia are within the posterior cerebellar artery territories.  No acute hemorrhage, herniation or hydrocephalus.  MRA brain- bilateral moderate to severe distal P2 segment PCA stenosis with no proximal intracranial artery occlusion  Felicie Morn PA-C [facility] [phone]     07/23/2018, 8:51 AM   NEUROHOSPITALIST ADDENDUM Performed a face to face diagnostic evaluation.   I have reviewed the contents of history and physical exam as documented by PA/ARNP/Resident and agree with above documentation.  I have discussed and formulted the above plan as documented. Edits to the note have been made as needed.  Assessment: Presentation was to the ED with altered mental status and respiratory failure with compromised airway, acute metabolic encephalopathy with right-sided weakness.  Recent cervical decompression and fusion.  Noted to be in diabetic ketoacidosis and obtunded on assessment.  Patient was intubated.  CT angiogram was performed which showed diminutive vessels, however no large vessel occlusion no basilar artery thrombus to explain patient's presentation.  Patient did not receive TPA as he was not within the window and he was not an IR candidate as there is no LVO. Patient was examined today, intubated however not moving his right upper and lower extremities compared to the left which  is moving spontaneously.  Recommended obtaining MRI brain.  MRI brain shows multiple infarcts-most notably bilateral PCA territories and left internal capsule (possible anterior choroidal artery) which explains patient's right hemiplegia. MRA shows diminutive bilateral PCAs.  I think differential for etiology of stroke remains broad -Suspect cardioembolic versus vasculitis(possibly from sepsis)   Recommendations: #PCCM to continue metabolic disturbances, acidosis #Transthoracic Echo, may need  loop monitor  #Start or continue Atorvastatin 80 mg #Blood cultures to rule out bacteremia, hold aspirin until blood cultures negative # BP goal: permissive HTN upto 180/110 mmHg # Lipid profile # Telemetry monitoring # Frequent neuro checks # NPO until passes stroke swallow screen # please page stroke NP  Or  PA  Or MD from 8am -4 pm  as this patient from this time will be  followed by the stroke.   You can look them up on [email or web address]  Password [email or web address]    This patient is neurologically critically ill due to multiple strokes and impaired cognition requiring intubation.  He is at risk for significant risk of neurological worsening from cerebral edema, hemorrhagic conversion, infection, respiratory failure and seizure. This patient's care requires constant monitoring of vital signs, hemodynamics,  respiratory and cardiac monitoring, review of multiple databases, neurological assessment, discussion with family, other specialists and medical decision making of high complexity.  I spent 35 minutes of neurocritical time in the care of this patient.      Georgiana SpinnerSushanth  MD [facility] [phone]   If 7pm to 7am, please call on call as listed on AMION. a

## 2018-07-23 NOTE — Progress Notes (Signed)
RT Note:  RN called me to patient room.  Stated patient had desated into the 54s.  When I got to patient room, sat had came up to 90%, RN stated she had suctioned patient but secretions were extremely thick.  RT lavaged patient after giving O2 breath, and re-suctioned patient.  Got back tan secretions and mucus plugs.  Patient sats went back up to 100%.  Patient BS were extremely diminished, will continue to monitor patient.

## 2018-07-23 NOTE — Progress Notes (Signed)
NAMEGari Li, MRN:  [id number], DOB:  1971/02/23, LOS: 1 ADMISSION DATE:  07/22/2018, CONSULTATION DATE:  07/22/2018 REFERRING MD:  Dr. Silverio Lay, CHIEF COMPLAINT:  AMS  Brief History   66 yoM DMT2, s/p recent A/P-CDF C5- C7 by Ortho on 4/1 and 4/2 presenting from hotel room with AMS and hypoxic.  Some right sided weakness in ER, code stroke called, CTH neg.  CT cervical with stable postop changes.  Afebrile. Glucose 958.  Intubated for airway protection, hypotensive post intubation.  PCCM to admit for DKA.   History of present illness   Tobacco abuse, DM type 2, HTN, HLD, hypothyroid, sleep apnea, GERD, sleep, anxiety, depression  Significant Hospital Events   4/1/ 4/2 A/P cervical decompression and fusion by Dr. Yevette Edwards  Consults:  Neurology   Procedures:  4/8 ETT >>  Significant Diagnostic Tests:  4/8 [facility] code stroke >> neg  4/8 CT cervical  >>Image quality degraded by moderate motion.  Corpectomy at C6. Anterior and posterior fusion from C5 through C7.  Hardware in good position and unchanged from recent study.  Postop prevertebral soft tissue swelling related to recent surgery and unchanged from the prior study.  4/8 CXR >>Tube positions as described without pneumothorax. Note that nasogastric tube tip is at the gastroesophageal junction. Advise advancing nasogastric tube 8-10 cm. Areas of atelectatic change in the right mid lung and bilateral base regions. No consolidation. Heart size within normal limits.  4/8 CTA head/ neck >>  1. No emergent large vessel occlusion.  2. Widely patent cervical carotid and vertebral arteries. 3. Diffuse irregular narrowing of the small to medium-sized intracranial arteries. While this could be partly artifactual, a diffuse non-atherosclerotic process such as vasculitis is a concern. 4. Patchy ground-glass opacities in both lung apices, nonspecific. Atypical infection (including viral pneumonia) and noninfectious etiologies are possible. 5.  Postoperative changes from recent cervical spine fusion as previously described. Postoperative infection is not excluded.  Micro Data:  4/8 BC x 2 >> 4/8 UC >> 4/8 RVP >> negative 4/9 trach asp >> 4/9 MRSA PCR >>  Antimicrobials:  4/8 vanc >> 4/8 cefepime >>  Interim history/subjective:  tmax 99 WBC 18 Waking up, not f/c, not moving right side, moving left side spont  Objective   Blood pressure 137/75, pulse 74, temperature 99 F (37.2 C), temperature source Axillary, resp. rate (!) 21, height 5\' 9"  (1.753 m), weight 75.7 kg, SpO2 100 %.    Vent Mode: PRVC FiO2 (%):  [40 %-100 %] 40 % Set Rate:  [26 bmp-35 bmp] 26 bmp Vt Set:  [560 mL] 560 mL PEEP:  [5 cmH20] 5 cmH20 Plateau Pressure:  [15 cmH20-23 cmH20] 15 cmH20   Intake/Output Summary (Last 24 hours) at 07/23/2018 0905 Last data filed at 07/23/2018 0700 Gross per 24 hour  Intake 4417.98 ml  Output 2500 ml  Net 1917.98 ml   Filed Weights   07/22/18 1424 07/23/18 0500  Weight: 85 kg 75.7 kg   Examination: General:  Critically ill adult male on MV in NAD HEENT: MM pink/moist, ETT/ OGT reinserted, left gaze, pupils 4/reactive, anicteric  Neuro: not opening eyes or f/c, localizes with left side, no movement noted in right CV: rr, no murmur PULM: even/non-labored, lungs bilaterally coarse GI: soft, non-tender, bs active  Extremities: warm/dry, no edema, right ankle bracelet Skin: no rashes  Resolved Hospital Problem list    Assessment & Plan:   Acute hypoxic respiratory failure Tobacco abuse  - ? Stridor w/EMS - neg  RVP - some GGO noted on CTA head/ neck not visualized on CXR  - CXR 4/9 stable ETT, OGT in esophagus/ not in correct place, stable bibasilar atelectasis  P:  Full MV support, PRVC 8 cc/kg, rate 26 Repeat ABG now Trend CXR  Wean FiO2/ PEEP for sat goal >94%- minimal O2/ peep settings  VAP bundle Hold SBT for now, pending MRI Will need to ensure cuff leak prior to extubation  Acute  encephalopathy most likely metabolic  R/o Stroke - presenting with AMS and right sided weakness (no weakness reported right sided weakness on discharge 4/4 ) P:  Neurology following, appreciate assistance.   CTH/ CTA neg, some concern for possible vasculitis Stat MRI brain ordered, if negative, will need EEG Ongoing neuro exams  Assess UDS (positive for benzo/ on home valium) , ETOH neg on admit  DKA Hx DMT2 P:  AG closed Transition off insulin gtt / DKA protocol when able beta-hydroxybutyric acid >8  Hypotension- resolved  P:  Tele monitoring Goal MAP >65   AKI AGMA- resolved Hyperkalemia- pseudo due to acidosis/ resolved P:  Stop bicarb Recheck phos Trend BMP/ mag / phos/ UOP   Leukocytosis  - tmax 99 - PCT 0.77 P:  Pan cultured- follow  UA neg, 20 ketones continue vanc/ cefepime, narrow as able  Trend PCT  Trend WBC/ fever curve   S/p A/ P CDF C5-C7, on 4/1/ 4/2 - CT cervical shows no fluid collections, stable postop prevertebral soft tissue swelling P:  Dr. Yevette Edwards following, would likely need rehab/ SNF on discharge  Hypothyroidism P:  Daily synthroid 175 mcg  Hx HTN, HLD Abnormal EKG P:  Repeat EKG normal and trop neg Restart home norvasc  Hold home lisinopril and lasix  continue home lipitor  Anxiety/ depression P:  Hold home prozac, valium  GERD P:  PPI  Best practice:  Diet: NPO, start TF if not extubated this afternoon Pain/Anxiety/Delirium protocol (if indicated): prn fentanyl/ versed if needed  VAP protocol (if indicated): yes DVT prophylaxis: lovenox GI prophylaxis: PPI Glucose control: transition off gtt when able Mobility: BR Code Status: Full  Family Communication: no family.   Disposition: ICU  Labs   CBC: Recent Labs  Lab 07/22/18 1234 07/22/18 1246 07/22/18 1621 07/23/18 0448  WBC 18.2*  --   --  18.2*  NEUTROABS 15.3*  --   --   --   HGB 10.7* 12.2* 13.9 9.1*  HCT 35.8* 36.0* 41.0 25.6*  MCV 104.1*  --   --   86.5  PLT 482*  --   --  348    Basic Metabolic Panel: Recent Labs  Lab 07/22/18 1234 07/22/18 1246 07/22/18 1621 07/22/18 1627 07/22/18 1956 07/22/18 2344 07/23/18 0448  NA 127* 123* 127*  --  133* 135 140  K 6.1* 5.9* 4.5  --  4.9 3.5 3.7  CL 84*  --   --   --  94* 101 103  CO2 <7*  --   --   --  14* 20* 24  GLUCOSE 958*  --   --   --  691* 478* 224*  BUN 60*  --   --   --  62* 59* 53*  CREATININE 3.00* 2.00*  --   --  2.67* 2.30* 1.95*  CALCIUM 9.8  --   --   --  8.9 8.5* 9.0  MG  --   --   --  2.6*  --   --  2.4  PHOS  --   --   --  5.8*  --   --  <1.0*   GFR: Estimated Creatinine Clearance: 45.8 mL/min (A) (by C-G formula based on SCr of 1.95 mg/dL (H)). Recent Labs  Lab 07/22/18 1234 07/22/18 1331 07/22/18 1627 07/23/18 0448  PROCALCITON  --   --  0.77  --   WBC 18.2*  --   --  18.2*  LATICACIDVEN  --  2.9* 2.2*  --     Liver Function Tests: Recent Labs  Lab 07/22/18 1234  AST 40  ALT 35  ALKPHOS 121  BILITOT 2.1*  PROT 8.1  ALBUMIN 4.1   Recent Labs  Lab 07/22/18 1627  LIPASE 73*  AMYLASE 172*   No results for input(s): AMMONIA in the last 168 hours.  ABG    Component Value Date/Time   PHART 7.178 (LL) 07/22/2018 1621   PCO2ART 26.3 (L) 07/22/2018 1621   PO2ART 361.0 (H) 07/22/2018 1621   HCO3 9.8 (L) 07/22/2018 1621   TCO2 11 (L) 07/22/2018 1621   ACIDBASEDEF 17.0 (H) 07/22/2018 1621   O2SAT 100.0 07/22/2018 1621     Coagulation Profile: Recent Labs  Lab 07/22/18 1234  INR 1.2    Cardiac Enzymes: Recent Labs  Lab 07/22/18 1627  TROPONINI <0.03    HbA1C: Hgb A1c MFr Bld  Date/Time Value Ref Range Status  07/13/2018 11:18 AM 10.9 (H) 4.8 - 5.6 % Final    Comment:    (NOTE) Pre diabetes:          5.7%-6.4% Diabetes:              >6.4% Glycemic control for   <7.0% adults with diabetes     CBG: Recent Labs  Lab 07/23/18 0132 07/23/18 0236 07/23/18 0331 07/23/18 0436 07/23/18 0559  GLUCAP 381* 296* 289* 269*  216*     Critical care time: 45 mins    Posey BoyerBrooke Taela Charbonneau, MSN, AGACNP-BC [facility] Pgr: [phone] or if no answer [phone] 07/23/2018, 9:05 AM

## 2018-07-23 NOTE — Progress Notes (Signed)
Initial Nutrition Assessment  DOCUMENTATION CODES:   Not applicable  INTERVENTION:   Noted plan to begin TF later today if not extubated Tube Feeding Recommendations:  Glucerna 1.5 at 50 ml/hr Pro-Stat 30 mL daily Provides 114 g of protein, 1900 kcals and 911 mL of free water Meets 100% of protein, 104% calories   NUTRITION DIAGNOSIS:   Inadequate oral intake related to acute illness as evidenced by NPO status.  Being addressed via TF   GOAL:   Patient will meet greater than or equal to 90% of their needs  Progressing  MONITOR:   Vent status, Labs, Weight trends, TF tolerance  REASON FOR ASSESSMENT:   Ventilator    ASSESSMENT:   48 yo male admitted with DKA, acute encephalopathy with right sided weakness, acute respiratory failure requiring intubation. PMH includes DM, HTN, HLD, GERD   Patient is currently intubated on ventilator support, not on pressors MV: 8.7 L/min Temp (24hrs), Avg:98.9 F (37.2 C), Min:98.7 F (37.1 C), Max:99 F (37.2 C)  Current wt 75.7 kg, admission weight appears to be pulled from previous wt encounters.   Unable to obtain diet and weight hx  Labs: phosphorus <1.0, Creatinine 1.74, BUN 47 Meds: 1/2 NS at 75, potassium phosphate  NUTRITION - FOCUSED PHYSICAL EXAM:  Unable to perform  Diet Order:   Diet Order            Diet NPO time specified  Diet effective now              EDUCATION NEEDS:   Not appropriate for education at this time  Skin:  Skin Assessment: Reviewed RN Assessment  Last BM:  no documented BM  Height:   Ht Readings from Last 1 Encounters:  07/22/18 5\' 9"  (1.753 m)    Weight:   Wt Readings from Last 1 Encounters:  07/23/18 75.7 kg    Ideal Body Weight:  72.7 kg  BMI:  Body mass index is 24.65 kg/m.  Estimated Nutritional Needs:   Kcal:  1821 kcals   Protein:  100-125 g   Fluid:  >/= 1.8 L   Romelle Starcher MS, RD, LDN, CNSC [phone] Pager  [phone] Weekend/On-Call  Pager

## 2018-07-24 ENCOUNTER — Inpatient Hospital Stay (HOSPITAL_COMMUNITY): Payer: Medicaid Other

## 2018-07-24 ENCOUNTER — Other Ambulatory Visit (HOSPITAL_COMMUNITY): Payer: Self-pay

## 2018-07-24 ENCOUNTER — Encounter (HOSPITAL_COMMUNITY): Payer: Self-pay | Admitting: Orthopedic Surgery

## 2018-07-24 DIAGNOSIS — Z01818 Encounter for other preprocedural examination: Secondary | ICD-10-CM

## 2018-07-24 DIAGNOSIS — I6340 Cerebral infarction due to embolism of unspecified cerebral artery: Secondary | ICD-10-CM

## 2018-07-24 DIAGNOSIS — I639 Cerebral infarction, unspecified: Secondary | ICD-10-CM

## 2018-07-24 DIAGNOSIS — J9601 Acute respiratory failure with hypoxia: Secondary | ICD-10-CM

## 2018-07-24 LAB — RENAL FUNCTION PANEL
Albumin: 3.1 g/dL — ABNORMAL LOW (ref 3.5–5.0)
Anion gap: 12 (ref 5–15)
BUN: 24 mg/dL — ABNORMAL HIGH (ref 6–20)
CO2: 25 mmol/L (ref 22–32)
Calcium: 8.4 mg/dL — ABNORMAL LOW (ref 8.9–10.3)
Chloride: 103 mmol/L (ref 98–111)
Creatinine, Ser: 1.15 mg/dL (ref 0.61–1.24)
GFR calc Af Amer: >60 mL/min (ref >60)
GFR calc non Af Amer: >60 mL/min (ref >60)
Glucose, Bld: 166 mg/dL — ABNORMAL HIGH (ref 70–99)
Phosphorus: 3.2 mg/dL (ref 2.5–4.6)
Potassium: 3.6 mmol/L (ref 3.5–5.1)
Sodium: 140 mmol/L (ref 135–145)

## 2018-07-24 LAB — GLUCOSE, CAPILLARY
Glucose-Capillary: 114 mg/dL — ABNORMAL HIGH (ref 70–99)
Glucose-Capillary: 133 mg/dL — ABNORMAL HIGH (ref 70–99)
Glucose-Capillary: 134 mg/dL — ABNORMAL HIGH (ref 70–99)
Glucose-Capillary: 160 mg/dL — ABNORMAL HIGH (ref 70–99)
Glucose-Capillary: 197 mg/dL — ABNORMAL HIGH (ref 70–99)
Glucose-Capillary: 99 mg/dL (ref 70–99)

## 2018-07-24 LAB — LIPID PANEL
Cholesterol: 148 mg/dL (ref 0–200)
HDL: 37 mg/dL — ABNORMAL LOW (ref >40)
LDL Cholesterol: 72 mg/dL (ref 0–99)
Total CHOL/HDL Ratio: 4 RATIO
Triglycerides: 195 mg/dL — ABNORMAL HIGH (ref <150)
VLDL: 39 mg/dL (ref 0–40)

## 2018-07-24 LAB — CBC
HCT: 25.7 % — ABNORMAL LOW (ref 39.0–52.0)
Hemoglobin: 9.3 g/dL — ABNORMAL LOW (ref 13.0–17.0)
MCH: 32.3 pg (ref 26.0–34.0)
MCHC: 36.2 g/dL — ABNORMAL HIGH (ref 30.0–36.0)
MCV: 89.2 fL (ref 80.0–100.0)
Platelets: 295 10*3/uL (ref 150–400)
RBC: 2.88 MIL/uL — ABNORMAL LOW (ref 4.22–5.81)
RDW: 14.1 % (ref 11.5–15.5)
WBC: 21.6 10*3/uL — ABNORMAL HIGH (ref 4.0–10.5)
nRBC: 0 % (ref 0.0–0.2)

## 2018-07-24 LAB — MAGNESIUM: Magnesium: 2.2 mg/dL (ref 1.7–2.4)

## 2018-07-24 LAB — PHOSPHORUS: Phosphorus: 3.1 mg/dL (ref 2.5–4.6)

## 2018-07-24 MED ORDER — PRO-STAT SUGAR FREE PO LIQD
30.0000 mL | Freq: Two times a day (BID) | ORAL | Status: DC
  Administered 2018-07-24 – 2018-07-25 (×3): 30 mL
  Filled 2018-07-24 (×3): qty 30

## 2018-07-24 MED ORDER — NON FORMULARY: 1000.0000 mL | Status: DC

## 2018-07-24 MED ORDER — ROCURONIUM BROMIDE 50 MG/5ML IV SOLN
50.0000 mg | Freq: Once | INTRAVENOUS | Status: AC
  Administered 2018-07-24: 50 mg via INTRAVENOUS

## 2018-07-24 MED ORDER — POTASSIUM CHLORIDE 20 MEQ/15ML (10%) PO SOLN
20.0000 meq | Freq: Once | ORAL | Status: AC
  Administered 2018-07-24: 20 meq
  Filled 2018-07-24: qty 15

## 2018-07-24 MED ORDER — PANTOPRAZOLE SODIUM 40 MG PO PACK
40.0000 mg | PACK | Freq: Every day | ORAL | Status: DC
  Administered 2018-07-24 – 2018-07-25 (×2): 40 mg
  Filled 2018-07-24 (×2): qty 20

## 2018-07-24 MED ORDER — PROPOFOL 500 MG/50ML IV EMUL
50.0000 mg | Freq: Once | INTRAVENOUS | Status: AC
  Administered 2018-07-24: 50 mg via INTRAVENOUS

## 2018-07-24 MED ORDER — GLUCERNA 1.5 CAL PO LIQD
1000.0000 mL | ORAL | Status: DC
  Administered 2018-07-24: 1000 mL
  Filled 2018-07-24 (×4): qty 1000

## 2018-07-24 MED ORDER — VANCOMYCIN HCL 10 G IV SOLR
1750.0000 mg | INTRAVENOUS | Status: DC
  Administered 2018-07-24: 1750 mg via INTRAVENOUS
  Filled 2018-07-24 (×2): qty 1750

## 2018-07-24 MED ORDER — IPRATROPIUM-ALBUTEROL 0.5-2.5 (3) MG/3ML IN SOLN
3.0000 mL | Freq: Four times a day (QID) | RESPIRATORY_TRACT | Status: DC
  Administered 2018-07-25 (×2): 3 mL via RESPIRATORY_TRACT
  Filled 2018-07-24 (×2): qty 3

## 2018-07-24 MED ORDER — SODIUM CHLORIDE 0.9 % IV SOLN
2.0000 g | Freq: Three times a day (TID) | INTRAVENOUS | Status: DC
  Administered 2018-07-24 – 2018-07-25 (×3): 2 g via INTRAVENOUS
  Filled 2018-07-24 (×6): qty 2

## 2018-07-24 MED ORDER — ATORVASTATIN CALCIUM 80 MG PO TABS
80.0000 mg | ORAL_TABLET | Freq: Every day | ORAL | Status: DC
  Administered 2018-07-24 – 2018-08-01 (×7): 80 mg via ORAL
  Filled 2018-07-24 (×7): qty 1

## 2018-07-24 NOTE — Progress Notes (Signed)
  Echocardiogram 2D Echocardiogram has been performed.  Delcie Roch 07/24/2018, 8:34 AM

## 2018-07-24 NOTE — Progress Notes (Signed)
I have continued to follow patient's workup and treatment throughout his hospitalization, and I very much appreciate everyone's role in Mr. Devary' care. Of note, I did call patient's contact, Angie, earlier today, as she is the person identified as his emergency contact, as per the medical records from my office. She also is the person that I spoke with following his previous surgeries from last week.  I informed her of all the relevant highlights regarding the events leading up to Mr. Kocian' admission, and what we have learned thus far since his admission. All of her questions were entered to the best of my ability. She sounded very appreciative of the phone call. I did let her know that I will continue to keep her posted on additional relevant highlights  \related to his hospital course as they become available.

## 2018-07-24 NOTE — Progress Notes (Signed)
Tele visit provided with patient and friend Angie.

## 2018-07-24 NOTE — Procedures (Signed)
Bronchoscopy Procedure Note Corey Li [id number] October 20, 1970  Procedure: Bronchoscopy Indications: Diagnostic evaluation of the airways and Remove secretions Pt was desaturating on mechanical ventilation and bagging despite FiO2 100% and tube in satisfactory position  Procedure Details Consent: Unable to obtain consent because of emergent medical necessity. Time Out: Verified patient identification, verified procedure, site/side was marked, verified correct patient position, special equipment/implants available, medications/allergies/relevent history reviewed, required imaging and test results available.  Performed  In preparation for procedure, patient was given 100% FiO2 and bronchoscope lubricated. Sedation: propofol gtt  Airway entered and the following bronchi were examined: RUL, RML, RLL, LUL, LLL and Bronchi.   Thick purulent secretions seen from carina and extending into the left main and LLL difficult to suction from the lower lobes, erythematous swollen mucosa in the left lower lobe.  RUL, RML and RLL patent no erythema no thick secretions all lobes patent. Procedures performed: Brushings performed Bronchoscope removed.  , Patient placed back on 100% FiO2 at conclusion of procedure.    Evaluation Hemodynamic Status: BP stable throughout; O2 sats: stable throughout Patient's Current Condition: stable Specimens:  None Complications: No apparent complications Patient did tolerate procedure well.   Corey Li 07/24/2018

## 2018-07-24 NOTE — Progress Notes (Signed)
Nutrition Follow-up  DOCUMENTATION CODES:   Not applicable  INTERVENTION:   Tube Feeding:  Glucerna 1.5 at 40 ml/hr Pro-Stat 30 mL BID Provides 109 g of protein, 1640 kcals, 730 mL of free water  TF regimen and propofol at current rate providing 1874 total kcal/day (103 % of kcal needs)  NUTRITION DIAGNOSIS:   Inadequate oral intake related to acute illness as evidenced by NPO status.  Being addressed via TF   GOAL:   Patient will meet greater than or equal to 90% of their needs  Progressing  MONITOR:   Vent status, Labs, Weight trends, TF tolerance  REASON FOR ASSESSMENT:   Ventilator    ASSESSMENT:   48 yo male admitted with DKA, acute encephalopathy with right sided weakness, acute respiratory failure requiring intubation. PMH includes DM, HTN, HLD, GERD  Patient is currently intubated on ventilator support MV: 8.8 L/min Temp (24hrs), Avg:99.4 F (37.4 C), Min:99 F (37.2 C), Max:99.7 F (37.6 C)  Propofol: 8.9 ml/hr   Labs: reviewed; phosphorus improved to 3.1 (wdl) Meds: ss novolog, levemir   Diet Order:   Diet Order            Diet NPO time specified  Diet effective now              EDUCATION NEEDS:   Not appropriate for education at this time  Skin:  Skin Assessment: Reviewed RN Assessment  Last BM:  4/9  Height:   Ht Readings from Last 1 Encounters:  07/22/18 5\' 9"  (1.753 m)    Weight:   Wt Readings from Last 1 Encounters:  07/24/18 73.8 kg    Ideal Body Weight:  72.7 kg  BMI:  Body mass index is 24.03 kg/m.  Estimated Nutritional Needs:   Kcal:  1821 kcals   Protein:  100-125 g   Fluid:  >/= 1.8 L  Romelle Starcher MS, RD, LDN, CNSC [phone] Pager  [phone] Weekend/On-Call Pager

## 2018-07-24 NOTE — Progress Notes (Signed)
Patient's vent alarm was going off so I entered room. RN and NT was in the middle of cleaning patient up. Patient was gurgling- checked tube placement, patient was supposed to be at 22cm and was out at 18cm. Advanced tube to 22cm but patient still had cuff leak. Patient then started desating, etco2 detector placed on with no color change. Patient was not getting volumes on ventilator and at this time patient started gasping and sats dropped to the 60's. Tube taken out at this time because could not bag patient and sats dropped to the teens. Bagged patient and sat's immediately increased to 100%. MD arrived and reintubated patient with 7.5cm tube at 23cm. Placed patient back on ventilator and patient was still gurgling with no volumes. At this time MD bronched patient and increased tube to 26cm. Patient placed back on previous settings and is now getting volumes. Patient looks more comfortable at this time. RT will continue to monitor.

## 2018-07-24 NOTE — Progress Notes (Signed)
Pharmacy Antibiotic Note  Corey Li is a 48 y.o. male admitted on 07/22/2018 with sepsis. Pharmacy has been consulted for vancomycin and cefepime dosing. Pt is afebrile but WBC is elevated at 21.6. SCr has improved, down to 1.15. LA 2.2.  Plan: Vancomycin 1750mg  IV Q24H. Goal AUC 400-550. Expected AUC: 478 SCr used: 1.15 Cefepime 2gm IV Q8H F/u renal fxn, C&S, clinical status and peak/trough at SS  Height: 5\' 9"  (175.3 cm) Weight: 162 lb 11.2 oz (73.8 kg) IBW/kg (Calculated) : 70.7  No data recorded.  Recent Labs  Lab 07/22/18 1234  07/22/18 1331 07/22/18 1627 07/22/18 1956 07/22/18 2344 07/23/18 0448 07/23/18 0834 07/24/18 0014  WBC 18.2*  --   --   --   --   --  18.2*  --  21.6*  CREATININE 3.00*   < >  --   --  2.67* 2.30* 1.95* 1.74* 1.15  LATICACIDVEN  --   --  2.9* 2.2*  --   --   --   --   --    < > = values in this interval not displayed.    Estimated Creatinine Clearance: 77.7 mL/min (by C-G formula based on SCr of 1.15 mg/dL).    No Known Allergies  Antimicrobials this admission: Vanc 4/8>> Cefepime 4/8>>  Dose adjustments this admission: 4/10: Increased Scr down to 1.15- 1750 mg IV q24h  Microbiology results: 4/8 BCx:  4/8 UCx: NG 4/8 RVP: negative 4/9 mrsa: negative 4/9: TA: few budding yeast, few GPR, few GPC  Thank you for allowing pharmacy to be a part of this patient's care.  Marcelino Freestone, PharmD PGY2 Cardiology Pharmacy Resident Please check AMION for all Pharmacist numbers by unit 07/24/2018 7:41 AM

## 2018-07-24 NOTE — Progress Notes (Signed)
Lower extremity venous has been completed.   Preliminary results in CV Proc.   Blanch Media 07/24/2018 8:38 AM

## 2018-07-24 NOTE — Progress Notes (Addendum)
NAMEOndra Li, MRN:  [id number], DOB:  November 18, 1970, LOS: 2 ADMISSION DATE:  07/22/2018, CONSULTATION DATE:  07/22/2018 REFERRING MD:  Corey Li, CHIEF COMPLAINT:  AMS  Brief History   24 yoM DMT2, s/p recent A/P-CDF C5- C7 by Ortho on 4/1 and 4/2 presenting from hotel room with AMS and hypoxic.  Some right sided weakness in ER, code stroke called, CTH neg.  CT cervical with stable postop changes.  Afebrile. Glucose 958.  Intubated for airway protection, hypotensive post intubation.  PCCM to admit for DKA.   History of present illness   Tobacco abuse, DM type 2, HTN, HLD, hypothyroid, sleep apnea, GERD, sleep, anxiety, depression  Significant Hospital Events   4/1/ 4/2 A/P cervical decompression and fusion by Corey Li  Consults:  Neurology   Procedures:  4/8 ETT >>  Significant Diagnostic Tests:  4/8 [facility] code stroke >> neg  4/8 CT cervical  >>Image quality degraded by moderate motion.  Corpectomy at C6. Anterior and posterior fusion from C5 through C7.  Hardware in good position and unchanged from recent study.  Postop prevertebral soft tissue swelling related to recent surgery and unchanged from the prior study.  4/8 CXR >>Tube positions as described without pneumothorax. Note that nasogastric tube tip is at the gastroesophageal junction. Advise advancing nasogastric tube 8-10 cm. Areas of atelectatic change in the right mid lung and bilateral base regions. No consolidation. Heart size within normal limits.  4/8 CTA head/ neck >>  1. No emergent large vessel occlusion.  2. Widely patent cervical carotid and vertebral arteries. 3. Diffuse irregular narrowing of the small to medium-sized intracranial arteries. While this could be partly artifactual, a diffuse non-atherosclerotic process such as vasculitis is a concern. 4. Patchy ground-glass opacities in both lung apices, nonspecific. Atypical infection (including viral pneumonia) and noninfectious etiologies are possible. 5.  Postoperative changes from recent cervical spine fusion as previously described. Postoperative infection is not excluded.  4/9 MRI brain >> 1. Multifocal acute ischemia throughout multiple vascular territories in a pattern most consistent with emboli from a central cardiac or aortic source. The largest areas of ischemia are within the posterior cerebral artery territories. 2. No acute hemorrhage, herniation or hydrocephalus. 3. 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 4. No proximal intracranial arterial occlusion.  4/10 TTE >>  Micro Data:  4/8 BC x 2 >>  1/2 coag neg- stap epi on BCID likely contaminant  4/8 UC >> neg 4/8 RVP >> negative 4/9 trach asp >> 4/9 MRSA PCR >> neg  Antimicrobials:  4/8 vanc >> 4/9 4/8 cefepime >>  Interim history/subjective:  Thick secretions overnight.  Had ETT displacement overnight requiring intubation and bronch for thick secretions.   MRI yest noted for multiple infarcts - most notably bilateral PCA territories and left internal capsule- suspected to be cardioembolic vs vasculitis   Afebrile Net +1.7L  QTc 0.540  Objective   Blood pressure 140/80, pulse 61, temperature 99 F (37.2 C), temperature source Axillary, resp. rate 17, height  (1.753 m), weight 73.8 kg, SpO2 99 %.    Vent Mode: PRVC FiO2 (%):  [30 %-100 %] 50 % Set Rate:  [17 bmp] 17 bmp Vt Set:  [560 mL] 560 mL PEEP:  [5 cmH20] 5 cmH20 Plateau Pressure:  [13 cmH20-22 cmH20] 15 cmH20   Intake/Output Summary (Last 24 hours) at 07/24/2018 0905 Last data filed at 07/24/2018 0841 Gross per 24 hour  Intake 2581.47 ml  Output 2870 ml  Net -288.53 ml  Filed Weights   07/22/18 1424 07/23/18 0500 07/24/18 0423  Weight: 85 kg 75.7 kg 73.8 kg   Examination: General:  Critically ill male on MV in NAD HEENT: MM pink/moist, ETT/ OGT, pupil 4 reactive Neuro:  Moves left spontaneously, not f/c, is withdrawing to pain in RUE/RLE today  CV: SR, no murmur PULM:  even/non-labored, not breathing over set rate, right clear, diminished LUL, coarse LL  GI: soft, non-tender, bs active  Extremities: warm/dry, trace LE edema- right ankle bracelet since removed  Skin: no rashes   Resolved Hospital Problem list    Assessment & Plan:   Acute hypoxic respiratory failure R/o aspiration pna  Possible mucous plugging 4/9 Tobacco abuse  - CXR post intubation, pre bronch this morning with increasing left lobar pneumonia, ett stable, OGT below diaphragm  P:  Full MV support, PRVC 8 cc/kg, rate 17 CXR in am Wean FiO2/ PEEP for sat goal >94% VAP bundle Daily SBT trial Will need to ensure cuff leak prior to extubation SLP post extubation Will wean propofol gtt, supplement with small fentanyl boluses, unable to switch to precedex due to prolonged QTc  Stroke- multiple infarcts in bilateral PCA territories and left internal capsule - right sided deficits - ddx cardioembolic vs vasculitis  P:  Neurology following, appreciate assistance.   Ongoing neuro exams  Stroke workup pending- TTE, lipid panel, HgbA1c Allow permissive HTN up to 180/110 Continue statin  DKA- resolved  Hx DMT2 P:  CBG q 4 SSI resistant Levemir 18 units BID Will need to add TF coverage and possible increase levemir if TF started if not extubated   AKI- resolved  AGMA- resolved Hyperkalemia- resolved  P:  D/c IVF  KCL 20 meq x 1 Trend BMP/ mag / phos/ UOP   Leukocytosis  Remains afebrile, suspected aspiration pna  P:  Follow cultures D/c vanc  Continue cefepime PCT in am  Trend WBC/ fever curve   S/p A/ P CDF C5-C7, on 4/1/ 4/2 - CT cervical shows no fluid collections, stable postop prevertebral soft tissue swelling P:  Corey Li following, would likely need rehab/ SNF on discharge  Hypothyroidism P:  Daily synthroid 175 mcg  Hx HTN, HLD Abnormal EKG Prolonged QTc  P:  Continue home norvasc  D/c IVF Monitor daily QTc  Allow some permissive HTN as above  Hold home lisinopril and lasix  continue home lipitor Pending TTE  Anxiety/ depression P:  Hold home prozac, valium  GERD P:  PPI  Best practice:  Diet: NPO, start TF if not extubated this afternoon Pain/Anxiety/Delirium protocol (if indicated): prn fentanyl/ versed if needed  VAP protocol (if indicated): yes DVT prophylaxis: lovenox GI prophylaxis: PPI per tube Glucose control: SSI  Mobility: BR Code Status: Full  Family Communication: only next of contact listed is a friend Frederik Schmidt, who lives in [place], she knows that he has a brother but does not know any further information on him, even his name, parents are both deceased.  Will contact CSW to try and establish NOK.  Disposition: ICU  Labs   CBC: Recent Labs  Lab 07/22/18 1234  07/22/18 1621 07/23/18 0448 07/23/18 0946 07/23/18 1358 07/24/18 0014  WBC 18.2*  --   --  18.2*  --   --  21.6*  NEUTROABS 15.3*  --   --   --   --   --   --   HGB 10.7*   < > 13.9 9.1* 8.8* 14.6 9.3*  HCT 35.8*   < >  41.0 25.6* 26.0* 43.0 25.7*  MCV 104.1*  --   --  86.5  --   --  89.2  PLT 482*  --   --  348  --   --  295   < > = values in this interval not displayed.    Basic Metabolic Panel: Recent Labs  Lab 07/22/18 1627 07/22/18 1956 07/22/18 2344 07/23/18 0448 07/23/18 0834 07/23/18 0946 07/23/18 1358 07/24/18 0014  NA  --  133* 135 140 143 144 143 140  K  --  4.9 3.5 3.7 3.5 3.3* 3.7 3.6  CL  --  94* 101 103 104  --   --  103  CO2  --  14* 20* 24 26  --   --  25  GLUCOSE  --  691* 478* 224* 101*  --   --  166*  BUN  --  62* 59* 53* 47*  --   --  24*  CREATININE  --  2.67* 2.30* 1.95* 1.74*  --   --  1.15  CALCIUM  --  8.9 8.5* 9.0 8.9  --   --  8.4*  MG 2.6*  --   --  2.4  --   --   --  2.2  PHOS 5.8*  --   --  <1.0* <1.0*  --   --  3.1  3.2   GFR: Estimated Creatinine Clearance: 77.7 mL/min (by C-G formula based on SCr of 1.15 mg/dL). Recent Labs  Lab 07/22/18 1234 07/22/18 1331 07/22/18 1627 07/23/18  0448 07/24/18 0014  PROCALCITON  --   --  0.77  --   --   WBC 18.2*  --   --  18.2* 21.6*  LATICACIDVEN  --  2.9* 2.2*  --   --     Liver Function Tests: Recent Labs  Lab 07/22/18 1234 07/24/18 0014  AST 40  --   ALT 35  --   ALKPHOS 121  --   BILITOT 2.1*  --   PROT 8.1  --   ALBUMIN 4.1 3.1*   Recent Labs  Lab 07/22/18 1627  LIPASE 73*  AMYLASE 172*   No results for input(s): AMMONIA in the last 168 hours.  ABG    Component Value Date/Time   PHART 7.515 (H) 07/23/2018 1358   PCO2ART 38.6 07/23/2018 1358   PO2ART 380.0 (H) 07/23/2018 1358   HCO3 31.1 (H) 07/23/2018 1358   TCO2 32 07/23/2018 1358   ACIDBASEDEF 17.0 (H) 07/22/2018 1621   O2SAT 100.0 07/23/2018 1358     Coagulation Profile: Recent Labs  Lab 07/22/18 1234  INR 1.2    Cardiac Enzymes: Recent Labs  Lab 07/22/18 1627  TROPONINI <0.03    HbA1C: Hgb A1c MFr Bld  Date/Time Value Ref Range Status  07/13/2018 11:18 AM 10.9 (H) 4.8 - 5.6 % Final    Comment:    (NOTE) Pre diabetes:          5.7%-6.4% Diabetes:              >6.4% Glycemic control for   <7.0% adults with diabetes     CBG: Recent Labs  Lab 07/23/18 1651 07/23/18 1943 07/24/18 0011 07/24/18 0327 07/24/18 0738  GLUCAP 212* 189* 160* 134* 114*     Critical care time: 45 mins    Posey Boyer, MSN, AGACNP-BC [facility] Pgr: [phone] or if no answer [phone] 07/24/2018, 9:05 AM

## 2018-07-24 NOTE — Procedures (Signed)
Intubation Procedure Note Corey Li [id number] 1970/08/25  Procedure: Intubation Indications: ETT moved proximally and could not be successfully advanced   Procedure Details Consent: Unable to obtain consent because of emergent medical necessity. Time Out: Verified patient identification, verified procedure, site/side was marked, verified correct patient position, special equipment/implants available, medications/allergies/relevent history reviewed, required imaging and test results available.  Performed  Maximum sterile technique was used including gloves, hand hygiene and mask.  MAC and 4  Propofol 5 cc Rcouronium 50 Very thick secretions  Evaluation Hemodynamic Status: BP stable throughout; O2 sats: stable throughout Patient's Current Condition: stable Complications: No apparent complications Patient did tolerate procedure well. Chest X-ray ordered to verify placement.  CXR: pending.   Corey Li 07/24/2018

## 2018-07-24 NOTE — Progress Notes (Signed)
Patient started coughing violently during bath and RR was 40/min. 100 mcg Fentanyl given due to vent dyssynchrony. RN sat patient up and suctioned. Patient started gurgling. RT entered room. ET tube was at 18 from previous 22. ET tube advanced by RT. ELink paged. Patient started desatting and was bagged. On call MD arrived. RSI protocol started. Patient was intubated at 0119, SpO2 100% after re-intubation.

## 2018-07-24 NOTE — TOC Progression Note (Signed)
Transition of Care [facility]) - Progression Note    Patient Details  Name: Corey Li MRN: [id number] Date of Birth: 1970-08-24  Transition of Care [facility]) CM/SW Contact  Abigail Butts, LCSW Phone Number: 07/24/2018, 9:48 AM  Clinical Narrative: CSW received call from [facility] with [facility]. She received a referral from patient's worker's comp agency. She is requesting clinical information to review. Informed Dorene Grebe that patient not medically ready for therapy evaluations at this time.   TOC will follow for patient progress and full assessment with patient and family will be completed as appropriate. If plan remains for patient to go to SNF, the following contact information can be used to reach [facility] regarding referral: Dorene Grebe (P) [phone] (F) [phone]          Expected Discharge Plan and Services  uncertain at this time                                   Social Determinants of Health (SDOH) Interventions    Readmission Risk Interventions No flowsheet data found.

## 2018-07-24 NOTE — Progress Notes (Addendum)
STROKE TEAM PROGRESS NOTE   INTERVAL HISTORY Patient still intubated, sedated, not responsive to voice, able to localize the pain on the left side, however not moving on the right side.  Blood culture positive, pending final report.  Vitals:   07/24/18 0900 07/24/18 1034 07/24/18 1128 07/24/18 1200  BP: (!) 148/85 132/77 133/79   Pulse: 64  63   Resp: 17  17   Temp:    99.7 F (37.6 C)  TempSrc:      SpO2: 100%  100%   Weight:      Height:        CBC:  Recent Labs  Lab 07/22/18 1234  07/23/18 0448  07/23/18 1358 07/24/18 0014  WBC 18.2*  --  18.2*  --   --  21.6*  NEUTROABS 15.3*  --   --   --   --   --   HGB 10.7*   < > 9.1*   < > 14.6 9.3*  HCT 35.8*   < > 25.6*   < > 43.0 25.7*  MCV 104.1*  --  86.5  --   --  89.2  PLT 482*  --  348  --   --  295   < > = values in this interval not displayed.    Basic Metabolic Panel:  Recent Labs  Lab 07/23/18 0448 07/23/18 0834  07/23/18 1358 07/24/18 0014  NA 140 143   < > 143 140  K 3.7 3.5   < > 3.7 3.6  CL 103 104  --   --  103  CO2 24 26  --   --  25  GLUCOSE 224* 101*  --   --  166*  BUN 53* 47*  --   --  24*  CREATININE 1.95* 1.74*  --   --  1.15  CALCIUM 9.0 8.9  --   --  8.4*  MG 2.4  --   --   --  2.2  PHOS <1.0* <1.0*  --   --  3.1  3.2   < > = values in this interval not displayed.   Lipid Panel:     Component Value Date/Time   CHOL 148 07/24/2018 0014   TRIG 195 (H) 07/24/2018 0014   HDL 37 (L) 07/24/2018 0014   CHOLHDL 4.0 07/24/2018 0014   VLDL 39 07/24/2018 0014   LDLCALC 72 07/24/2018 0014   HgbA1c:  Lab Results  Component Value Date   HGBA1C 10.9 (H) 07/13/2018   Urine Drug Screen:     Component Value Date/Time   LABOPIA NONE DETECTED 07/22/2018 1234   COCAINSCRNUR NONE DETECTED 07/22/2018 1234   LABBENZ POSITIVE (A) 07/22/2018 1234   AMPHETMU NONE DETECTED 07/22/2018 1234   THCU NONE DETECTED 07/22/2018 1234   LABBARB NONE DETECTED 07/22/2018 1234    Alcohol Level     Component  Value Date/Time   ETH <10 07/22/2018 1627    IMAGING Ct Angio Head W Or Wo Contrast  Result Date: 07/22/2018 CLINICAL DATA:  Altered mental status. Right-sided weakness. Recent cervical spine surgery. EXAM: CT ANGIOGRAPHY HEAD AND NECK TECHNIQUE: Multidetector CT imaging of the head and neck was performed using the standard protocol during bolus administration of intravenous contrast. Multiplanar CT image reconstructions and MIPs were obtained to evaluate the vascular anatomy. Carotid stenosis measurements (when applicable) are obtained utilizing NASCET criteria, using the distal internal carotid diameter as the denominator. CONTRAST:  OMNIPAQUE IOHEXOL 350 MG/ML SOLN COMPARISON:  Head and  cervical spine CTs 07/22/2018 FINDINGS: CTA NECK FINDINGS Aortic arch: Normal variant aortic arch branching pattern with common origin of the brachiocephalic and left common carotid arteries. Mild arch atherosclerosis. Widely patent arch vessel origins. Right carotid system: Patent without evidence of stenosis or dissection. Left carotid system: Patent without evidence of stenosis or dissection. Vertebral arteries: Patent without evidence of stenosis or dissection. Minimal calcified plaque at the right vertebral artery origin. Minimally dominant left vertebral artery. Skeleton: Postoperative changes from recent C5-C7 anterior and posterior fusion with C6 corpectomy as described on earlier cervical spine CT. Scattered postoperative stranding and gas in the soft tissues of the neck including mild prevertebral edema and/or fluid as well as ventral epidural gas at C5-6 and gas in the posterior operative bed from C5-C7. Other neck: Endotracheal and enteric tubes present. Upper chest: Patchy peripheral ground-glass opacities in the included portions of both upper lobes, some of which are rounded. Review of the MIP images confirms the above findings CTA HEAD FINDINGS Anterior circulation: The internal carotid arteries are  widely patent from skull base to carotid termini. ACAs and MCAs are patent without evidence of proximal branch occlusion or significant A1 or M1 stenosis. There is diffuse branch vessel irregular narrowing and distal attenuation. No aneurysm is identified. Posterior circulation: The intracranial vertebral arteries are widely patent to the basilar. The basilar artery is widely patent a left PICA, right AICA, and bilateral SCAs are visualized but not well evaluated due to their small size. Posterior communicating arteries are not identified and may be diminutive or absent. The PCAs are patent with diffuse irregularity including multiple apparent severe P2 stenoses bilaterally. No aneurysm is identified. Venous sinuses: Patent. Anatomic variants: None. Delayed phase: Not performed. Review of the MIP images confirms the above findings IMPRESSION: 1. No emergent large vessel occlusion. 2. Widely patent cervical carotid and vertebral arteries. 3. Diffuse irregular narrowing of the small to medium-sized intracranial arteries. While this could be partly artifactual, a diffuse non-atherosclerotic process such as vasculitis is a concern. 4. Patchy ground-glass opacities in both lung apices, nonspecific. Atypical infection (including viral pneumonia) and noninfectious etiologies are possible. 5. Postoperative changes from recent cervical spine fusion as previously described. Postoperative infection is not excluded. These results were called by telephone at the time of interpretation on 07/22/2018 at 4:59 pm to Dr. Craige Cotta, who verbally acknowledged these results. Electronically Signed   By: Sebastian Ache M.D.   On: 07/22/2018 17:01   Dg Abd 1 View  Result Date: 07/24/2018 CLINICAL DATA:  OG tube placement. EXAM: ABDOMEN - 1 VIEW COMPARISON:  Radiograph yesterday. FINDINGS: Tip and side port of the enteric tube below the diaphragm in the stomach. Nonobstructive bowel gas pattern in the upper abdomen. IMPRESSION: Tip and side port  of the enteric tube below the diaphragm in the stomach. Electronically Signed   By: Narda Rutherford M.D.   On: 07/24/2018 02:32   Dg Abd 1 View  Result Date: 07/23/2018 CLINICAL DATA:  NG tube placement. EXAM: ABDOMEN - 1 VIEW COMPARISON:  Radiograph yesterday at 1329 hour FINDINGS: The tip of the enteric tube is now below the diaphragm in the stomach, the side port remains in the distal esophagus. Recommend advancement of 5-10 cm for optimal placement. Nonobstructed bowel-gas pattern in the included upper abdomen. IMPRESSION: Tip of the enteric tube below the diaphragm in the stomach, side port remains in the distal esophagus. Recommend advancement of 5-10 cm for optimal placement. Electronically Signed   By: Ivette Loyal.D.  On: 07/23/2018 02:36   Ct Angio Neck W Or Wo Contrast  Result Date: 07/22/2018 CLINICAL DATA:  Altered mental status. Right-sided weakness. Recent cervical spine surgery. EXAM: CT ANGIOGRAPHY HEAD AND NECK TECHNIQUE: Multidetector CT imaging of the head and neck was performed using the standard protocol during bolus administration of intravenous contrast. Multiplanar CT image reconstructions and MIPs were obtained to evaluate the vascular anatomy. Carotid stenosis measurements (when applicable) are obtained utilizing NASCET criteria, using the distal internal carotid diameter as the denominator. CONTRAST:  OMNIPAQUE IOHEXOL 350 MG/ML SOLN COMPARISON:  Head and cervical spine CTs 07/22/2018 FINDINGS: CTA NECK FINDINGS Aortic arch: Normal variant aortic arch branching pattern with common origin of the brachiocephalic and left common carotid arteries. Mild arch atherosclerosis. Widely patent arch vessel origins. Right carotid system: Patent without evidence of stenosis or dissection. Left carotid system: Patent without evidence of stenosis or dissection. Vertebral arteries: Patent without evidence of stenosis or dissection. Minimal calcified plaque at the right vertebral  artery origin. Minimally dominant left vertebral artery. Skeleton: Postoperative changes from recent C5-C7 anterior and posterior fusion with C6 corpectomy as described on earlier cervical spine CT. Scattered postoperative stranding and gas in the soft tissues of the neck including mild prevertebral edema and/or fluid as well as ventral epidural gas at C5-6 and gas in the posterior operative bed from C5-C7. Other neck: Endotracheal and enteric tubes present. Upper chest: Patchy peripheral ground-glass opacities in the included portions of both upper lobes, some of which are rounded. Review of the MIP images confirms the above findings CTA HEAD FINDINGS Anterior circulation: The internal carotid arteries are widely patent from skull base to carotid termini. ACAs and MCAs are patent without evidence of proximal branch occlusion or significant A1 or M1 stenosis. There is diffuse branch vessel irregular narrowing and distal attenuation. No aneurysm is identified. Posterior circulation: The intracranial vertebral arteries are widely patent to the basilar. The basilar artery is widely patent a left PICA, right AICA, and bilateral SCAs are visualized but not well evaluated due to their small size. Posterior communicating arteries are not identified and may be diminutive or absent. The PCAs are patent with diffuse irregularity including multiple apparent severe P2 stenoses bilaterally. No aneurysm is identified. Venous sinuses: Patent. Anatomic variants: None. Delayed phase: Not performed. Review of the MIP images confirms the above findings IMPRESSION: 1. No emergent large vessel occlusion. 2. Widely patent cervical carotid and vertebral arteries. 3. Diffuse irregular narrowing of the small to medium-sized intracranial arteries. While this could be partly artifactual, a diffuse non-atherosclerotic process such as vasculitis is a concern. 4. Patchy ground-glass opacities in both lung apices, nonspecific. Atypical infection  (including viral pneumonia) and noninfectious etiologies are possible. 5. Postoperative changes from recent cervical spine fusion as previously described. Postoperative infection is not excluded. These results were called by telephone at the time of interpretation on 07/22/2018 at 4:59 pm to Dr. Craige Cotta, who verbally acknowledged these results. Electronically Signed   By: Sebastian Ache M.D.   On: 07/22/2018 17:01   Mr Maxine Glenn Head Wo Contrast  Result Date: 07/23/2018 CLINICAL DATA:  Stroke follow-up.  Right-sided paralysis. EXAM: MRI HEAD WITHOUT AND WITH CONTRAST MRA HEAD WITHOUT CONTRAST TECHNIQUE: Multiplanar, multiecho pulse sequences of the brain and surrounding structures were obtained without and with intravenous contrast. Angiographic images of the head were obtained using MRA technique without contrast. CONTRAST:  8 mL Gadavist COMPARISON:  Brain MRI 06/29/2018 CTA head neck 07/22/2018 FINDINGS: MRI HEAD FINDINGS BRAIN: Multifocal bilateral  acute ischemia scattered throughout both hemispheres and multiple vascular territories, including the right cerebellum. The largest areas of ischemia are in the posterior cerebral artery territories. 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 There is moderate edema within the affected areas. There is mild leptomeningeal contrast enhancement within both PCA territories and within the left basal ganglia. The midline structures are normal. The white matter signal is normal for the patient's age. The CSF spaces are normal for age, with no hydrocephalus. Susceptibility-sensitive sequences show no chronic microhemorrhage or superficial siderosis. SKULL AND UPPER CERVICAL SPINE: The visualized skull base, calvarium, upper cervical spine and extracranial soft tissues are normal. SINUSES/ORBITS: No fluid levels or advanced mucosal thickening. No mastoid or middle  ear effusion. The orbits are normal. MRA HEAD FINDINGS POSTERIOR CIRCULATION: --Basilar artery: Normal. --Posterior cerebral arteries: Patent proximally. Multifocal moderate-to-severe stenosis of the distal P2 segments. --Superior cerebellar arteries: Normal. --Inferior cerebellar arteries: Normal anterior and posterior inferior cerebellar arteries. ANTERIOR CIRCULATION: --Intracranial internal carotid arteries: Normal. --Anterior cerebral arteries: Normal. Both A1 segments are present. Patent anterior communicating artery. --Middle cerebral arteries: Normal. --Posterior communicating arteries: Absent or diminutive IMPRESSION: 1. Multifocal acute ischemia throughout multiple vascular territories in a pattern most consistent with emboli from a central cardiac or aortic source. The largest areas of ischemia are within the posterior cerebral artery territories. 2. No acute hemorrhage, herniation or hydrocephalus. 3. 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 4. No proximal intracranial arterial occlusion. Electronically Signed   By: Deatra Robinson M.D.   On: 07/23/2018 16:08   Mr Laqueta Jean ZO Contrast  Result Date: 07/23/2018 CLINICAL DATA:  Stroke follow-up.  Right-sided paralysis. EXAM: MRI HEAD WITHOUT AND WITH CONTRAST MRA HEAD WITHOUT CONTRAST TECHNIQUE: Multiplanar, multiecho pulse sequences of the brain and surrounding structures were obtained without and with intravenous contrast. Angiographic images of the head were obtained using MRA technique without contrast. CONTRAST:  8 mL Gadavist COMPARISON:  Brain MRI 06/29/2018 CTA head neck 07/22/2018 FINDINGS: MRI HEAD FINDINGS BRAIN: Multifocal bilateral acute ischemia scattered throughout both hemispheres and multiple vascular territories, including the right cerebellum. The largest areas of ischemia are in the posterior cerebral artery territories. Infarct along the dorsal aspect of the left basal ganglia, in close proximity to the posterior  limb of the internal capsule could account for the right-sided weakness. There is moderate edema within the affected areas. There is mild leptomeningeal contrast enhancement within both PCA territories and within the left basal ganglia. The midline structures are normal. The white matter signal is normal for the patient's age. The CSF spaces are normal for age, with no hydrocephalus. Susceptibility-sensitive sequences show no chronic microhemorrhage or superficial siderosis. SKULL AND UPPER CERVICAL SPINE: The visualized skull base, calvarium, upper cervical spine and extracranial soft tissues are normal. SINUSES/ORBITS: No fluid levels or advanced mucosal thickening. No mastoid or middle ear effusion. The orbits are normal. MRA HEAD FINDINGS POSTERIOR CIRCULATION: --Basilar artery: Normal. --Posterior cerebral arteries: Patent proximally. Multifocal moderate-to-severe stenosis of the distal P2 segments. --Superior cerebellar arteries: Normal. --Inferior cerebellar arteries: Normal anterior and posterior inferior cerebellar arteries. ANTERIOR CIRCULATION: --Intracranial internal carotid arteries: Normal. --Anterior cerebral arteries: Normal. Both A1 segments are present. Patent anterior communicating artery. --Middle cerebral arteries: Normal. --Posterior communicating arteries: Absent or diminutive IMPRESSION: 1. Multifocal acute ischemia throughout multiple vascular territories in a pattern most consistent with emboli from a central cardiac or  aortic source. The largest areas of ischemia are within the posterior cerebral artery territories. 2. No acute hemorrhage, herniation or hydrocephalus. 3. 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 4. No proximal intracranial arterial occlusion. Electronically Signed   By: Deatra Robinson M.D.   On: 07/23/2018 16:08   Dg Chest Port 1 View  Result Date: 07/24/2018 CLINICAL DATA:  Intubation. EXAM: PORTABLE CHEST 1 VIEW COMPARISON:  Radiograph yesterday.  FINDINGS: Endotracheal tube tip at the thoracic inlet 3.2 cm from the carina. Progressive volume loss in the left hemithorax with increasing basilar and mid lung patchy opacity. Progressive vague opacity in the left upper lung zone, suggesting partial lobar collapse. Right lung base atelectasis. Heart is normal in size. IMPRESSION: 1. Endotracheal tube tip at the thoracic inlet 3.2 cm from the carina. 2. Progressive volume loss in the left hemithorax. New vague upper lung zone opacity suggesting partial lobar collapse. Increasing patchy opacities throughout the mid and lower lung zone may be atelectasis, aspiration, or infection. Electronically Signed   By: Narda Rutherford M.D.   On: 07/24/2018 02:31   Dg Chest Port 1 View  Result Date: 07/23/2018 CLINICAL DATA:  Respiratory failure. EXAM: PORTABLE CHEST 1 VIEW COMPARISON:  Radiograph yesterday, lung apices from neck CTA yesterday. FINDINGS: Endotracheal tube tip remains just at the thoracic inlet. Enteric tube in place with tip below the diaphragm, side-port in the distal esophagus. Normal heart size and mediastinal contours. Mild bibasilar atelectasis. No pulmonary edema, pneumothorax, or pleural effusion. Surgical hardware in the cervical spine is partially included. IMPRESSION: 1. Endotracheal tube tip at the thoracic inlet. Enteric tube in place with tip below the diaphragm, side-port in the distal esophagus. Recommend advancement of cm for optimal placement. 2. Mild bibasilar atelectasis. Ground-glass opacities on neck CTA are not well demonstrated radiographically. Electronically Signed   By: Narda Rutherford M.D.   On: 07/23/2018 02:40   Dg Chest Port 1 View  Result Date: 07/22/2018 CLINICAL DATA:  Hypoxia and altered mental status EXAM: PORTABLE CHEST 1 VIEW COMPARISON:  None. FINDINGS: Endotracheal tube tip is 5.7 cm above the carina. Nasogastric tube tip is at the gastroesophageal junction. No pneumothorax. There is atelectatic change in the lung  bases as well as in the right mid lung. There is no frank edema or consolidation. Heart size and pulmonary vascularity are normal. No adenopathy. No bone lesions. IMPRESSION: Tube positions as described without pneumothorax. Note that nasogastric tube tip is at the gastroesophageal junction. Advise advancing nasogastric tube 8-10 cm. Areas of atelectatic change in the right mid lung and bilateral base regions. No consolidation. Heart size within normal limits. Electronically Signed   By: Bretta Bang III M.D.   On: 07/22/2018 13:59   Dg Abd Portable 1v  Result Date: 07/23/2018 CLINICAL DATA:  Gastric tube placement EXAM: PORTABLE ABDOMEN - 1 VIEW COMPARISON:  07/23/2018 FINDINGS: Gastric tube has been advanced and is now coiled within the body of the stomach. Normal bowel gas pattern. Moderate stool in the colon. No acute skeletal abnormality. IMPRESSION: NG tube has been advanced and is now coiled in the body the stomach. Nonobstructive bowel gas pattern. Electronically Signed   By: Marlan Palau M.D.   On: 07/23/2018 10:06   Dg Abd Portable 1v  Result Date: 07/22/2018 CLINICAL DATA:  Orogastric tube placement. Altered mental status and hypoxia EXAM: PORTABLE ABDOMEN - 1 VIEW COMPARISON:  None. FINDINGS: Orogastric tube tip is at the gastroesophageal junction with the side port in the distal esophagus. There  is moderate stool in the colon. There is no bowel dilatation or air-fluid level to suggest bowel obstruction. No free air. There is bibasilar lung atelectatic change. IMPRESSION: Orogastric tube tip at the gastroesophageal junction. Advise advancing orogastric tube 8-10 cm. No bowel obstruction or free air evident. Bibasilar lung atelectatic change. Electronically Signed   By: Bretta Bang III M.D.   On: 07/22/2018 14:00   Vas [place] Lower Extremity Venous (dvt)  Result Date: 07/24/2018  Lower Venous Study Indications: Stroke.  Performing Technologist: Blanch Media RVS  Examination Guidelines:  A complete evaluation includes B-mode imaging, spectral Doppler, color Doppler, and power Doppler as needed of all accessible portions of each vessel. Bilateral testing is considered an integral part of a complete examination. Limited examinations for reoccurring indications may be performed as noted.  Right Venous Findings: +---------+---------------+---------+-----------+----------+-------+          CompressibilityPhasicitySpontaneityPropertiesSummary +---------+---------------+---------+-----------+----------+-------+ CFV      Full           Yes      Yes                          +---------+---------------+---------+-----------+----------+-------+ SFJ      Full                                                 +---------+---------------+---------+-----------+----------+-------+ FV Prox  Full                                                 +---------+---------------+---------+-----------+----------+-------+ FV Mid   Full                                                 +---------+---------------+---------+-----------+----------+-------+ FV DistalFull                                                 +---------+---------------+---------+-----------+----------+-------+ PFV      Full                                                 +---------+---------------+---------+-----------+----------+-------+ POP      Full           Yes      Yes                          +---------+---------------+---------+-----------+----------+-------+ PTV      Full                                                 +---------+---------------+---------+-----------+----------+-------+ PERO     Full                                                 +---------+---------------+---------+-----------+----------+-------+  Left Venous Findings: +---------+---------------+---------+-----------+----------+-------+          CompressibilityPhasicitySpontaneityPropertiesSummary  +---------+---------------+---------+-----------+----------+-------+ CFV      Full           Yes      Yes                          +---------+---------------+---------+-----------+----------+-------+ SFJ      Full                                                 +---------+---------------+---------+-----------+----------+-------+ FV Prox  Full                                                 +---------+---------------+---------+-----------+----------+-------+ FV Mid   Full                                                 +---------+---------------+---------+-----------+----------+-------+ FV DistalFull                                                 +---------+---------------+---------+-----------+----------+-------+ PFV      Full                                                 +---------+---------------+---------+-----------+----------+-------+ POP      Full           Yes      Yes                          +---------+---------------+---------+-----------+----------+-------+ PTV      Full                                                 +---------+---------------+---------+-----------+----------+-------+ PERO     Full                                                 +---------+---------------+---------+-----------+----------+-------+    Summary: Right: There is no evidence of deep vein thrombosis in the lower extremity. No cystic structure found in the popliteal fossa. Left: There is no evidence of deep vein thrombosis in the lower extremity. No cystic structure found in the popliteal fossa.  *See table(s) above for measurements and observations.    Preliminary    2D Echocardiogram w/ bubble  PHYSICAL EXAM  1. The left ventricle has low normal systolic function, with an ejection fraction of 50-55%. The cavity size was normal. Left ventricular diastolic parameters were normal.  2. The right ventricle has normal systolc  function. The cavity was normal.  There is no increase in right ventricular wall thickness.  3. Negative bubble study for right to left shunting.  4. The aortic valve was not well visualized Aortic valve regurgitation is trivial by color flow Doppler.  PHYSICAL EXAM:  Temp:  [99 F (37.2 C)-99.7 F (37.6 C)] 99.7 F (37.6 C) (04/10 1200) Pulse Rate:  [58-89] 73 (04/10 1400) Resp:  [0-22] 17 (04/10 1400) BP: (109-178)/(69-96) 138/86 (04/10 1400) SpO2:  [92 %-100 %] 99 % (04/10 1521) FiO2 (%):  [40 %-60 %] 40 % (04/10 1521) Weight:  [73.8 kg] 73.8 kg (04/10 0423)  General - Well nourished, well developed, intubated on sedation.  Ophthalmologic - fundi not visualized due to noncooperation.  Cardiovascular - Regular rhythm, but tachycardia.  Neuro - intubated on sedation, eyes closed, not following commands. With forced eye opening, eyes in mid position, not blinking to visual threat, doll's eyes absent, not tracking, PERRL. Corneal reflex present on the left but not on the right, gag and cough positive. Breathing not over the vent.  Facial symmetry not able to test due to ET tube.  Tongue midline in mouth. On pain stimulation, LUE localized to pain, LLE withdraw, no movement of RUE and RLE, however, triple reflex present on the right LE. DTR 1+ and right babinski. Sensation, coordination and gait not tested.   ASSESSMENT/PLAN Mr. Corey Li is a 48 y.o. male with history of thyroid disease, sleep apnea, PTSD, neuropathy, hypertension, hyperlipidemia, diabetes with recent ACDF at the level of C5-C7 who lives in a hotel presenting on POD #6 with confusion that progressed to obtundation and rhonchorous respirations in the ED.   Stroke:  bilateral ischemic infarcts in various territories, largest in bilateral PCA territory, but also including left AchA, punctate bilateral MCA and bilateral ACA infarcts, embolic pattern due to cardio embolic source, concerning for endocarditis  Code Stroke CT head No acute stroke.     CT CS  corpectomy at C6. Anterior and posterior fusion C5-C7. Hardware in position. Postop prevertebral soft tissue swelling  CTA head & neck no ELVO. Diffuse irreg narrowing sm and med vessels. patcy ground glass opacities B lung apices. 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   2D Echo EF 50-55%. No source of embolus, negative bubble study  LE doppler neg DVT  Given bacteremia, may consider TEE to rule out endocarditis  LDL 72  HgbA1c 10.9  UDS neg  Lovenox 40 mg sq daily for VTE prophylaxis  No antithrombotic prior to admission, now on No antithrombotic as concerning for endocarditis.  Therapy recommendations:  pending   Disposition:  pending   Acute Respiratory Failure Aspiration PNA  Intubated on vent  On sedation with propofol  CTA neck b/l apices patchy ground glass opacities  Low probability for COVID  DKA w/ Diabetes type II Uncontrolled  HgbA1c 10.9, goal < 7.0  Hyperglycemia  Elevated Beta-hydroxybutyric acid  Now on levemir  Management per primary team  Bacteremia / sepsis  RVP neg  Blood culture positive for G+ cocci  Sensitivity pending  Sputum culture pending  On cefepime and vanco  Concerning for endocarditis, may need TEE  Hypertension  Stable . On amlodipine 5mg  . Long-term BP goal normotensive  Hyperlipidemia  Home meds:  lipitor 80  LDL 72, goal < 70  Resume lipitor 80  Continue statin at discharge  Other Stroke Risk Factors  Cigarette smoker - cessation education will be provided  Hx ETOH use  Obstructive sleep apnea  Other Active Problems  recent cervical decompression and fusion at the level of C5-C7 w/ L shoulder and arm pain (Corey Li)  AKI Cre 3.00->2.67->1.95->1.74->1.15   Hospital day # 2  This patient is  critically ill due to multifocal infarcts, bacteremia, sepsis, respiratory failure, DKA and at significant risk of neurological worsening, death form recurrent stroke, hemorrhagic conversion, septic shock, seizure. This patient's care requires constant monitoring of vital signs, hemodynamics, respiratory and cardiac monitoring, review of multiple databases, neurological assessment, discussion with family, other specialists and medical decision making of high complexity. I spent 40 minutes of neurocritical care time in the care of this patient.  I also discussed with Dr. Marchelle Gearing.  Marvel Plan, MD PhD Stroke Neurology 07/24/2018 3:57 PM    To contact Stroke Continuity provider, please refer to [email or web address]. After hours, contact General Neurology

## 2018-07-25 ENCOUNTER — Inpatient Hospital Stay (HOSPITAL_COMMUNITY): Payer: Medicaid Other

## 2018-07-25 LAB — CULTURE, RESPIRATORY

## 2018-07-25 LAB — RENAL FUNCTION PANEL
Albumin: 2.7 g/dL — ABNORMAL LOW (ref 3.5–5.0)
Anion gap: 13 (ref 5–15)
BUN: 21 mg/dL — ABNORMAL HIGH (ref 6–20)
CO2: 24 mmol/L (ref 22–32)
Calcium: 8.3 mg/dL — ABNORMAL LOW (ref 8.9–10.3)
Chloride: 103 mmol/L (ref 98–111)
Creatinine, Ser: 0.85 mg/dL (ref 0.61–1.24)
GFR calc Af Amer: >60 mL/min (ref >60)
GFR calc non Af Amer: >60 mL/min (ref >60)
Glucose, Bld: 211 mg/dL — ABNORMAL HIGH (ref 70–99)
Phosphorus: 1.6 mg/dL — ABNORMAL LOW (ref 2.5–4.6)
Potassium: 3.6 mmol/L (ref 3.5–5.1)
Sodium: 140 mmol/L (ref 135–145)

## 2018-07-25 LAB — GLUCOSE, CAPILLARY
Glucose-Capillary: 108 mg/dL — ABNORMAL HIGH (ref 70–99)
Glucose-Capillary: 113 mg/dL — ABNORMAL HIGH (ref 70–99)
Glucose-Capillary: 115 mg/dL — ABNORMAL HIGH (ref 70–99)
Glucose-Capillary: 194 mg/dL — ABNORMAL HIGH (ref 70–99)
Glucose-Capillary: 207 mg/dL — ABNORMAL HIGH (ref 70–99)
Glucose-Capillary: 207 mg/dL — ABNORMAL HIGH (ref 70–99)
Glucose-Capillary: 215 mg/dL — ABNORMAL HIGH (ref 70–99)

## 2018-07-25 LAB — CBC
HCT: 27.2 % — ABNORMAL LOW (ref 39.0–52.0)
Hemoglobin: 9.3 g/dL — ABNORMAL LOW (ref 13.0–17.0)
MCH: 31.3 pg (ref 26.0–34.0)
MCHC: 34.2 g/dL (ref 30.0–36.0)
MCV: 91.6 fL (ref 80.0–100.0)
Platelets: 290 10*3/uL (ref 150–400)
RBC: 2.97 MIL/uL — ABNORMAL LOW (ref 4.22–5.81)
RDW: 14.9 % (ref 11.5–15.5)
WBC: 14.2 10*3/uL — ABNORMAL HIGH (ref 4.0–10.5)
nRBC: 0 % (ref 0.0–0.2)

## 2018-07-25 LAB — CULTURE, RESPIRATORY W GRAM STAIN: Gram Stain: NONE SEEN

## 2018-07-25 LAB — CULTURE, BLOOD (ROUTINE X 2): Special Requests: ADEQUATE

## 2018-07-25 MED ORDER — POTASSIUM PHOSPHATES 15 MMOLE/5ML IV SOLN
24.0000 mmol | Freq: Once | INTRAVENOUS | Status: AC
  Administered 2018-07-25: 24 mmol via INTRAVENOUS
  Filled 2018-07-25: qty 8

## 2018-07-25 MED ORDER — ASPIRIN EC 325 MG PO TBEC
325.0000 mg | DELAYED_RELEASE_TABLET | Freq: Every day | ORAL | Status: DC
  Administered 2018-07-27 – 2018-08-02 (×6): 325 mg via ORAL
  Filled 2018-07-25 (×8): qty 1

## 2018-07-25 MED ORDER — POTASSIUM CHLORIDE 20 MEQ/15ML (10%) PO SOLN
40.0000 meq | Freq: Once | ORAL | Status: AC
  Administered 2018-07-25: 40 meq
  Filled 2018-07-25: qty 30

## 2018-07-25 MED ORDER — IPRATROPIUM-ALBUTEROL 0.5-2.5 (3) MG/3ML IN SOLN
3.0000 mL | Freq: Two times a day (BID) | RESPIRATORY_TRACT | Status: DC
  Administered 2018-07-25 – 2018-07-31 (×11): 3 mL via RESPIRATORY_TRACT
  Filled 2018-07-25 (×14): qty 3

## 2018-07-25 MED ORDER — SODIUM CHLORIDE 0.9 % IV SOLN
2.0000 g | INTRAVENOUS | Status: DC
  Administered 2018-07-25 – 2018-07-26 (×2): 2 g via INTRAVENOUS
  Filled 2018-07-25 (×3): qty 20

## 2018-07-25 MED ORDER — ASPIRIN 300 MG RE SUPP
300.0000 mg | Freq: Every day | RECTAL | Status: DC
  Administered 2018-07-25 – 2018-07-26 (×2): 300 mg via RECTAL
  Filled 2018-07-25 (×3): qty 1

## 2018-07-25 MED ORDER — AMLODIPINE BESYLATE 10 MG PO TABS
10.0000 mg | ORAL_TABLET | Freq: Every day | ORAL | Status: DC
  Administered 2018-07-27 – 2018-08-01 (×5): 10 mg via ORAL
  Filled 2018-07-25 (×5): qty 1

## 2018-07-25 NOTE — Progress Notes (Signed)
Doing well post extubation  Plan D/w Dr Thedore Mins triad - move to neuro tele Ccm off from 07/26/18    SIGNATURE    Dr. Kalman Shan, M.D., F.C.C.P,  Pulmonary and Critical Care Medicine Staff Physician, [facility] Director - Interstitial Lung Disease  Program  Pulmonary Fibrosis [facility] at [facility] [place], [place], [address]  Pager: [phone], If no answer or between  15:00h - 7:00h: call [phone] Telephone: [phone]  7:11 PM 07/25/2018

## 2018-07-25 NOTE — Progress Notes (Signed)
NAMEDemyan Li, MRN:  [id number], DOB:  1970-11-14, LOS: 3 ADMISSION DATE:  07/22/2018, CONSULTATION DATE:  07/22/2018 REFERRING MD:  Dr. Silverio Lay, CHIEF COMPLAINT:  AMS  Brief History   61 yoM DMT2, s/p recent A/P-CDF C5- C7 by Ortho on 4/1 and 4/2 presenting from hotel room with AMS and hypoxic.  Some right sided weakness in ER, code stroke called, CTH neg.  CT cervical with stable postop changes.  Afebrile. Glucose 958.  Intubated for airway protection, hypotensive post intubation.  PCCM to admit for DKA.   History of present illness   Tobacco abuse, DM type 2, HTN, HLD, hypothyroid, sleep apnea, GERD, sleep, anxiety, depression   Consults:  Neurology   Procedures:  4/8 ETT >>  Significant Diagnostic Tests:  4/1/ 4/2 A/P cervical decompression and fusion by Dr. Yevette Edwards 07/22/2018 - admit and itnubated  4/8 CTH code stroke >> neg  4/8 CT cervical  >>Image quality degraded by moderate motion.  Corpectomy at C6. Anterior and posterior fusion from C5 through C7.  Hardware in good position and unchanged from recent study.  Postop prevertebral soft tissue swelling related to recent surgery and unchanged from the prior study.  4/8 CXR >>Tube positions as described without pneumothorax. Note that nasogastric tube tip is at the gastroesophageal junction. Advise advancing nasogastric tube 8-10 cm. Areas of atelectatic change in the right mid lung and bilateral base regions. No consolidation. Heart size within normal limits.  4/8 CTA head/ neck >>  1. No emergent large vessel occlusion.  2. Widely patent cervical carotid and vertebral arteries. 3. Diffuse irregular narrowing of the small to medium-sized intracranial arteries. While this could be partly artifactual, a diffuse non-atherosclerotic process such as vasculitis is a concern. 4. Patchy ground-glass opacities in both lung apices, nonspecific. Atypical infection (including viral pneumonia) and noninfectious etiologies are possible. 5.  Postoperative changes from recent cervical spine fusion as previously described. Postoperative infection is not excluded.  4/9 MRI brain >> 1. Multifocal acute ischemia throughout multiple vascular territories in a pattern most consistent with emboli from a central cardiac or aortic source. The largest areas of ischemia are within the posterior cerebral artery territories. 2. No acute hemorrhage, herniation or hydrocephalus. 3. 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 4. No proximal intracranial arterial occlusion.  4/10 TTE >>   Thick secretions overnight.  Had ETT displacement overnight requiring intubation and bronch for thick secretions.   MRI yest noted for multiple infarcts - most notably bilateral PCA territories and left internal capsule- suspected to be cardioembolic vs vasculitis   Afebrile Net +1.7L  QTc 0.540  Micro Data:  4/8 BC x 2 >>  1/2 coag neg- stap epi on BCID likely contaminant  4/8 UC >> neg 4/8 RVP >> negative 4/9 trach asp >> 4/9 MRSA PCR >> neg  Antimicrobials:  4/8 vanc >> 4/9 4/8 cefepime >>  Interim history/subjective:   4.11 - having cuff leak issues but weaning. Followed some commands while on dipriva. Echo yesterday with ef 55%  And negative bubble study/ Not on pressors. Per RN - > patient came from a hotel room but has ankle bracelet on but no police at bedside   Objective   Blood pressure (!) 146/91, pulse 77, temperature 98.9 F (37.2 C), temperature source Axillary, resp. rate 14, height  (1.753 m), weight 75.2 kg, SpO2 96 %.    Vent Mode: PSV;CPAP FiO2 (%):  [40 %] 40 % Set Rate:  [17 bmp] 17 bmp Vt  Set:  [560 mL] 560 mL PEEP:  [5 cmH20] 5 cmH20 Pressure Support:  [10 cmH20] 10 cmH20 Plateau Pressure:  [14 cmH20-19 cmH20] 19 cmH20   Intake/Output Summary (Last 24 hours) at 07/25/2018 1104 Last data filed at 07/25/2018 0600 Gross per 24 hour  Intake 868.96 ml  Output 1185 ml  Net -316.04 ml   Filed  Weights   07/23/18 0500 07/24/18 0423 07/25/18 0500  Weight: 75.7 kg 73.8 kg 75.2 kg  General Appearance:  Looks criticall ill Head:  Normocephalic, without obvious abnormality, atraumatic Eyes:  PERRL - yes, conjunctiva/corneas - clear     Ears:  Normal external ear canals, both ears Nose:  G tube - no Throat:  ETT TUBE - yes , OG tube - yes Neck:  Supple,  No enlargement/tenderness/nodules Lungs: Clear to auscultation bilaterally, Ventilator   Synchrony - yes Heart:  S1 and S2 normal, no murmur, CVP - no.  Pressors - none Abdomen:  Soft, no masses, no organomegaly Genitalia / Rectal:  Not done Extremities:  Extremities- intact Skin:  ntact in exposed areas . Sacral area - not examined Neurologic:  Sedation - diprivan -> RASS - -3 .       LABS    PULMONARY Recent Labs  Lab 07/22/18 1246 07/22/18 1621 07/23/18 0946 07/23/18 1358  PHART  --  7.178* 7.611* 7.515*  PCO2ART  --  26.3* 27.1* 38.6  PO2ART  --  361.0* 105.0 380.0*  HCO3 6.1* 9.8* 27.2 31.1*  TCO2 7* 11* 28 32  O2SAT 81.0 100.0 99.0 100.0    CBC Recent Labs  Lab 07/23/18 0448  07/23/18 1358 07/24/18 0014 07/25/18 0350  HGB 9.1*   < > 14.6 9.3* 9.3*  HCT 25.6*   < > 43.0 25.7* 27.2*  WBC 18.2*  --   --  21.6* 14.2*  PLT 348  --   --  295 290   < > = values in this interval not displayed.    COAGULATION Recent Labs  Lab 07/22/18 1234  INR 1.2    CARDIAC   Recent Labs  Lab 07/22/18 1627  TROPONINI <0.03   No results for input(s): PROBNP in the last 168 hours.   CHEMISTRY Recent Labs  Lab 07/22/18 1627  07/22/18 2344 07/23/18 0448 07/23/18 0834 07/23/18 0946 07/23/18 1358 07/24/18 0014 07/25/18 0350  NA  --    < > 135 140 143 144 143 140 140  K  --    < > 3.5 3.7 3.5 3.3* 3.7 3.6 3.6  CL  --    < > 101 103 104  --   --  103 103  CO2  --    < > 20* 24 26  --   --  25 24  GLUCOSE  --    < > 478* 224* 101*  --   --  166* 211*  BUN  --    < > 59* 53* 47*  --   --  24* 21*    CREATININE  --    < > 2.30* 1.95* 1.74*  --   --  1.15 0.85  CALCIUM  --    < > 8.5* 9.0 8.9  --   --  8.4* 8.3*  MG 2.6*  --   --  2.4  --   --   --  2.2  --   PHOS 5.8*  --   --  <1.0* <1.0*  --   --  3.1   3.2 1.6*   < > =  values in this interval not displayed.   Estimated Creatinine Clearance: 105.1 mL/min (by C-G formula based on SCr of 0.85 mg/dL).   LIVER Recent Labs  Lab 07/22/18 1234 07/24/18 0014 07/25/18 0350  AST 40  --   --   ALT 35  --   --   ALKPHOS 121  --   --   BILITOT 2.1*  --   --   PROT 8.1  --   --   ALBUMIN 4.1 3.1* 2.7*  INR 1.2  --   --      INFECTIOUS Recent Labs  Lab 07/22/18 1331 07/22/18 1627  LATICACIDVEN 2.9* 2.2*  PROCALCITON  --  0.77     ENDOCRINE CBG (last 3)  Recent Labs    07/25/18 0009 07/25/18 0342 07/25/18 0821  GLUCAP 207* 194* 207*         IMAGING x48h  - image(s) personally visualized  -   highlighted in bold Dg Abd 1 View  Result Date: 07/24/2018 CLINICAL DATA:  OG tube placement. EXAM: ABDOMEN - 1 VIEW COMPARISON:  Radiograph yesterday. FINDINGS: Tip and side port of the enteric tube below the diaphragm in the stomach. Nonobstructive bowel gas pattern in the upper abdomen. IMPRESSION: Tip and side port of the enteric tube below the diaphragm in the stomach. Electronically Signed   By: Narda RutherfordMelanie  Sanford M.D.   On: 07/24/2018 02:32   Mr Maxine GlennMra Head Wo Contrast  Result Date: 07/23/2018 CLINICAL DATA:  Stroke follow-up.  Right-sided paralysis. EXAM: MRI HEAD WITHOUT AND WITH CONTRAST MRA HEAD WITHOUT CONTRAST TECHNIQUE: Multiplanar, multiecho pulse sequences of the brain and surrounding structures were obtained without and with intravenous contrast. Angiographic images of the head were obtained using MRA technique without contrast. CONTRAST:  8 mL Gadavist COMPARISON:  Brain MRI 06/29/2018 CTA head neck 07/22/2018 FINDINGS: MRI HEAD FINDINGS BRAIN: Multifocal bilateral acute ischemia scattered throughout both hemispheres  and multiple vascular territories, including the right cerebellum. The largest areas of ischemia are in the posterior cerebral artery territories. 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 There is moderate edema within the affected areas. There is mild leptomeningeal contrast enhancement within both PCA territories and within the left basal ganglia. The midline structures are normal. The white matter signal is normal for the patient's age. The CSF spaces are normal for age, with no hydrocephalus. Susceptibility-sensitive sequences show no chronic microhemorrhage or superficial siderosis. SKULL AND UPPER CERVICAL SPINE: The visualized skull base, calvarium, upper cervical spine and extracranial soft tissues are normal. SINUSES/ORBITS: No fluid levels or advanced mucosal thickening. No mastoid or middle ear effusion. The orbits are normal. MRA HEAD FINDINGS POSTERIOR CIRCULATION: --Basilar artery: Normal. --Posterior cerebral arteries: Patent proximally. Multifocal moderate-to-severe stenosis of the distal P2 segments. --Superior cerebellar arteries: Normal. --Inferior cerebellar arteries: Normal anterior and posterior inferior cerebellar arteries. ANTERIOR CIRCULATION: --Intracranial internal carotid arteries: Normal. --Anterior cerebral arteries: Normal. Both A1 segments are present. Patent anterior communicating artery. --Middle cerebral arteries: Normal. --Posterior communicating arteries: Absent or diminutive IMPRESSION: 1. Multifocal acute ischemia throughout multiple vascular territories in a pattern most consistent with emboli from a central cardiac or aortic source. The largest areas of ischemia are within the posterior cerebral artery territories. 2. No acute hemorrhage, herniation or hydrocephalus. 3. 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 4. No proximal  intracranial arterial occlusion. Electronically Signed   By: Chrisandra NettersKevin  Herman M.D.  On: 07/23/2018 16:08   Mr Laqueta Jean JY Contrast  Result Date: 07/23/2018 CLINICAL DATA:  Stroke follow-up.  Right-sided paralysis. EXAM: MRI HEAD WITHOUT AND WITH CONTRAST MRA HEAD WITHOUT CONTRAST TECHNIQUE: Multiplanar, multiecho pulse sequences of the brain and surrounding structures were obtained without and with intravenous contrast. Angiographic images of the head were obtained using MRA technique without contrast. CONTRAST:  8 mL Gadavist COMPARISON:  Brain MRI 06/29/2018 CTA head neck 07/22/2018 FINDINGS: MRI HEAD FINDINGS BRAIN: Multifocal bilateral acute ischemia scattered throughout both hemispheres and multiple vascular territories, including the right cerebellum. The largest areas of ischemia are in the posterior cerebral artery territories. 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 There is moderate edema within the affected areas. There is mild leptomeningeal contrast enhancement within both PCA territories and within the left basal ganglia. The midline structures are normal. The white matter signal is normal for the patient's age. The CSF spaces are normal for age, with no hydrocephalus. Susceptibility-sensitive sequences show no chronic microhemorrhage or superficial siderosis. SKULL AND UPPER CERVICAL SPINE: The visualized skull base, calvarium, upper cervical spine and extracranial soft tissues are normal. SINUSES/ORBITS: No fluid levels or advanced mucosal thickening. No mastoid or middle ear effusion. The orbits are normal. MRA HEAD FINDINGS POSTERIOR CIRCULATION: --Basilar artery: Normal. --Posterior cerebral arteries: Patent proximally. Multifocal moderate-to-severe stenosis of the distal P2 segments. --Superior cerebellar arteries: Normal. --Inferior cerebellar arteries: Normal anterior  and posterior inferior cerebellar arteries. ANTERIOR CIRCULATION: --Intracranial internal carotid arteries: Normal. --Anterior cerebral arteries: Normal. Both A1 segments are present. Patent anterior communicating artery. --Middle cerebral arteries: Normal. --Posterior communicating arteries: Absent or diminutive IMPRESSION: 1. Multifocal acute ischemia throughout multiple vascular territories in a pattern most consistent with emboli from a central cardiac or aortic source. The largest areas of ischemia are within the posterior cerebral artery territories. 2. No acute hemorrhage, herniation or hydrocephalus. 3. 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 4. No proximal intracranial arterial occlusion. Electronically Signed   By: Deatra Robinson M.D.   On: 07/23/2018 16:08   Dg Chest Port 1 View  Result Date: 07/25/2018 CLINICAL DATA:  Ventilator dependent respiratory failure. Follow-up LEFT LOWER LOBE atelectasis and/or pneumonia. EXAM: PORTABLE CHEST 1 VIEW COMPARISON:  07/24/2018 and earlier. FINDINGS: Endotracheal tube tip in satisfactory position approximately 3 cm above carina. Gastric tube courses below the diaphragm into the stomach. Heart size normal for AP portable technique. Mild airspace consolidation in the LEFT LOWER LOBE, unchanged. New linear atelectasis in the LEFT UPPER LOBE and the BILATERAL lung bases. No confluent airspace consolidation elsewhere in either lung. IMPRESSION: 1. Support apparatus satisfactory. 2. Stable mild LEFT LOWER LOBE pneumonia. 3. New linear atelectasis in the LEFT UPPER LOBE and BILATERAL lung bases. Electronically Signed   By: Hulan Saas M.D.   On: 07/25/2018 07:54   Dg Chest Port 1 View  Result Date: 07/24/2018 CLINICAL DATA:  Intubation. EXAM: PORTABLE CHEST 1 VIEW COMPARISON:  Radiograph yesterday. FINDINGS: Endotracheal tube tip at the thoracic inlet 3.2 cm from the carina. Progressive volume loss in the left hemithorax with increasing  basilar and mid lung patchy opacity. Progressive vague opacity in the left upper lung zone, suggesting partial lobar collapse. Right lung base atelectasis. Heart is normal in size. IMPRESSION: 1. Endotracheal tube tip at the thoracic inlet 3.2 cm from the carina. 2. Progressive volume loss in the left hemithorax. New vague upper  lung zone opacity suggesting partial lobar collapse. Increasing patchy opacities throughout the mid and lower lung zone may be atelectasis, aspiration, or infection. Electronically Signed   By: Narda Rutherford M.D.   On: 07/24/2018 02:31   Vas [place] Lower Extremity Venous (dvt)  Result Date: 07/24/2018  Lower Venous Study Indications: Stroke.  Performing Technologist: Blanch Media RVS  Examination Guidelines: A complete evaluation includes B-mode imaging, spectral Doppler, color Doppler, and power Doppler as needed of all accessible portions of each vessel. Bilateral testing is considered an integral part of a complete examination. Limited examinations for reoccurring indications may be performed as noted.  Right Venous Findings: +---------+---------------+---------+-----------+----------+-------+            Compressibility Phasicity Spontaneity Properties Summary  +---------+---------------+---------+-----------+----------+-------+  CFV       Full            Yes       Yes                             +---------+---------------+---------+-----------+----------+-------+  SFJ       Full                                                      +---------+---------------+---------+-----------+----------+-------+  FV Prox   Full                                                      +---------+---------------+---------+-----------+----------+-------+  FV Mid    Full                                                      +---------+---------------+---------+-----------+----------+-------+  FV Distal Full                                                       +---------+---------------+---------+-----------+----------+-------+  PFV       Full                                                      +---------+---------------+---------+-----------+----------+-------+  POP       Full            Yes       Yes                             +---------+---------------+---------+-----------+----------+-------+  PTV       Full                                                      +---------+---------------+---------+-----------+----------+-------+  PERO      Full                                                      +---------+---------------+---------+-----------+----------+-------+  Left Venous Findings: +---------+---------------+---------+-----------+----------+-------+            Compressibility Phasicity Spontaneity Properties Summary  +---------+---------------+---------+-----------+----------+-------+  CFV       Full            Yes       Yes                             +---------+---------------+---------+-----------+----------+-------+  SFJ       Full                                                      +---------+---------------+---------+-----------+----------+-------+  FV Prox   Full                                                      +---------+---------------+---------+-----------+----------+-------+  FV Mid    Full                                                      +---------+---------------+---------+-----------+----------+-------+  FV Distal Full                                                      +---------+---------------+---------+-----------+----------+-------+  PFV       Full                                                      +---------+---------------+---------+-----------+----------+-------+  POP       Full            Yes       Yes                             +---------+---------------+---------+-----------+----------+-------+  PTV       Full                                                       +---------+---------------+---------+-----------+----------+-------+  PERO      Full                                                      +---------+---------------+---------+-----------+----------+-------+  Summary: Right: There is no evidence of deep vein thrombosis in the lower extremity. No cystic structure found in the popliteal fossa. Left: There is no evidence of deep vein thrombosis in the lower extremity. No cystic structure found in the popliteal fossa.  *See table(s) above for measurements and observations. Electronically signed by Fabienne Bruns MD on 07/24/2018 at 6:57:15 PM.    Final      Resolved Hospital Problem list    Assessment & Plan:   Acute hypoxic respiratory failure R/o aspiration pna  Possible mucous plugging 4/9 Tobacco abuse  - CXR post intubation, pre bronch this morning with increasing left lobar pneumonia, ett stable, OGT below diaphragm    07/25/2018 - > might  meet criteria for /Extubation in setting of Acute Respiratory Failure . Having cuff leak + but able to inflate and hold for several hours   P SBT off diprivan And if well extubate Otherwise, PRVC   Stroke- multiple infarcts in bilateral PCA territories and left internal capsule - right sided deficits - ddx cardioembolic vs vasculitis  - negative bubble study 07/24/2018  P:  Neurology following, appreciate assistance.   Ongoing neuro exams  Stroke workup pending- TTE, lipid panel, HgbA1c Allow permissive HTN up to 180/110 Continue statin  DKA- resolved  Hx DMT2 P:  CBG q 4 SSI resistant Levemir 18 units BID Will need to add TF coverage and possible increase levemir if TF started if not extubated   AKI- resolved  AGMA- resolved Hyperkalemia- resolved   07/25/2018 - hypokalemia nd hypophosphatemia  P:  Replete K phos  Suspected aspiration pna   P:  Follow cultures Trend WBC/ fever curve   S/p A/ P CDF C5-C7, on 4/1/ 4/2 - CT cervical shows no fluid collections, stable postop prevertebral soft tissue swelling P:  Dr. Yevette Edwards following, would likely need rehab/ SNF on discharge  Hypothyroidism P:  Daily synthroid 175 mcg  Hx HTN, HLD Abnormal EKG Prolonged QTc  P:  Continue home norvasc  D/c IVF Monitor daily QTc  Allow some permissive HTN as above Hold home lisinopril and lasix  continue home lipitor   Anxiety/ depression P:  Hold home prozac, valium  GERD P:  PPI  Best practice:  Diet: NPO, start TF if not extubated  Pain/Anxiety/Delirium protocol (if indicated): prn fentanyl/ versed if needed  VAP protocol (if indicated): yes DVT prophylaxis: lovenox GI prophylaxis: PPI per tube Glucose control: SSI  Mobility: BR Code Status: Full   Family Communication: only next of contact listed is a friend Frederik Schmidt, who lives in [place]. She knows that he has a brother but does not know any further information on him, even his name, parents are both deceased.  Will contact CSW to try and establish NOK.   Disposition: ICU    ATTESTATION & SIGNATURE   The patient [place]  [place] is critically ill with multiple organ systems failure and requires high complexity decision making for assessment and support, frequent evaluation and titration of therapies, application of advanced monitoring technologies and extensive interpretation of multiple databases.   Critical Care Time devoted to patient care services described in this note is  30  Minutes. This time reflects time of care of this signee Dr Kalman Shan. This critical care time does not reflect procedure time, or teaching time or supervisory time of PA/NP/Med student/Med Resident etc but could involve care discussion time     Dr. Kalman Shan, M.D., [facility].C.P Pulmonary and Critical Care Medicine Staff Physician [facility] [place] Pulmonary and Critical Care Pager: [phone], If no answer or between  15:00h - 7:00h: call [phone]  07/25/2018 11:04 AM

## 2018-07-25 NOTE — Progress Notes (Signed)
   Cuff leak +_  Off diprivan  - still some drowsly but good cough, gag and follows commands intermittently and strong tone on left side  Plan   - extubate     SIGNATURE    Dr. Kalman Shan, M.D., F.C.C.P,  Pulmonary and Critical Care Medicine Staff Physician, [facility] Center Director - Interstitial Lung Disease  Program  Pulmonary Fibrosis [facility] at [facility] [place], [place], [address]  Pager: [phone], If no answer or between  15:00h - 7:00h: call [phone] Telephone: [phone]  12:35 PM 07/25/2018

## 2018-07-25 NOTE — Progress Notes (Signed)
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 PERRL, not cooperative with lateral gaze. Still has right hemiparesis. Blood culture now indicated contamination.   Vitals:   07/25/18 0600 07/25/18 0751 07/25/18 1003 07/25/18 1112  BP: (!) 149/92 (!) 146/88 (!) 146/91 (!) 162/98  Pulse: 65 77  77  Resp: 17 14  15   Temp:      TempSrc:      SpO2: 100% 96%  100%  Weight:      Height:        CBC:  Recent Labs  Lab 07/22/18 1234  07/24/18 0014 07/25/18 0350  WBC 18.2*   < > 21.6* 14.2*  NEUTROABS 15.3*  --   --   --   HGB 10.7*   < > 9.3* 9.3*  HCT 35.8*   < > 25.7* 27.2*  MCV 104.1*   < > 89.2 91.6  PLT 482*   < > 295 290   < > = values in this interval not displayed.    Basic Metabolic Panel:  Recent Labs  Lab 07/23/18 0448  07/24/18 0014 07/25/18 0350  NA 140   < > 140 140  K 3.7   < > 3.6 3.6  CL 103   < > 103 103  CO2 24   < > 25 24  GLUCOSE 224*   < > 166* 211*  BUN 53*   < > 24* 21*  CREATININE 1.95*   < > 1.15 0.85  CALCIUM 9.0   < > 8.4* 8.3*  MG 2.4  --  2.2  --   PHOS <1.0*   < > 3.1  3.2 1.6*   < > = values in this interval not displayed.   Lipid Panel:     Component Value Date/Time   CHOL 148 07/24/2018 0014   TRIG 195 (H) 07/24/2018 0014   HDL 37 (L) 07/24/2018 0014   CHOLHDL 4.0 07/24/2018 0014   VLDL 39 07/24/2018 0014   LDLCALC 72 07/24/2018 0014   HgbA1c:  Lab Results  Component Value Date   HGBA1C 10.9 (H) 07/13/2018   Urine Drug Screen:     Component Value Date/Time   LABOPIA NONE DETECTED 07/22/2018 1234   COCAINSCRNUR NONE DETECTED 07/22/2018 1234   LABBENZ POSITIVE (A) 07/22/2018 1234   AMPHETMU NONE DETECTED 07/22/2018 1234   THCU NONE DETECTED 07/22/2018 1234   LABBARB NONE DETECTED 07/22/2018 1234    Alcohol Level     Component Value Date/Time   ETH <10 07/22/2018 1627    IMAGING Dg Abd 1 View  Result Date: 07/24/2018 CLINICAL DATA:  OG tube placement.  EXAM: ABDOMEN - 1 VIEW COMPARISON:  Radiograph yesterday. FINDINGS: Tip and side port of the enteric tube below the diaphragm in the stomach. Nonobstructive bowel gas pattern in the upper abdomen. IMPRESSION: Tip and side port of the enteric tube below the diaphragm in the stomach. Electronically Signed   By: Narda RutherfordMelanie  Sanford M.D.   On: 07/24/2018 02:32   Mr Maxine GlennMra Head Wo Contrast  Result Date: 07/23/2018 CLINICAL DATA:  Stroke follow-up.  Right-sided paralysis. EXAM: MRI HEAD WITHOUT AND WITH CONTRAST MRA HEAD WITHOUT CONTRAST TECHNIQUE: Multiplanar, multiecho pulse sequences of the brain and surrounding structures were obtained without and with intravenous contrast. Angiographic images of the head were obtained using MRA technique without contrast. CONTRAST:  8 mL Gadavist COMPARISON:  Brain MRI 06/29/2018 CTA head neck 07/22/2018 FINDINGS: MRI HEAD FINDINGS BRAIN: Multifocal  bilateral acute ischemia scattered throughout both hemispheres and multiple vascular territories, including the right cerebellum. The largest areas of ischemia are in the posterior cerebral artery territories. 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 There is moderate edema within the affected areas. There is mild leptomeningeal contrast enhancement within both PCA territories and within the left basal ganglia. The midline structures are normal. The white matter signal is normal for the patient's age. The CSF spaces are normal for age, with no hydrocephalus. Susceptibility-sensitive sequences show no chronic microhemorrhage or superficial siderosis. SKULL AND UPPER CERVICAL SPINE: The visualized skull base, calvarium, upper cervical spine and extracranial soft tissues are normal. SINUSES/ORBITS: No fluid levels or advanced mucosal thickening. No mastoid or middle ear effusion. The orbits are normal. MRA HEAD FINDINGS  POSTERIOR CIRCULATION: --Basilar artery: Normal. --Posterior cerebral arteries: Patent proximally. Multifocal moderate-to-severe stenosis of the distal P2 segments. --Superior cerebellar arteries: Normal. --Inferior cerebellar arteries: Normal anterior and posterior inferior cerebellar arteries. ANTERIOR CIRCULATION: --Intracranial internal carotid arteries: Normal. --Anterior cerebral arteries: Normal. Both A1 segments are present. Patent anterior communicating artery. --Middle cerebral arteries: Normal. --Posterior communicating arteries: Absent or diminutive IMPRESSION: 1. Multifocal acute ischemia throughout multiple vascular territories in a pattern most consistent with emboli from a central cardiac or aortic source. The largest areas of ischemia are within the posterior cerebral artery territories. 2. No acute hemorrhage, herniation or hydrocephalus. 3. 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 4. No proximal intracranial arterial occlusion. Electronically Signed   By: Deatra Robinson M.D.   On: 07/23/2018 16:08   Mr Laqueta Jean EA Contrast  Result Date: 07/23/2018 CLINICAL DATA:  Stroke follow-up.  Right-sided paralysis. EXAM: MRI HEAD WITHOUT AND WITH CONTRAST MRA HEAD WITHOUT CONTRAST TECHNIQUE: Multiplanar, multiecho pulse sequences of the brain and surrounding structures were obtained without and with intravenous contrast. Angiographic images of the head were obtained using MRA technique without contrast. CONTRAST:  8 mL Gadavist COMPARISON:  Brain MRI 06/29/2018 CTA head neck 07/22/2018 FINDINGS: MRI HEAD FINDINGS BRAIN: Multifocal bilateral acute ischemia scattered throughout both hemispheres and multiple vascular territories, including the right cerebellum. The largest areas of ischemia are in the posterior cerebral artery territories. Infarct along the dorsal aspect of the left basal ganglia, in close proximity to the posterior limb of the internal capsule could account for the  right-sided weakness. There is moderate edema within the affected areas. There is mild leptomeningeal contrast enhancement within both PCA territories and within the left basal ganglia. The midline structures are normal. The white matter signal is normal for the patient's age. The CSF spaces are normal for age, with no hydrocephalus. Susceptibility-sensitive sequences show no chronic microhemorrhage or superficial siderosis. SKULL AND UPPER CERVICAL SPINE: The visualized skull base, calvarium, upper cervical spine and extracranial soft tissues are normal. SINUSES/ORBITS: No fluid levels or advanced mucosal thickening. No mastoid or middle ear effusion. The orbits are normal. MRA HEAD FINDINGS POSTERIOR CIRCULATION: --Basilar artery: Normal. --Posterior cerebral arteries: Patent proximally. Multifocal moderate-to-severe stenosis of the distal P2 segments. --Superior cerebellar arteries: Normal. --Inferior cerebellar arteries: Normal anterior and posterior inferior cerebellar arteries. ANTERIOR CIRCULATION: --Intracranial internal carotid arteries: Normal. --Anterior cerebral arteries: Normal. Both A1 segments are present. Patent anterior communicating artery. --Middle cerebral arteries: Normal. --Posterior communicating arteries: Absent or diminutive IMPRESSION: 1. Multifocal acute ischemia throughout multiple vascular territories in a pattern most consistent with emboli from a central cardiac  or aortic source. The largest areas of ischemia are within the posterior cerebral artery territories. 2. No acute hemorrhage, herniation or hydrocephalus. 3. 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 4. No proximal intracranial arterial occlusion. Electronically Signed   By: Deatra Robinson M.D.   On: 07/23/2018 16:08   Dg Chest Port 1 View  Result Date: 07/25/2018 CLINICAL DATA:  Ventilator dependent respiratory failure. Follow-up LEFT LOWER LOBE atelectasis and/or pneumonia. EXAM: PORTABLE CHEST 1 VIEW  COMPARISON:  07/24/2018 and earlier. FINDINGS: Endotracheal tube tip in satisfactory position approximately 3 cm above carina. Gastric tube courses below the diaphragm into the stomach. Heart size normal for AP portable technique. Mild airspace consolidation in the LEFT LOWER LOBE, unchanged. New linear atelectasis in the LEFT UPPER LOBE and the BILATERAL lung bases. No confluent airspace consolidation elsewhere in either lung. IMPRESSION: 1. Support apparatus satisfactory. 2. Stable mild LEFT LOWER LOBE pneumonia. 3. New linear atelectasis in the LEFT UPPER LOBE and BILATERAL lung bases. Electronically Signed   By: Hulan Saas M.D.   On: 07/25/2018 07:54   Dg Chest Port 1 View  Result Date: 07/24/2018 CLINICAL DATA:  Intubation. EXAM: PORTABLE CHEST 1 VIEW COMPARISON:  Radiograph yesterday. FINDINGS: Endotracheal tube tip at the thoracic inlet 3.2 cm from the carina. Progressive volume loss in the left hemithorax with increasing basilar and mid lung patchy opacity. Progressive vague opacity in the left upper lung zone, suggesting partial lobar collapse. Right lung base atelectasis. Heart is normal in size. IMPRESSION: 1. Endotracheal tube tip at the thoracic inlet 3.2 cm from the carina. 2. Progressive volume loss in the left hemithorax. New vague upper lung zone opacity suggesting partial lobar collapse. Increasing patchy opacities throughout the mid and lower lung zone may be atelectasis, aspiration, or infection. Electronically Signed   By: Narda Rutherford M.D.   On: 07/24/2018 02:31   Vas [place] Lower Extremity Venous (dvt)  Result Date: 07/24/2018  Lower Venous Study Indications: Stroke.  Performing Technologist: Blanch Media RVS  Examination Guidelines: A complete evaluation includes B-mode imaging, spectral Doppler, color Doppler, and power Doppler as needed of all accessible portions of each vessel. Bilateral testing is considered an integral part of a complete examination. Limited examinations  for reoccurring indications may be performed as noted.  Right Venous Findings: +---------+---------------+---------+-----------+----------+-------+          CompressibilityPhasicitySpontaneityPropertiesSummary +---------+---------------+---------+-----------+----------+-------+ CFV      Full           Yes      Yes                          +---------+---------------+---------+-----------+----------+-------+ SFJ      Full                                                 +---------+---------------+---------+-----------+----------+-------+ FV Prox  Full                                                 +---------+---------------+---------+-----------+----------+-------+ FV Mid   Full                                                 +---------+---------------+---------+-----------+----------+-------+  FV DistalFull                                                 +---------+---------------+---------+-----------+----------+-------+ PFV      Full                                                 +---------+---------------+---------+-----------+----------+-------+ POP      Full           Yes      Yes                          +---------+---------------+---------+-----------+----------+-------+ PTV      Full                                                 +---------+---------------+---------+-----------+----------+-------+ PERO     Full                                                 +---------+---------------+---------+-----------+----------+-------+  Left Venous Findings: +---------+---------------+---------+-----------+----------+-------+          CompressibilityPhasicitySpontaneityPropertiesSummary +---------+---------------+---------+-----------+----------+-------+ CFV      Full           Yes      Yes                          +---------+---------------+---------+-----------+----------+-------+ SFJ      Full                                                  +---------+---------------+---------+-----------+----------+-------+ FV Prox  Full                                                 +---------+---------------+---------+-----------+----------+-------+ FV Mid   Full                                                 +---------+---------------+---------+-----------+----------+-------+ FV DistalFull                                                 +---------+---------------+---------+-----------+----------+-------+ PFV      Full                                                 +---------+---------------+---------+-----------+----------+-------+  POP      Full           Yes      Yes                          +---------+---------------+---------+-----------+----------+-------+ PTV      Full                                                 +---------+---------------+---------+-----------+----------+-------+ PERO     Full                                                 +---------+---------------+---------+-----------+----------+-------+    Summary: Right: There is no evidence of deep vein thrombosis in the lower extremity. No cystic structure found in the popliteal fossa. Left: There is no evidence of deep vein thrombosis in the lower extremity. No cystic structure found in the popliteal fossa.  *See table(s) above for measurements and observations. Electronically signed by Fabienne Bruns MD on 07/24/2018 at 6:57:15 PM.    Final    2D Echocardiogram w/ bubble  1. The left ventricle has low normal systolic function, with an ejection fraction of 50-55%. The cavity size was normal. Left ventricular diastolic parameters were normal.  2. The right ventricle has normal systolc function. The cavity was normal. There is no increase in right ventricular wall thickness.  3. Negative bubble study for right to left shunting.  4. The aortic valve was not well visualized Aortic valve regurgitation is trivial by color flow  Doppler.   PHYSICAL EXAM:  Temp:  [98.9 F (37.2 C)] 98.9 F (37.2 C) (04/11 0013) Pulse Rate:  [58-79] 77 (04/11 1112) Resp:  [14-23] 15 (04/11 1112) BP: (126-162)/(75-98) 162/98 (04/11 1112) SpO2:  [90 %-100 %] 100 % (04/11 1112) FiO2 (%):  [40 %] 40 % (04/11 1112) Weight:  [75.2 kg] 75.2 kg (04/11 0500)  General - Well nourished, well developed, not in acute distress.  Ophthalmologic - fundi not visualized due to noncooperation.  Cardiovascular - Regular rhythm and rate  Neuro - extubated, eyes open but not traking, orientated to self and age and place, but not time. Bradyphonia, mild dysarthria. Not cooperative on naming or repetition. able to have speak in short sentences, paucity of speech and able to follow limited simple commands. PERRL, eyes in mid position, not blinking to visual threat bilaterally, left gaze incomplete, but not cooperative with right gaze. Right facial droop.  Tongue midline in mouth. LUE and LLE spontaneous movement against gravity, strong, at least 4/5, however, RUE flaccid and RLE 2/5 with pain stimulation. triple reflex present on the right LE. DTR 1+ and right babinski. Sensation, coordination not cooperative and gait not tested.   ASSESSMENT/PLAN Mr. Corey Li is a 48 y.o. male with history of thyroid disease, sleep apnea, PTSD, neuropathy, hypertension, hyperlipidemia, diabetes with recent ACDF at the level of C5-C7 who lives in a hotel presenting on POD #6 with confusion that progressed to obtundation and rhonchorous respirations in the ED.   Stroke:  bilateral ischemic infarcts in various territories, largest in bilateral PCA territory, but also including left AchA, punctate bilateral MCA and bilateral ACA infarcts, embolic pattern consistent with cardio  embolic source. Occult afib vs. hypercoagulable state with DKA  Code Stroke CT head No acute stroke.     CT CS corpectomy at C6. Anterior and posterior fusion C5-C7. Hardware in position. Postop  prevertebral soft tissue swelling  CTA head & neck no ELVO. Diffuse irreg narrowing sm and med vessels. patcy ground glass opacities B lung apices. 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   2D Echo EF 50-55%. No source of embolus, negative bubble study  LE doppler neg DVT  Blood cultures seem to be contamination, low suspicious for endocarditis given no fever  Recommend 30 day cardiac event monitoring vs. Loop recorder to rule out afib  LDL 72  HgbA1c 10.9  UDS neg  Lovenox 40 mg sq daily for VTE prophylaxis  No antithrombotic prior to admission, now on ASA.   Therapy recommendations:  pending   Disposition:  pending   Acute Respiratory Failure Aspiration PNA  Extubated 07/25/18  Now on room air, tolerating well  CTA neck b/l apices patchy ground glass opacities  Low probability for COVID  DKA w/ Diabetes type II  HgbA1c 10.9, goal < 7.0  uncontrolled  Hyperglycemia  Elevated Beta-hydroxybutyric acid  Now on levemir  Management per primary team  Positive blood culture, likely contamination  RVP neg  Blood culture 1/2 G+ staph but coagulase negative   Sputum culture - MODERATE GROUP B STREP(S.AGALACTIAE)  CCM on board  On cefepime ; off vanco  WBCs - 18.2->21.6->14.2  Hypertension  Stable on the high end . On amlodipine  . Long-term BP goal normotensive  Hyperlipidemia  Home meds:  lipitor 80  LDL 72, goal < 70  Resume lipitor 80  Continue statin at discharge  Tobacco abuse  Current smoker  Smoking cessation counseling will be provided  Other Stroke Risk Factors  Hx ETOH use  Obstructive sleep apnea  Other Active Problems  Recent cervical decompression and fusion at the level of C5-C7 w/ L shoulder and arm pain (Dumonski)  AKI  Cre 3.00->2.67->1.95->1.74->1.15->0.85  Hospital day # 3  This patient is critically ill due to multifocal infarcts, bacteremia, sepsis, respiratory failure, DKA and at significant risk of neurological worsening, death form recurrent stroke, hemorrhagic conversion, septic shock, seizure. This patient's care requires constant monitoring of vital signs, hemodynamics, respiratory and cardiac monitoring, review of multiple databases, neurological assessment, discussion with family, other specialists and medical decision making of high complexity. I spent 35 minutes of neurocritical care time in the care of this patient.  I also discussed with Dr. Marchelle Gearing.  Marvel Plan, MD PhD Stroke Neurology 07/25/2018 2:01 PM   To contact Stroke Continuity provider, please refer to [email or web address]. After hours, contact General Neurology

## 2018-07-25 NOTE — Procedures (Signed)
Extubation Procedure Note  Patient Details:   Name: Corey Li DOB: 12-Mar-1971 MRN: [id number]   Airway Documentation:    Vent end date: 07/25/18 Vent end time: 1235   Evaluation  O2 sats: stable throughout Complications: No apparent complications Patient did tolerate procedure well. Bilateral Breath Sounds: Diminished, Rhonchi   Yes   Patient extubated to 3L nasal cannula per MD order.  Positive cuff leak noted.  No evidence of stridor.  Patient able to speak post extubation.  Sats and vitals currently stable.  No complications noted.    Elyn Peers 07/25/2018, 12:38 PM

## 2018-07-26 ENCOUNTER — Inpatient Hospital Stay (HOSPITAL_COMMUNITY): Payer: Medicaid Other

## 2018-07-26 DIAGNOSIS — E0811 Diabetes mellitus due to underlying condition with ketoacidosis with coma: Secondary | ICD-10-CM

## 2018-07-26 DIAGNOSIS — N179 Acute kidney failure, unspecified: Secondary | ICD-10-CM

## 2018-07-26 DIAGNOSIS — H47619 Cortical blindness, unspecified side of brain: Secondary | ICD-10-CM

## 2018-07-26 DIAGNOSIS — Z4659 Encounter for fitting and adjustment of other gastrointestinal appliance and device: Secondary | ICD-10-CM

## 2018-07-26 LAB — BASIC METABOLIC PANEL
Anion gap: 12 (ref 5–15)
BUN: 12 mg/dL (ref 6–20)
CO2: 27 mmol/L (ref 22–32)
Calcium: 9.4 mg/dL (ref 8.9–10.3)
Chloride: 97 mmol/L — ABNORMAL LOW (ref 98–111)
Creatinine, Ser: 0.76 mg/dL (ref 0.61–1.24)
GFR calc Af Amer: >60 mL/min (ref >60)
GFR calc non Af Amer: >60 mL/min (ref >60)
Glucose, Bld: 114 mg/dL — ABNORMAL HIGH (ref 70–99)
Potassium: 3.7 mmol/L (ref 3.5–5.1)
Sodium: 136 mmol/L (ref 135–145)

## 2018-07-26 LAB — HEPATIC FUNCTION PANEL
ALT: 25 U/L (ref 0–44)
AST: 44 U/L — ABNORMAL HIGH (ref 15–41)
Albumin: 2.9 g/dL — ABNORMAL LOW (ref 3.5–5.0)
Alkaline Phosphatase: 93 U/L (ref 38–126)
Bilirubin, Direct: 0.2 mg/dL (ref 0.0–0.2)
Indirect Bilirubin: 0.5 mg/dL (ref 0.3–0.9)
Total Bilirubin: 0.7 mg/dL (ref 0.3–1.2)
Total Protein: 6.7 g/dL (ref 6.5–8.1)

## 2018-07-26 LAB — CBC WITH DIFFERENTIAL/PLATELET
Abs Immature Granulocytes: 0.09 10*3/uL — ABNORMAL HIGH (ref 0.00–0.07)
Basophils Absolute: 0.1 10*3/uL (ref 0.0–0.1)
Basophils Relative: 0 %
Eosinophils Absolute: 0.1 10*3/uL (ref 0.0–0.5)
Eosinophils Relative: 1 %
HCT: 30.7 % — ABNORMAL LOW (ref 39.0–52.0)
Hemoglobin: 10 g/dL — ABNORMAL LOW (ref 13.0–17.0)
Immature Granulocytes: 1 %
Lymphocytes Relative: 7 %
Lymphs Abs: 1.2 10*3/uL (ref 0.7–4.0)
MCH: 29.9 pg (ref 26.0–34.0)
MCHC: 32.6 g/dL (ref 30.0–36.0)
MCV: 91.6 fL (ref 80.0–100.0)
Monocytes Absolute: 0.9 10*3/uL (ref 0.1–1.0)
Monocytes Relative: 5 %
Neutro Abs: 14.1 10*3/uL — ABNORMAL HIGH (ref 1.7–7.7)
Neutrophils Relative %: 86 %
Platelets: 329 10*3/uL (ref 150–400)
RBC: 3.35 MIL/uL — ABNORMAL LOW (ref 4.22–5.81)
RDW: 14.4 % (ref 11.5–15.5)
WBC: 16.5 10*3/uL — ABNORMAL HIGH (ref 4.0–10.5)
nRBC: 0 % (ref 0.0–0.2)

## 2018-07-26 LAB — GLUCOSE, CAPILLARY
Glucose-Capillary: 97 mg/dL (ref 70–99)
Glucose-Capillary: 143 mg/dL — ABNORMAL HIGH (ref 70–99)
Glucose-Capillary: 174 mg/dL — ABNORMAL HIGH (ref 70–99)
Glucose-Capillary: 161 mg/dL — ABNORMAL HIGH (ref 70–99)
Glucose-Capillary: 102 mg/dL — ABNORMAL HIGH (ref 70–99)
Glucose-Capillary: 52 mg/dL — ABNORMAL LOW (ref 70–99)

## 2018-07-26 LAB — PHOSPHORUS: Phosphorus: 2 mg/dL — ABNORMAL LOW (ref 2.5–4.6)

## 2018-07-26 LAB — MAGNESIUM: Magnesium: 2.2 mg/dL (ref 1.7–2.4)

## 2018-07-26 MED ORDER — ORAL CARE MOUTH RINSE
15.0000 mL | Freq: Two times a day (BID) | OROMUCOSAL | Status: DC
  Administered 2018-07-26 – 2018-08-02 (×14): 15 mL via OROMUCOSAL

## 2018-07-26 MED ORDER — HALOPERIDOL LACTATE 5 MG/ML IJ SOLN
1.0000 mg | Freq: Four times a day (QID) | INTRAMUSCULAR | Status: DC | PRN
  Administered 2018-07-27 – 2018-08-01 (×2): 1 mg via INTRAVENOUS
  Filled 2018-07-26 (×2): qty 1

## 2018-07-26 MED ORDER — LISINOPRIL 20 MG PO TABS
20.0000 mg | ORAL_TABLET | Freq: Every day | ORAL | Status: DC
  Administered 2018-07-27 – 2018-08-01 (×5): 20 mg via ORAL
  Filled 2018-07-26 (×5): qty 1

## 2018-07-26 MED ORDER — DEXTROSE 50 % IV SOLN: 25.0000 g | INTRAVENOUS | Status: AC

## 2018-07-26 MED ORDER — SODIUM CHLORIDE 0.9 % IV SOLN
INTRAVENOUS | Status: DC
  Administered 2018-07-26 – 2018-08-01 (×12): via INTRAVENOUS

## 2018-07-26 MED ORDER — DEXTROSE 50 % IV SOLN
INTRAVENOUS | Status: AC
  Administered 2018-07-26: 25 mL
  Filled 2018-07-26: qty 50

## 2018-07-26 NOTE — Progress Notes (Signed)
Assisted tele visit to patient with friend.   Thomasenia Bottoms, RN

## 2018-07-26 NOTE — Progress Notes (Signed)
[facility] PROGRESS NOTE    Progress Note  Corey Li  VOH:[id number] DOB: 01-26-1971 DOA: 07/22/2018 PCP: Marliss Coots, NP     Brief Narrative:   Corey Li is an 48 y.o. male past medical history diabetes mellitus type 2 status post recent laminectomy on 4142 presenting with altered mental status and hypoxia with some right-sided weakness to the ED code stroke was called.  Assessment/Plan:  B/lA bilateral ischemic infarcts largest bilateral PCA territory including the left Acha, bilateral punctuate MCA and bilateral ACA infarcts likely embolic from cardioembolic source versus hypercoagulable state with DKA:: CT of the head showed no acute stroke. CT of the cervical spine showed posterior fusion of C5 C7 with possible Perative swelling. CTA of the head and neck showed no ELVO, with diffuse narrowing and patchy ground opacity of the lung apices bilateral. MRI of the brain showed multi-ischemic insults in multiple vascular territories largest in the PCA territory but including the left Acha, bilateral MCA infarcts. 2D echo showed an EF of 50% negative bubble study. Lower extremity Doppler was negative. LDL was 72 hemoglobin A1c was 10.9 urine drug screen was negative. Now on aspirin 325 physical therapy evaluation is pending. Neurology was consulted who recommended a cardiac event monitor versus loop recorder to rule out A. Fib. Perform a swallowing evaluation.  Acute respiratory failure with hypoxia: Patient extubated on 07/25/2018 transferred [facility] on 07/26/2018. He is low probability for COVID-19. A CTA of the head and neck showed bilateral apices groundglass opacity, there was a concern of mucous plugging on 07/23/2018. Likely due to aspiration pneumonia currently on IV Rocephin. Chest x-ray is pending  DKA/diabetes mellitus type 2: Now off the insulin drip bicarb greater than 20 anion gap is closed. A1c of 10.9. Only on Levemir and sliding scale insulin  with good control.  Will need to follow-up with PCP as an outpatient.  Acute kidney injury: Likely prerenal azotemia resolved with IV fluid hydration creatinine now at baseline.  Acute metabolic encephalopathy/acute confusional state: EKG performed showed a QTC of 477. Started on Haldol IV as needed. Check a 12-lead EKG tomorrow morning. 1 out of 2 blood culture positive for staph coag negative staph. RSVP negative, likely a contaminant he has remained afebrile DC antibiotics  Essential hypertension: Continue amlodipine continue to monitor blood pressure.  Hyperlipidemia: Continue Lipitor, LDL greater than 80 Continue statins discharge.  Tobacco abuse  Current smoker.    DVT prophylaxis: lovenox Family Communication:none Disposition Plan/Barrier to D/C: unable to detemrine Code Status:     Code Status Orders  (From admission, onward)         Start     Ordered   07/22/18 1510  Full code  Continuous     07/22/18 1512        Code Status History    Date Active Date Inactive Code Status Order ID Comments User Context   07/22/2018 1433 07/22/2018 1512 Full Code [id number]  Cristal Generous, NP ED   07/15/2018 1818 07/18/2018 1912 Full Code [id number]  Justice Britain, PA-C Inpatient    Advance Directive Documentation     Most Recent Value  Type of Advance Directive  Living will, Healthcare Power of Attorney  Pre-existing out of facility DNR order (yellow form or pink MOST form)  -  "MOST" Form in Place?  -        IV Access:    Peripheral IV   Procedures and diagnostic studies:   Dg Chest [facility] 1 [address]  Result Date: 07/25/2018 CLINICAL DATA:  Ventilator dependent respiratory failure. Follow-up LEFT LOWER LOBE atelectasis and/or pneumonia. EXAM: PORTABLE CHEST 1 VIEW COMPARISON:  07/24/2018 and earlier. FINDINGS: Endotracheal tube tip in satisfactory position approximately 3 cm above carina. Gastric tube courses below the diaphragm into the stomach. Heart size normal  for AP portable technique. Mild airspace consolidation in the LEFT LOWER LOBE, unchanged. New linear atelectasis in the LEFT UPPER LOBE and the BILATERAL lung bases. No confluent airspace consolidation elsewhere in either lung. IMPRESSION: 1. Support apparatus satisfactory. 2. Stable mild LEFT LOWER LOBE pneumonia. 3. New linear atelectasis in the LEFT UPPER LOBE and BILATERAL lung bases. Electronically Signed   By: Evangeline Dakin M.D.   On: 07/25/2018 07:54   Vas [place] Lower Extremity Venous (dvt)  Result Date: 07/24/2018  Lower Venous Study Indications: Stroke.  Performing Technologist: Abram Sander RVS  Examination Guidelines: A complete evaluation includes B-mode imaging, spectral Doppler, color Doppler, and power Doppler as needed of all accessible portions of each vessel. Bilateral testing is considered an integral part of a complete examination. Limited examinations for reoccurring indications may be performed as noted.  Right Venous Findings: +---------+---------------+---------+-----------+----------+-------+          CompressibilityPhasicitySpontaneityPropertiesSummary +---------+---------------+---------+-----------+----------+-------+ CFV      Full           Yes      Yes                          +---------+---------------+---------+-----------+----------+-------+ SFJ      Full                                                 +---------+---------------+---------+-----------+----------+-------+ FV Prox  Full                                                 +---------+---------------+---------+-----------+----------+-------+ FV Mid   Full                                                 +---------+---------------+---------+-----------+----------+-------+ FV DistalFull                                                 +---------+---------------+---------+-----------+----------+-------+ PFV      Full                                                  +---------+---------------+---------+-----------+----------+-------+ POP      Full           Yes      Yes                          +---------+---------------+---------+-----------+----------+-------+ PTV      Full                                                 +---------+---------------+---------+-----------+----------+-------+  PERO     Full                                                 +---------+---------------+---------+-----------+----------+-------+  Left Venous Findings: +---------+---------------+---------+-----------+----------+-------+          CompressibilityPhasicitySpontaneityPropertiesSummary +---------+---------------+---------+-----------+----------+-------+ CFV      Full           Yes      Yes                          +---------+---------------+---------+-----------+----------+-------+ SFJ      Full                                                 +---------+---------------+---------+-----------+----------+-------+ FV Prox  Full                                                 +---------+---------------+---------+-----------+----------+-------+ FV Mid   Full                                                 +---------+---------------+---------+-----------+----------+-------+ FV DistalFull                                                 +---------+---------------+---------+-----------+----------+-------+ PFV      Full                                                 +---------+---------------+---------+-----------+----------+-------+ POP      Full           Yes      Yes                          +---------+---------------+---------+-----------+----------+-------+ PTV      Full                                                 +---------+---------------+---------+-----------+----------+-------+ PERO     Full                                                  +---------+---------------+---------+-----------+----------+-------+    Summary: Right: There is no evidence of deep vein thrombosis in the lower extremity. No cystic structure found in the popliteal fossa. Left: There is no evidence of deep vein thrombosis in the lower extremity. No cystic structure found in the popliteal fossa.  *  See table(s) above for measurements and observations. Electronically signed by Ruta Hinds MD on 07/24/2018 at 6:57:15 PM.    Final      Medical Consultants:    None.  Anti-Infectives:   Rocephin  Subjective:    Corey Li is confused moaning and groaning  Objective:    Vitals:   07/25/18 2340 07/26/18 0328 07/26/18 0400 07/26/18 0500  BP:   (!) 159/83   Pulse:   83   Resp:      Temp: 98.8 F (37.1 C) 98.4 F (36.9 C)    TempSrc:      SpO2:   95%   Weight:    71.7 kg  Height:        Intake/Output Summary (Last 24 hours) at 07/26/2018 0722 Last data filed at 07/26/2018 0600 Gross per 24 hour  Intake 995.17 ml  Output 3250 ml  Net -2254.83 ml   Filed Weights   07/24/18 0423 07/25/18 0500 07/26/18 0500  Weight: 73.8 kg 75.2 kg 71.7 kg    Exam: General exam: In no acute distress. Respiratory system: Good air movement and clear to auscultation. Cardiovascular system: S1 & S2 heard, RRR.  Gastrointestinal system: Abdomen is nondistended, soft and nontender.  Central nervous system: He opens his eyes is not oriented to place or person has mild dysarthria he is not cooperating pupils equally round and reactive non-cooperating he has a right facial droop tongue is midline his left side is intact but his right upper and lower extremity is about 3 out of 5. Extremities: No pedal edema. Skin: No rashes, lesions or ulcers   Data Reviewed:    Labs: Basic Metabolic Panel: Recent Labs  Lab 07/22/18 1627  07/23/18 0448 07/23/18 0834 07/23/18 0946 07/23/18 1358 07/24/18 0014 07/25/18 0350 07/26/18 0549  NA  --    < > 140 143 144 143  140 140 136  K  --    < > 3.7 3.5 3.3* 3.7 3.6 3.6 3.7  CL  --    < > 103 104  --   --  103 103 97*  CO2  --    < > 24 26  --   --  '25 24 27  '$ GLUCOSE  --    < > 224* 101*  --   --  166* 211* 114*  BUN  --    < > 53* 47*  --   --  24* 21* 12  CREATININE  --    < > 1.95* 1.74*  --   --  1.15 0.85 0.76  CALCIUM  --    < > 9.0 8.9  --   --  8.4* 8.3* 9.4  MG 2.6*  --  2.4  --   --   --  2.2  --  2.2  PHOS 5.8*  --  <1.0* <1.0*  --   --  3.1  3.2 1.6* 2.0*   < > = values in this interval not displayed.   GFR Estimated Creatinine Clearance: 111.7 mL/min (by C-G formula based on SCr of 0.76 mg/dL). Liver Function Tests: Recent Labs  Lab 07/22/18 1234 07/24/18 0014 07/25/18 0350 07/26/18 0549  AST 40  --   --  44*  ALT 35  --   --  25  ALKPHOS 121  --   --  93  BILITOT 2.1*  --   --  0.7  PROT 8.1  --   --  6.7  ALBUMIN 4.1 3.1* 2.7* 2.9*  Recent Labs  Lab 07/22/18 1627  LIPASE 73*  AMYLASE 172*   No results for input(s): AMMONIA in the last 168 hours. Coagulation profile Recent Labs  Lab 07/22/18 1234  INR 1.2    CBC: Recent Labs  Lab 07/22/18 1234  07/23/18 0448 07/23/18 0946 07/23/18 1358 07/24/18 0014 07/25/18 0350 07/26/18 0549  WBC 18.2*  --  18.2*  --   --  21.6* 14.2* 16.5*  NEUTROABS 15.3*  --   --   --   --   --   --  14.1*  HGB 10.7*   < > 9.1* 8.8* 14.6 9.3* 9.3* 10.0*  HCT 35.8*   < > 25.6* 26.0* 43.0 25.7* 27.2* 30.7*  MCV 104.1*  --  86.5  --   --  89.2 91.6 91.6  PLT 482*  --  348  --   --  295 290 329   < > = values in this interval not displayed.   Cardiac Enzymes: Recent Labs  Lab 07/22/18 1627  TROPONINI <0.03   BNP (last 3 results) No results for input(s): PROBNP in the last 8760 hours. CBG: Recent Labs  Lab 07/25/18 1228 07/25/18 1704 07/25/18 2108 07/25/18 2338 07/26/18 0329  GLUCAP 215* 113* 108* 115* 97   D-Dimer: No results for input(s): DDIMER in the last 72 hours. Hgb A1c: No results for input(s): HGBA1C in the  last 72 hours. Lipid Profile: Recent Labs    07/23/18 1236 07/24/18 0014  CHOL  --  148  HDL  --  37*  LDLCALC  --  72  TRIG 99 195*  CHOLHDL  --  4.0   Thyroid function studies: No results for input(s): TSH, T4TOTAL, T3FREE, THYROIDAB in the last 72 hours.  Invalid input(s): FREET3 Anemia work up: No results for input(s): VITAMINB12, FOLATE, FERRITIN, TIBC, IRON, RETICCTPCT in the last 72 hours. Sepsis Labs: Recent Labs  Lab 07/22/18 1331 07/22/18 1627 07/23/18 0448 07/24/18 0014 07/25/18 0350 07/26/18 0549  PROCALCITON  --  0.77  --   --   --   --   WBC  --   --  18.2* 21.6* 14.2* 16.5*  LATICACIDVEN 2.9* 2.2*  --   --   --   --    Microbiology Recent Results (from the past 240 hour(s))  Urine culture     Status: None   Collection Time: 07/22/18  1:42 PM  Result Value Ref Range Status   Specimen Description URINE, RANDOM  Final   Special Requests NONE  Final   Culture   Final    NO GROWTH Performed at [facility], [address], [place], [place] [address]    Report Status 07/23/2018 FINAL  Final  Blood culture (routine x 2)     Status: Abnormal   Collection Time: 07/22/18  2:29 PM  Result Value Ref Range Status   Specimen Description BLOOD LEFT ANTECUBITAL  Final   Special Requests   Final    BOTTLES DRAWN AEROBIC AND ANAEROBIC Blood Culture adequate volume   Culture  Setup Time   Final    GRAM POSITIVE COCCI IN BOTH AEROBIC AND ANAEROBIC BOTTLES Organism ID to follow CRITICAL RESULT CALLED TO, READ BACK BY AND VERIFIED WITH: Royann Shivers PharmD 11:50 07/23/18 ([email or web address])    Culture (A)  Final    STAPHYLOCOCCUS SPECIES (COAGULASE NEGATIVE) THE SIGNIFICANCE OF ISOLATING THIS ORGANISM FROM A SINGLE SET OF BLOOD CULTURES WHEN MULTIPLE SETS ARE DRAWN IS UNCERTAIN. PLEASE NOTIFY [place]  WITHIN ONE WEEK IF SPECIATION AND SENSITIVITIES ARE REQUIRED. Performed at [facility], [place] [address] [place], [place] [address]    Report  Status 07/25/2018 FINAL  Final  Blood Culture ID Panel (Reflexed)     Status: Abnormal   Collection Time: 07/22/18  2:29 PM  Result Value Ref Range Status   Enterococcus species NOT DETECTED NOT DETECTED Final   Listeria monocytogenes NOT DETECTED NOT DETECTED Final   Staphylococcus species DETECTED (A) NOT DETECTED Final    Comment: Methicillin (oxacillin) resistant coagulase negative staphylococcus. Possible blood culture contaminant (unless isolated from more than one blood culture draw or clinical case suggests pathogenicity). No antibiotic treatment is indicated for blood  culture contaminants. CRITICAL RESULT CALLED TO, READ BACK BY AND VERIFIED WITH: Royann Shivers PharmD 11:50 07/23/18 ([email or web address])    Staphylococcus aureus (BCID) NOT DETECTED NOT DETECTED Final   Methicillin resistance DETECTED (A) NOT DETECTED Final    Comment: CRITICAL RESULT CALLED TO, READ BACK BY AND VERIFIED WITH: Royann Shivers PharmD 11:50 07/23/18 ([email or web address])    Streptococcus species NOT DETECTED NOT DETECTED Final   Streptococcus agalactiae NOT DETECTED NOT DETECTED Final   Streptococcus pneumoniae NOT DETECTED NOT DETECTED Final   Streptococcus pyogenes NOT DETECTED NOT DETECTED Final   Acinetobacter baumannii NOT DETECTED NOT DETECTED Final   Enterobacteriaceae species NOT DETECTED NOT DETECTED Final   Enterobacter cloacae complex NOT DETECTED NOT DETECTED Final   Escherichia coli NOT DETECTED NOT DETECTED Final   Klebsiella oxytoca NOT DETECTED NOT DETECTED Final   Klebsiella pneumoniae NOT DETECTED NOT DETECTED Final   Proteus species NOT DETECTED NOT DETECTED Final   Serratia marcescens NOT DETECTED NOT DETECTED Final   Haemophilus influenzae NOT DETECTED NOT DETECTED Final   Neisseria meningitidis NOT DETECTED NOT DETECTED Final   Pseudomonas aeruginosa NOT DETECTED NOT DETECTED Final   Candida albicans NOT DETECTED NOT DETECTED Final   Candida glabrata NOT DETECTED NOT DETECTED Final   Candida krusei NOT  DETECTED NOT DETECTED Final   Candida parapsilosis NOT DETECTED NOT DETECTED Final   Candida tropicalis NOT DETECTED NOT DETECTED Final    Comment: Performed at [facility], [place]. [address], [place], [place] [address]  Blood culture (routine x 2)     Status: None (Preliminary result)   Collection Time: 07/22/18  4:26 PM  Result Value Ref Range Status   Specimen Description BLOOD RIGHT ANTECUBITAL  Final   Special Requests   Final    BOTTLES DRAWN AEROBIC ONLY Blood Culture adequate volume   Culture   Final    NO GROWTH 3 DAYS Performed at [facility], [place] [address], [place], [place] [address]    Report Status PENDING  Incomplete  Respiratory Panel by PCR     Status: None   Collection Time: 07/22/18  5:08 PM  Result Value Ref Range Status   Adenovirus NOT DETECTED NOT DETECTED Final   Coronavirus 229E NOT DETECTED NOT DETECTED Final    Comment: (NOTE) The Coronavirus on the Respiratory Panel, DOES NOT test for the novel  Coronavirus (2019 nCoV)    Coronavirus HKU1 NOT DETECTED NOT DETECTED Final   Coronavirus NL63 NOT DETECTED NOT DETECTED Final   Coronavirus OC43 NOT DETECTED NOT DETECTED Final   Metapneumovirus NOT DETECTED NOT DETECTED Final   Rhinovirus / Enterovirus NOT DETECTED NOT DETECTED Final   Influenza A NOT DETECTED NOT DETECTED Final   Influenza B NOT DETECTED NOT DETECTED Final   Parainfluenza Virus 1  NOT DETECTED NOT DETECTED Final   Parainfluenza Virus 2 NOT DETECTED NOT DETECTED Final   Parainfluenza Virus 3 NOT DETECTED NOT DETECTED Final   Parainfluenza Virus 4 NOT DETECTED NOT DETECTED Final   Respiratory Syncytial Virus NOT DETECTED NOT DETECTED Final   Bordetella pertussis NOT DETECTED NOT DETECTED Final   Chlamydophila pneumoniae NOT DETECTED NOT DETECTED Final   Mycoplasma pneumoniae NOT DETECTED NOT DETECTED Final    Comment: Performed at [facility], [place] [address], [place], [place] [address]  MRSA PCR Screening     Status: None    Collection Time: 07/23/18  9:31 AM  Result Value Ref Range Status   MRSA by PCR NEGATIVE NEGATIVE Final    Comment:        The GeneXpert MRSA Assay (FDA approved for NASAL specimens only), is one component of a comprehensive MRSA colonization surveillance program. It is not intended to diagnose MRSA infection nor to guide or monitor treatment for MRSA infections. Performed at [facility], [place] [address], [place], [place] [address]   Culture, respiratory (non-expectorated)     Status: None   Collection Time: 07/23/18 11:42 AM  Result Value Ref Range Status   Specimen Description TRACHEAL ASPIRATE  Final   Special Requests NONE  Final   Gram Stain   Final    NO WBC SEEN FEW BUDDING YEAST SEEN FEW GRAM POSITIVE RODS FEW GRAM POSITIVE COCCI IN PAIRS    Culture   Final    MODERATE CANDIDA TROPICALIS MODERATE GROUP B STREP(S.AGALACTIAE)ISOLATED TESTING AGAINST S. AGALACTIAE NOT ROUTINELY PERFORMED DUE TO PREDICTABILITY OF AMP/PEN/VAN SUSCEPTIBILITY. Performed at [facility], [place] [address], [place], [place] [address]    Report Status 07/25/2018 FINAL  Final     Medications:   . amLODipine  10 mg Oral Daily  . aspirin EC  325 mg Oral Daily   Or  . aspirin  300 mg Rectal Daily  . atorvastatin  80 mg Oral Daily  . chlorhexidine gluconate (MEDLINE KIT)  15 mL Mouth Rinse BID  . enoxaparin (LOVENOX) injection  40 mg Subcutaneous Q24H  . etomidate  20 mg Intravenous Once  . feeding supplement (PRO-STAT SUGAR FREE 64)  30 mL Per Tube BID  . insulin aspart  3-9 Units Subcutaneous Q4H  . insulin detemir  18 Units Subcutaneous Q12H  . ipratropium-albuterol  3 mL Nebulization BID  . levothyroxine  175 mcg Per Tube QAC breakfast  . mouth rinse  15 mL Mouth Rinse 10 times per day  . pantoprazole sodium  40 mg Per Tube Daily  . rocuronium  100 mg Intravenous Once   Continuous Infusions: . sodium chloride 10 mL/hr at 07/23/18 0700  . cefTRIAXone (ROCEPHIN)  IV  Stopped (07/25/18 1656)  . feeding supplement (GLUCERNA 1.5 CAL) Stopped (07/25/18 1205)      LOS: 4 days   Charlynne Cousins  [facility]  07/26/2018, 7:22 AM

## 2018-07-26 NOTE — Progress Notes (Signed)
Rehab Admissions Coordinator Note:  Per OT recommendation, this patient was screened by Nanine Means for appropriateness for an Inpatient Acute Rehab Consult.  At this time, we will follow along to help determine most appropriate post acute venue. Pt is currently total assist for most activity assessed per OT eval. Pt will need to show greater participation and tolerance for activity in order to be considered for CIR.   Please call if questions.   Nanine Means, OTR/L  Rehab Admissions Coordinator  [phone] 07/26/2018 11:33 AM

## 2018-07-26 NOTE — Evaluation (Signed)
Occupational Therapy Evaluation Patient Details Name: Corey Li MRN: [id number] DOB: 1970/07/08 Today's Date: 07/26/2018    History of Present Illness Pt is a 48 yo male s/p multifocal infarcts, bacteremia, sepsis and DKA. Pt PMHx: 4/1 A/P CDF C5-7, DM, anxiety, depression, HTN.   Clinical Impression   Pt PTA: living in hotel, working, recovering from A/P CDF C5-7. Pt currently limited by CVA with R side flaccidity. No tone noted. Pt negative for clonus at R ankle. Pt's L side appears, WFL and strong. Poor to zero stability in trunk as pt seated in chair position in bed;pt with head turned to L and arm moving intermittently. Pt attempting to wash face with LUE, but poor coordination noted. Poor speech and incomprehensible. Pt able to reach rail with LUE to pull self to unsupported sitting with maxA  For 3 secs before totally giving out. PROM to RUE- flaccid. Pt greatly requires continued OT skilled services for ADL, mobility and safety in [facility] vc SNF setting based on progress of activity tolerance. OT to follow acutely.     Follow Up Recommendations  CIR;SNF;Supervision/Assistance - 24 hour(to be determined as pt has poor activity tolerance)    Equipment Recommendations  Other (comment)(to be determined)    Recommendations for Other Services Rehab consult     Precautions / Restrictions Precautions Precautions: Cervical;Fall;Other (comment) Precaution Comments: went over precautions for recent C spine sx; foley catheter, mitts for restraints on BUE hands. Restrictions Weight Bearing Restrictions: No      Mobility Bed Mobility Overal bed mobility: Needs Assistance   Rolling: Total assist         General bed mobility comments: pt sat up using chair position and reaching forward on rails for 3 secs before completely giving out  Transfers Overall transfer level: Needs assistance               General transfer comment: DNT- not safe to test; flaccidity on R side     Balance Overall balance assessment: Needs assistance Sitting-balance support: Single extremity supported;Feet supported Sitting balance-Leahy Scale: Zero                                     ADL either performed or assessed with clinical judgement   ADL Overall ADL's : Needs assistance/impaired Eating/Feeding: NPO   Grooming: Maximal assistance;Total assistance;Bed level Grooming Details (indicate cue type and reason): attempting to wash face with L hand, but very poor coordination Upper Body Bathing: Maximal assistance;Total assistance;Bed level   Lower Body Bathing: Maximal assistance;Total assistance;Bed level   Upper Body Dressing : Maximal assistance;Total assistance;Bed level   Lower Body Dressing: Maximal assistance;Total assistance;Bed level   Toilet Transfer: Total assistance   Toileting- Clothing Manipulation and Hygiene: Total assistance;Bed level(catheter)       Functional mobility during ADLs: Maximal assistance;Total assistance General ADL Comments: Pt limited at this time due to decreased cognition and R side flaccidity.     Vision Baseline Vision/History: No visual deficits Vision Assessment?: Vision impaired- to be further tested in functional context;Yes Eye Alignment: Impaired (comment) Ocular Range of Motion: Impaired-to be further tested in functional context Alignment/Gaze Preference: Head turned;Gaze left Tracking/Visual Pursuits: Impaired - to be further tested in functional context Additional Comments: head turned to L and unable to track or scan     Perception     Praxis      Pertinent Vitals/Pain Pain Assessment: Faces Faces Pain Scale:  Hurts a little bit Pain Location: "all over" Pain Descriptors / Indicators: Aching Pain Intervention(s): Limited activity within patient's tolerance     Hand Dominance Right(now flaccid)   Extremity/Trunk Assessment Upper Extremity Assessment Upper Extremity Assessment: RUE  deficits/detail;LUE deficits/detail;Generalized weakness RUE Deficits / Details: FLACCID; in mitt restraints RUE Sensation: WNL RUE Coordination: decreased fine motor;decreased gross motor LUE Deficits / Details: WFLs in mitt restraints LUE Sensation: WNL LUE Coordination: decreased fine motor;decreased gross motor   Lower Extremity Assessment Lower Extremity Assessment: Defer to PT evaluation;RLE deficits/detail RLE Deficits / Details: FLACCID   Cervical / Trunk Assessment Cervical / Trunk Assessment: Kyphotic   Communication Communication Communication: Expressive difficulties   Cognition Arousal/Alertness: Lethargic;Suspect due to medications Behavior During Therapy: Agitated;Flat affect;Restless Overall Cognitive Status: Impaired/Different from baseline Area of Impairment: Attention;Following commands;Safety/judgement;Awareness                               General Comments: pt following some commands- difficult to understand and unaware of deficits   General Comments  Pt following some commands, but lethargic. pt coughing a lot, but unable to verbalize much information and be comprehensive.    Exercises Exercises: Other exercises Other Exercises Other Exercises: RUE PROM Shoulder through digits, 10 reps, 1 set. Other Exercises: LUE AAROM with some resistance due to agitation for shoulder through hand ROM.   Shoulder Instructions      Home Living Family/patient expects to be discharged to:: Skilled nursing facility                                        Prior Functioning/Environment Level of Independence: Independent        Comments: recent back surgery; lives in hotel,  was working recently in [employer].        OT Problem List: Decreased strength;Decreased activity tolerance;Impaired balance (sitting and/or standing);Impaired vision/perception;Decreased coordination;Decreased cognition;Decreased safety awareness;Impaired UE  functional use;Increased edema      OT Treatment/Interventions: Self-care/ADL training;Therapeutic exercise;Neuromuscular education;Energy conservation;DME and/or AE instruction;Therapeutic activities;Balance training;Visual/perceptual remediation/compensation;Patient/family education    OT Goals(Current goals can be found in the care plan section) Acute Rehab OT Goals Patient Stated Goal: unable to state OT Goal Formulation: With patient Time For Goal Achievement: 08/09/18 Potential to Achieve Goals: Good ADL Goals Pt Will Perform Grooming: with min assist;sitting Pt Will Perform Upper Body Dressing: with min assist;sitting Pt/caregiver will Perform Home Exercise Program: Right Upper extremity;With written HEP provided;With Supervision;Increased ROM Additional ADL Goal #1: Pt will sit unsupported x2 mins Additional ADL Goal #2: Pt will transfer with maxA Additional ADL Goal #3: Pt will perform visual tasks using BUE coordination in order to produce 75% accuracy with hand eye coordination  OT Frequency: Min 5X/week   Barriers to D/C: Decreased caregiver support          Co-evaluation              AM-PAC OT "6 Clicks" Daily Activity     Outcome Measure Help from another person eating meals?: Total Help from another person taking care of personal grooming?: Total Help from another person toileting, which includes using toliet, bedpan, or urinal?: Total Help from another person bathing (including washing, rinsing, drying)?: Total Help from another person to put on and taking off regular upper body clothing?: Total Help from another person to put on and taking off regular lower  body clothing?: Total 6 Click Score: 6   End of Session Nurse Communication: Mobility status  Activity Tolerance: Patient tolerated treatment well Patient left: in bed;with call bell/phone within reach  OT Visit Diagnosis: Unsteadiness on feet (R26.81);Muscle weakness (generalized) (M62.81);Hemiplegia  and hemiparesis;Other symptoms and signs involving cognitive function Hemiplegia - Right/Left: Right Hemiplegia - dominant/non-dominant: Dominant Hemiplegia - caused by: Cerebral infarction                Time: 1610-9604 OT Time Calculation (min): 34 min Charges:  OT General Charges $OT Visit: 1 Visit OT Evaluation $OT Eval Moderate Complexity: 1 Mod OT Treatments $Neuromuscular Re-education: 8-22 mins  Corey Li Acute Rehabilitation Services Pager: [phone] Office: [phone]   Corey Li 07/26/2018, 10:12 AM

## 2018-07-26 NOTE — Progress Notes (Addendum)
STROKE TEAM PROGRESS NOTE   INTERVAL HISTORY Patient transferred to floor today. Drowsy sleepy but arousable, severe dysarthria with intangible words. Still has right hemiparesis. Blood culture 1/2 showed staph coagulase neg.   Vitals:   07/26/18 0400 07/26/18 0500 07/26/18 0737 07/26/18 1212  BP: (!) 159/83  (!) 159/83 (!) 172/91  Pulse: 83  85 75  Resp:   17 17  Temp:    98 F (36.7 C)  TempSrc:    Axillary  SpO2: 95%  96% 95%  Weight:  71.7 kg    Height:        CBC:  Recent Labs  Lab 07/22/18 1234  07/25/18 0350 07/26/18 0549  WBC 18.2*   < > 14.2* 16.5*  NEUTROABS 15.3*  --   --  14.1*  HGB 10.7*   < > 9.3* 10.0*  HCT 35.8*   < > 27.2* 30.7*  MCV 104.1*   < > 91.6 91.6  PLT 482*   < > 290 329   < > = values in this interval not displayed.    Basic Metabolic Panel:  Recent Labs  Lab 07/24/18 0014 07/25/18 0350 07/26/18 0549  NA 140 140 136  K 3.6 3.6 3.7  CL 103 103 97*  CO2 GLUCOSE 166* 211* 114*  BUN 24* 21* 12  CREATININE 1.15 0.85 0.76  CALCIUM 8.4* 8.3* 9.4  MG 2.2  --  2.2  PHOS 3.1  3.2 1.6* 2.0*   Lipid Panel:     Component Value Date/Time   CHOL 148 07/24/2018 0014   TRIG 195 (H) 07/24/2018 0014   HDL 37 (L) 07/24/2018 0014   CHOLHDL 4.0 07/24/2018 0014   VLDL 39 07/24/2018 0014   LDLCALC 72 07/24/2018 0014   HgbA1c:  Lab Results  Component Value Date   HGBA1C 10.9 (H) 07/13/2018   Urine Drug Screen:     Component Value Date/Time   LABOPIA NONE DETECTED 07/22/2018 1234   COCAINSCRNUR NONE DETECTED 07/22/2018 1234   LABBENZ POSITIVE (A) 07/22/2018 1234   AMPHETMU NONE DETECTED 07/22/2018 1234   THCU NONE DETECTED 07/22/2018 1234   LABBARB NONE DETECTED 07/22/2018 1234    Alcohol Level     Component Value Date/Time   ETH <10 07/22/2018 1627    IMAGING Ct Angio Head W Or Wo Contrast  Result Date: 07/22/2018 CLINICAL DATA:  Altered mental status. Right-sided weakness. Recent cervical spine surgery. EXAM: CT  ANGIOGRAPHY HEAD AND NECK TECHNIQUE: Multidetector CT imaging of the head and neck was performed using the standard protocol during bolus administration of intravenous contrast. Multiplanar CT image reconstructions and MIPs were obtained to evaluate the vascular anatomy. Carotid stenosis measurements (when applicable) are obtained utilizing NASCET criteria, using the distal internal carotid diameter as the denominator. CONTRAST:  OMNIPAQUE IOHEXOL 350 MG/ML SOLN COMPARISON:  Head and cervical spine CTs 07/22/2018 FINDINGS: CTA NECK FINDINGS Aortic arch: Normal variant aortic arch branching pattern with common origin of the brachiocephalic and left common carotid arteries. Mild arch atherosclerosis. Widely patent arch vessel origins. Right carotid system: Patent without evidence of stenosis or dissection. Left carotid system: Patent without evidence of stenosis or dissection. Vertebral arteries: Patent without evidence of stenosis or dissection. Minimal calcified plaque at the right vertebral artery origin. Minimally dominant left vertebral artery. Skeleton: Postoperative changes from recent C5-C7 anterior and posterior fusion with C6 corpectomy as described on earlier cervical spine CT. Scattered postoperative stranding and gas in the soft tissues of the neck  including mild prevertebral edema and/or fluid as well as ventral epidural gas at C5-6 and gas in the posterior operative bed from C5-C7. Other neck: Endotracheal and enteric tubes present. Upper chest: Patchy peripheral ground-glass opacities in the included portions of both upper lobes, some of which are rounded. Review of the MIP images confirms the above findings CTA HEAD FINDINGS Anterior circulation: The internal carotid arteries are widely patent from skull base to carotid termini. ACAs and MCAs are patent without evidence of proximal branch occlusion or significant A1 or M1 stenosis. There is diffuse branch vessel irregular narrowing and distal  attenuation. No aneurysm is identified. Posterior circulation: The intracranial vertebral arteries are widely patent to the basilar. The basilar artery is widely patent a left PICA, right AICA, and bilateral SCAs are visualized but not well evaluated due to their small size. Posterior communicating arteries are not identified and may be diminutive or absent. The PCAs are patent with diffuse irregularity including multiple apparent severe P2 stenoses bilaterally. No aneurysm is identified. Venous sinuses: Patent. Anatomic variants: None. Delayed phase: Not performed. Review of the MIP images confirms the above findings IMPRESSION: 1. No emergent large vessel occlusion. 2. Widely patent cervical carotid and vertebral arteries. 3. Diffuse irregular narrowing of the small to medium-sized intracranial arteries. While this could be partly artifactual, a diffuse non-atherosclerotic process such as vasculitis is a concern. 4. Patchy ground-glass opacities in both lung apices, nonspecific. Atypical infection (including viral pneumonia) and noninfectious etiologies are possible. 5. Postoperative changes from recent cervical spine fusion as previously described. Postoperative infection is not excluded. These results were called by telephone at the time of interpretation on 07/22/2018 at 4:59 pm to Dr. Craige Cotta, who verbally acknowledged these results. Electronically Signed   By: Sebastian Ache M.D.   On: 07/22/2018 17:01   Ct Angio Neck W Or Wo Contrast  Result Date: 07/22/2018 CLINICAL DATA:  Altered mental status. Right-sided weakness. Recent cervical spine surgery. EXAM: CT ANGIOGRAPHY HEAD AND NECK TECHNIQUE: Multidetector CT imaging of the head and neck was performed using the standard protocol during bolus administration of intravenous contrast. Multiplanar CT image reconstructions and MIPs were obtained to evaluate the vascular anatomy. Carotid stenosis measurements (when applicable) are obtained utilizing NASCET criteria,  using the distal internal carotid diameter as the denominator. CONTRAST:  OMNIPAQUE IOHEXOL 350 MG/ML SOLN COMPARISON:  Head and cervical spine CTs 07/22/2018 FINDINGS: CTA NECK FINDINGS Aortic arch: Normal variant aortic arch branching pattern with common origin of the brachiocephalic and left common carotid arteries. Mild arch atherosclerosis. Widely patent arch vessel origins. Right carotid system: Patent without evidence of stenosis or dissection. Left carotid system: Patent without evidence of stenosis or dissection. Vertebral arteries: Patent without evidence of stenosis or dissection. Minimal calcified plaque at the right vertebral artery origin. Minimally dominant left vertebral artery. Skeleton: Postoperative changes from recent C5-C7 anterior and posterior fusion with C6 corpectomy as described on earlier cervical spine CT. Scattered postoperative stranding and gas in the soft tissues of the neck including mild prevertebral edema and/or fluid as well as ventral epidural gas at C5-6 and gas in the posterior operative bed from C5-C7. Other neck: Endotracheal and enteric tubes present. Upper chest: Patchy peripheral ground-glass opacities in the included portions of both upper lobes, some of which are rounded. Review of the MIP images confirms the above findings CTA HEAD FINDINGS Anterior circulation: The internal carotid arteries are widely patent from skull base to carotid termini. ACAs and MCAs are patent without evidence of  proximal branch occlusion or significant A1 or M1 stenosis. There is diffuse branch vessel irregular narrowing and distal attenuation. No aneurysm is identified. Posterior circulation: The intracranial vertebral arteries are widely patent to the basilar. The basilar artery is widely patent a left PICA, right AICA, and bilateral SCAs are visualized but not well evaluated due to their small size. Posterior communicating arteries are not identified and may be diminutive or absent.  The PCAs are patent with diffuse irregularity including multiple apparent severe P2 stenoses bilaterally. No aneurysm is identified. Venous sinuses: Patent. Anatomic variants: None. Delayed phase: Not performed. Review of the MIP images confirms the above findings IMPRESSION: 1. No emergent large vessel occlusion. 2. Widely patent cervical carotid and vertebral arteries. 3. Diffuse irregular narrowing of the small to medium-sized intracranial arteries. While this could be partly artifactual, a diffuse non-atherosclerotic process such as vasculitis is a concern. 4. Patchy ground-glass opacities in both lung apices, nonspecific. Atypical infection (including viral pneumonia) and noninfectious etiologies are possible. 5. Postoperative changes from recent cervical spine fusion as previously described. Postoperative infection is not excluded. These results were called by telephone at the time of interpretation on 07/22/2018 at 4:59 pm to Dr. Craige Cotta, who verbally acknowledged these results. Electronically Signed   By: Sebastian Ache M.D.   On: 07/22/2018 17:01   Ct Cervical Spine Wo Contrast  Result Date: 07/22/2018 CLINICAL DATA:  Recent neck surgery 07/16/2018. Neck pain with radiculopathy. EXAM: CT CERVICAL SPINE WITHOUT CONTRAST TECHNIQUE: Multidetector CT imaging of the cervical spine was performed without intravenous contrast. Multiplanar CT image reconstructions were also generated. COMPARISON:  CT cervical spine 07/17/2018, MRI 06/29/2018 FINDINGS: Alignment: Normal Image quality degraded by moderate motion. Skull base and vertebrae: Negative for fracture Soft tissues and spinal canal: Prevertebral soft tissue swelling is unchanged most prominent at C4 through C6 and measuring approximately 12 mm in thickness. This is likely edema and fluid related to recent anterior cervical fusion. Improvement in soft tissue gas in the left neck and prevertebral soft tissues related to recent anterior fusion. Spinal canal not well  evaluated due to streak artifact and patient motion Disc levels: C6 corpectomy with strut graft. Anterior plate fusion extending from C5 through C7. Posterior screw and rod fusion C5 through C7 bilaterally. Hardware in satisfactory position and unchanged from the prior CT. Upper chest: Lung apices clear bilaterally. Other: None IMPRESSION: Image quality degraded by moderate motion Corpectomy at C6. Anterior and posterior fusion from C5 through C7. Hardware in good position and unchanged from recent study. Postop prevertebral soft tissue swelling related to recent surgery and unchanged from the prior study. Electronically Signed   By: Marlan Palau M.D.   On: 07/22/2018 13:21   Ct Cervical Spine Wo Contrast  Result Date: 07/17/2018 CLINICAL DATA:  New left shoulder and arm pain after cervical decompression and fusion yesterday. EXAM: CT CERVICAL SPINE WITHOUT CONTRAST TECHNIQUE: Multidetector CT imaging of the cervical spine was performed without intravenous contrast. Multiplanar CT image reconstructions were also generated. COMPARISON:  Intraoperative cervical spine images from yesterday and 07/15/2018. MRI cervical spine dated 06/29/2018. FINDINGS: Alignment: Normal. Skull base and vertebrae: Postsurgical changes related to interval C6 corpectomy and C5-C7 anterior and posterior fusion. No evidence of hardware failure or loosening. The left C7 posterior screw traverses the upper margin of the pedicle but does not extend into the neural foramen. Bone graft material overlies the C5-C6 and C6-C7 facet joints. No acute fracture. No primary bone lesion or focal pathologic process. Soft tissues and  spinal canal: Expected postsurgical prevertebral soft tissue swelling and postsurgical changes in the left anterior neck and posterior midline. No discrete fluid collection. No definite canal hematoma, although there is limited evaluation of the canal at C5-C6 and C6-C7 due to streak artifact. Disc levels: C2-C3:   Negative. C3-C4:  Negative. C4-C5: Unchanged small central disc protrusion and mild left uncovertebral hypertrophy. Unchanged mild spinal canal stenosis. No neuroforaminal stenosis. C5-C6: Postsurgical changes. Limited evaluation of the spinal canal due to streak artifact. Minimal uncovertebral hypertrophy. No neuroforaminal stenosis. C6-C7: Postsurgical changes. Limited evaluation of the spinal canal due to streak artifact. No neuroforaminal stenosis. C7-T1:  Negative. Upper chest: Negative. Other: None. IMPRESSION: 1. Postsurgical changes related to interval C6 corpectomy and C5-C7 anterior and posterior fusion without acute postoperative complication. Streak artifact limits evaluation of the spinal canal at the surgical levels. 2. Unchanged mild spondylosis at C4-C5 with mild spinal canal stenosis. No significant neuroforaminal stenosis at any level. Electronically Signed   By: Obie Dredge M.D.   On: 07/17/2018 09:19   Mr Maxine Glenn Head Wo Contrast  Result Date: 07/23/2018 CLINICAL DATA:  Stroke follow-up.  Right-sided paralysis. EXAM: MRI HEAD WITHOUT AND WITH CONTRAST MRA HEAD WITHOUT CONTRAST TECHNIQUE: Multiplanar, multiecho pulse sequences of the brain and surrounding structures were obtained without and with intravenous contrast. Angiographic images of the head were obtained using MRA technique without contrast. CONTRAST:  8 mL Gadavist COMPARISON:  Brain MRI 06/29/2018 CTA head neck 07/22/2018 FINDINGS: MRI HEAD FINDINGS BRAIN: Multifocal bilateral acute ischemia scattered throughout both hemispheres and multiple vascular territories, including the right cerebellum. The largest areas of ischemia are in the posterior cerebral artery territories. 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 There is moderate edema within the affected areas. There is mild leptomeningeal contrast  enhancement within both PCA territories and within the left basal ganglia. The midline structures are normal. The white matter signal is normal for the patient's age. The CSF spaces are normal for age, with no hydrocephalus. Susceptibility-sensitive sequences show no chronic microhemorrhage or superficial siderosis. SKULL AND UPPER CERVICAL SPINE: The visualized skull base, calvarium, upper cervical spine and extracranial soft tissues are normal. SINUSES/ORBITS: No fluid levels or advanced mucosal thickening. No mastoid or middle ear effusion. The orbits are normal. MRA HEAD FINDINGS POSTERIOR CIRCULATION: --Basilar artery: Normal. --Posterior cerebral arteries: Patent proximally. Multifocal moderate-to-severe stenosis of the distal P2 segments. --Superior cerebellar arteries: Normal. --Inferior cerebellar arteries: Normal anterior and posterior inferior cerebellar arteries. ANTERIOR CIRCULATION: --Intracranial internal carotid arteries: Normal. --Anterior cerebral arteries: Normal. Both A1 segments are present. Patent anterior communicating artery. --Middle cerebral arteries: Normal. --Posterior communicating arteries: Absent or diminutive IMPRESSION: 1. Multifocal acute ischemia throughout multiple vascular territories in a pattern most consistent with emboli from a central cardiac or aortic source. The largest areas of ischemia are within the posterior cerebral artery territories. 2. No acute hemorrhage, herniation or hydrocephalus. 3. 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 4. No proximal intracranial arterial occlusion. Electronically Signed   By: Deatra Robinson M.D.   On: 07/23/2018 16:08   Mr Laqueta Jean ZO Contrast  Result Date: 07/23/2018 CLINICAL DATA:  Stroke follow-up.  Right-sided paralysis. EXAM: MRI HEAD WITHOUT AND WITH CONTRAST MRA HEAD WITHOUT CONTRAST TECHNIQUE: Multiplanar, multiecho pulse sequences of the brain and surrounding structures were obtained without and with  intravenous contrast. Angiographic images of the head were obtained using MRA  technique without contrast. CONTRAST:  8 mL Gadavist COMPARISON:  Brain MRI 06/29/2018 CTA head neck 07/22/2018 FINDINGS: MRI HEAD FINDINGS BRAIN: Multifocal bilateral acute ischemia scattered throughout both hemispheres and multiple vascular territories, including the right cerebellum. The largest areas of ischemia are in the posterior cerebral artery territories. 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 There is moderate edema within the affected areas. There is mild leptomeningeal contrast enhancement within both PCA territories and within the left basal ganglia. The midline structures are normal. The white matter signal is normal for the patient's age. The CSF spaces are normal for age, with no hydrocephalus. Susceptibility-sensitive sequences show no chronic microhemorrhage or superficial siderosis. SKULL AND UPPER CERVICAL SPINE: The visualized skull base, calvarium, upper cervical spine and extracranial soft tissues are normal. SINUSES/ORBITS: No fluid levels or advanced mucosal thickening. No mastoid or middle ear effusion. The orbits are normal. MRA HEAD FINDINGS POSTERIOR CIRCULATION: --Basilar artery: Normal. --Posterior cerebral arteries: Patent proximally. Multifocal moderate-to-severe stenosis of the distal P2 segments. --Superior cerebellar arteries: Normal. --Inferior cerebellar arteries: Normal anterior and posterior inferior cerebellar arteries. ANTERIOR CIRCULATION: --Intracranial internal carotid arteries: Normal. --Anterior cerebral arteries: Normal. Both A1 segments are present. Patent anterior communicating artery. --Middle cerebral arteries: Normal. --Posterior communicating arteries: Absent or diminutive IMPRESSION: 1. Multifocal acute ischemia throughout multiple vascular territories in a  pattern most consistent with emboli from a central cardiac or aortic source. The largest areas of ischemia are within the posterior cerebral artery territories. 2. No acute hemorrhage, herniation or hydrocephalus. 3. 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 4. No proximal intracranial arterial occlusion. Electronically Signed   By: Deatra Robinson M.D.   On: 07/23/2018 16:08   Dg Chest Port 1 View  Result Date: 07/26/2018 CLINICAL DATA:  Extubated EXAM: PORTABLE CHEST 1 VIEW COMPARISON:  Chest radiograph from one day prior. FINDINGS: Interval extubation and removal of enteric tube. Partially visualized surgical hardware overlying the lower cervical spine. Stable cardiomediastinal silhouette with normal heart size. No pneumothorax. No pleural effusion. No pulmonary edema. Mild hazy and reticular opacities at the lung bases, left greater than right, not appreciably changed. IMPRESSION: Interval extubation and removal of enteric tube. Stable mild hazy and reticular opacities at the lung bases, left greater than right. Electronically Signed   By: Delbert Phenix M.D.   On: 07/26/2018 08:34   Ct Head Code Stroke Wo Contrast`  Result Date: 07/22/2018 CLINICAL DATA:  Code stroke.  Altered level of consciousness. EXAM: CT HEAD WITHOUT CONTRAST TECHNIQUE: Contiguous axial images were obtained from the base of the skull through the vertex without intravenous contrast. COMPARISON:  MRI head 06/29/2018, CT head 05/07/2018 FINDINGS: Brain: Ventricle size normal. Negative for acute infarct, hemorrhage, mass. No edema or midline shift. Vascular: Negative for hyperdense vessel Skull: Negative Sinuses/Orbits: Negative Other: None ASPECTS ([place] Stroke Program Early CT Score) - Ganglionic level infarction (caudate, lentiform nuclei, internal capsule, insula, M1-M3 cortex): 7 - Supraganglionic infarction (M4-M6 cortex): 3 Total score (0-10 with 10 being normal): 10 IMPRESSION: 1. No acute abnormality 2. ASPECTS  is 10 Electronically Signed   By: Marlan Palau M.D.   On: 07/22/2018 13:00   Vas [place] Lower Extremity Venous (dvt)  Result Date: 07/24/2018  Lower Venous Study Indications: Stroke.  Performing Technologist: Blanch Media RVS  Examination Guidelines: A complete evaluation includes B-mode imaging, spectral Doppler, color Doppler, and power Doppler as needed  of all accessible portions of each vessel. Bilateral testing is considered an integral part of a complete examination. Limited examinations for reoccurring indications may be performed as noted.  Right Venous Findings: +---------+---------------+---------+-----------+----------+-------+          CompressibilityPhasicitySpontaneityPropertiesSummary +---------+---------------+---------+-----------+----------+-------+ CFV      Full           Yes      Yes                          +---------+---------------+---------+-----------+----------+-------+ SFJ      Full                                                 +---------+---------------+---------+-----------+----------+-------+ FV Prox  Full                                                 +---------+---------------+---------+-----------+----------+-------+ FV Mid   Full                                                 +---------+---------------+---------+-----------+----------+-------+ FV DistalFull                                                 +---------+---------------+---------+-----------+----------+-------+ PFV      Full                                                 +---------+---------------+---------+-----------+----------+-------+ POP      Full           Yes      Yes                          +---------+---------------+---------+-----------+----------+-------+ PTV      Full                                                 +---------+---------------+---------+-----------+----------+-------+ PERO     Full                                                  +---------+---------------+---------+-----------+----------+-------+  Left Venous Findings: +---------+---------------+---------+-----------+----------+-------+          CompressibilityPhasicitySpontaneityPropertiesSummary +---------+---------------+---------+-----------+----------+-------+ CFV      Full           Yes      Yes                          +---------+---------------+---------+-----------+----------+-------+ SFJ      Full                                                 +---------+---------------+---------+-----------+----------+-------+  FV Prox  Full                                                 +---------+---------------+---------+-----------+----------+-------+ FV Mid   Full                                                 +---------+---------------+---------+-----------+----------+-------+ FV DistalFull                                                 +---------+---------------+---------+-----------+----------+-------+ PFV      Full                                                 +---------+---------------+---------+-----------+----------+-------+ POP      Full           Yes      Yes                          +---------+---------------+---------+-----------+----------+-------+ PTV      Full                                                 +---------+---------------+---------+-----------+----------+-------+ PERO     Full                                                 +---------+---------------+---------+-----------+----------+-------+    Summary: Right: There is no evidence of deep vein thrombosis in the lower extremity. No cystic structure found in the popliteal fossa. Left: There is no evidence of deep vein thrombosis in the lower extremity. No cystic structure found in the popliteal fossa.  *See table(s) above for measurements and observations. Electronically signed by Fabienne Bruns MD on 07/24/2018 at 6:57:15 PM.    Final     2D Echocardiogram w/ bubble  1. The left ventricle has low normal systolic function, with an ejection fraction of 50-55%. The cavity size was normal. Left ventricular diastolic parameters were normal.  2. The right ventricle has normal systolc function. The cavity was normal. There is no increase in right ventricular wall thickness.  3. Negative bubble study for right to left shunting.  4. The aortic valve was not well visualized Aortic valve regurgitation is trivial by color flow Doppler.   PHYSICAL EXAM:  Temp:  [98 F (36.7 C)-98.9 F (37.2 C)] 98 F (36.7 C) (04/12 1212) Pulse Rate:  [70-85] 75 (04/12 1212) Resp:  [11-22] 17 (04/12 1212) BP: (141-172)/(83-111) 172/91 (04/12 1212) SpO2:  [95 %-100 %] 95 % (04/12 1212) Weight:  [71.7 kg] 71.7 kg (04/12 0500)  General - Well nourished, well developed, drowsy sleepy.  Ophthalmologic - fundi not visualized due  to noncooperation.  Cardiovascular - Regular rhythm and rate  Neuro - drowsy sleepy but arousable, orientated to self but severfe dysarthria with intangible words. Not cooperative on naming or repetition. Paucity of speech and did not follow simple commands due to drowsiness. PERRL, eyes in mid position, not blinking to visual threat bilaterally, not cooperative with eye movement on request. Right facial droop.  Tongue midline in mouth. LUE and LLE spontaneous movement against gravity, however, RUE flaccid and RLE 2/5 with pain stimulation. triple reflex present on the right LE. DTR 1+ and right babinski. Sensation, coordination not cooperative and gait not tested.   ASSESSMENT/PLAN Mr. Corey Li is a 48 y.o. male with history of thyroid disease, sleep apnea, PTSD, neuropathy, hypertension, hyperlipidemia, diabetes with recent ACDF at the level of C5-C7 who lives in a hotel presenting on POD #6 with confusion that progressed to obtundation and rhonchorous respirations in the ED.   Stroke:  bilateral ischemic infarcts in  various territories, largest in bilateral PCA territory, but also including left AchA, punctate bilateral MCA and bilateral ACA infarcts, embolic pattern consistent with cardio embolic source. Occult afib vs. hypercoagulable state with DKA  Code Stroke CT head No acute stroke.     CT CS corpectomy at C6. Anterior and posterior fusion C5-C7. Hardware in position. Postop prevertebral soft tissue swelling  CTA head & neck no ELVO. Diffuse irreg narrowing sm and med vessels. patcy ground glass opacities B lung apices. 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   2D Echo EF 50-55%. No source of embolus, negative bubble study  LE doppler neg DVT  Blood cultures seem to be contamination, low suspicious for endocarditis given no fever  Recommend 30 day cardiac event monitoring as outpt to rule out afib first. If negative, may consider loop recorder placement.  LDL 72  HgbA1c 10.9  UDS neg  Lovenox 40 mg sq daily for VTE prophylaxis  No antithrombotic prior to admission, now on ASA. Continue on discharge.  Therapy recommendations:  pending   Disposition:  pending   Acute Respiratory Failure Aspiration PNA  Extubated 07/25/18  Now on room air, tolerating well  CTA neck b/l apices patchy ground glass opacities  Repeat CXR stable mild hazy and reticular opacities at the lung base, L>R  Low probability for COVID  DKA w/ Diabetes type II  HgbA1c 10.9, goal < 7.0  uncontrolled  Hyperglycemia improved  Elevated Beta-hydroxybutyric acid  Now on levemir  Management per primary team  Positive blood culture, likely contamination  RVP neg  Blood culture 1/2 G+ staph but coagulase negative   Sputum culture - MODERATE GROUP B STREP(S.AGALACTIAE)  CCM on board  On rocephin  WBCs -  18.2->21.6->14.2->16.5  Hypertension  Stable on the high end . On amlodipine  . Add lisinopril 20 . Long-term BP goal normotensive  Hyperlipidemia  Home meds:  lipitor 80  LDL 72, goal < 70  Resume lipitor 80  Continue statin at discharge  Tobacco abuse  Current smoker  Smoking cessation counseling will be provided  Other Stroke Risk Factors  Hx ETOH use  Obstructive sleep apnea  Other Active Problems  Recent cervical decompression and fusion at the level of C5-C7 w/ L shoulder and arm pain (Dumonski)  AKI Cre 3.00->2.67->1.95->1.74->1.15->0.85->0.76  Hospital day # 4  Neurology will sign off.  Please call with questions. Pt will follow up with stroke clinic [name] at [facility] in about 4 weeks. Thanks for the consult.   Marvel Plan, MD PhD Stroke Neurology 07/26/2018 1:02 PM   To contact Stroke Continuity provider, please refer to [email or web address]. After hours, contact General Neurology

## 2018-07-26 NOTE — Evaluation (Signed)
Clinical/Bedside Swallow Evaluation Patient Details  Name: Corey Li MRN: [id number] Date of Birth: 01-29-1971  Today's Date: 07/26/2018 Time: SLP Start Time (ACUTE ONLY): 1134 SLP Stop Time (ACUTE ONLY): 1148 SLP Time Calculation (min) (ACUTE ONLY): 14 min  Past Medical History:  Past Medical History:  Diagnosis Date  . Anxiety   . Depression   . Diabetes mellitus without complication (HCC)    Type II  . GERD (gastroesophageal reflux disease)   . H/O seasonal allergies   . Headache   . Hyperlipidemia   . Hypertension   . Hypothyroidism   . Neuropathy   . Numbness and tingling of both lower extremities   . PTSD (post-traumatic stress disorder)   . Sleep apnea   . Thyroid disease    Past Surgical History:  Past Surgical History:  Procedure Laterality Date  . ANTERIOR CERVICAL DECOMP/DISCECTOMY FUSION N/A 07/15/2018   Procedure: ANTERIOR CERVICAL DECOMPRESSION/DISCECTOMY FUSION TWO LEVELS WITH CERVICAL SIX CORPECTOMY;  Surgeon: Estill Bambergumonski, Mark, MD;  Location: [facility] OR;  Service: Orthopedics;  Laterality: N/A;  . HERNIA REPAIR     child  . POSTERIOR CERVICAL FUSION/FORAMINOTOMY  07/16/2018   Procedure: Posterior Cervical Fusion/Foraminotomy Level 2;  Surgeon: Estill Bambergumonski, Mark, MD;  Location: [facility] OR;  Service: Orthopedics;;   HPI:  Pt is a 48 yo male s/p anterior/posterior CDF 4/1. Presented from hotel room with AMS, hypoxia and right sided weakness. Intubated 4/8-4/11. MRI multifocal bilateral infarcts, right cerebellum, posterior cerebral artery, left basal gangles. Also fouind to have bacteremia, sepsis and DKA. Pt PMH: DM, anxiety, depression, HTN. CXR interval extubation and removal of enteric tube. Stable mild hazy and reticular opacities at the lung bases, left greater than right.   Assessment / Plan / Recommendation Clinical Impression  Pt exhibits multiple aspiration risk factors including anterior/posterior cervical discectomy fusion 4/1, multiple bilateral acute CVA's and poor  positioning. At baseline pt's neck is in significant extension and unable to flex neck and excessive pain using pillow. Copious mucous cleaned from side of face, neck and oral decontamination provided. Vocal quality is wet with low intensity at baseline with inability to cough. He manipulated ice chip with imprecision and mild delays in transit. No significant difference in vocal quality post ice trials however po's are not recommend at bedside given CVA and recent cervical surgery increasing aspiration risk. He will need instrumentation with MBS to fully assess swallow ability. Continue oral care and ST can attempt MBS when appropriate.      SLP Visit Diagnosis: Dysphagia, unspecified (R13.10)    Aspiration Risk  Moderate aspiration risk;Severe aspiration risk    Diet Recommendation NPO;Ice chips PRN after oral care   Medication Administration: Via alternative means    Other  Recommendations Oral Care Recommendations: Oral care QID   Follow up Recommendations Skilled Nursing facility      Frequency and Duration min 2x/week  2 weeks       Prognosis Prognosis for Safe Diet Advancement: Good      Swallow Study   General HPI: Pt is a 48 yo male s/p anterior/posterior CDF 4/1. Presented from hotel room with AMS, hypoxia and right sided weakness. Intubated 4/8-4/11. MRI multifocal bilateral infarcts, right cerebellum, posterior cerebral artery, left basal gangles. Also fouind to have bacteremia, sepsis and DKA. Pt PMH: DM, anxiety, depression, HTN. CXR interval extubation and removal of enteric tube. Stable mild hazy and reticular opacities at the lung bases, left greater than right. Type of Study: Bedside Swallow Evaluation Previous Swallow Assessment: none Diet  Prior to this Study: NPO Temperature Spikes Noted: No Respiratory Status: Nasal cannula History of Recent Intubation: Yes Length of Intubations (days): 4 days Date extubated: 07/25/18 Behavior/Cognition: Distractible;Requires  cueing;Alert;Cooperative Oral Cavity Assessment: Dry Oral Care Completed by SLP: Yes Oral Cavity - Dentition: Poor condition;Missing dentition Vision: Functional for self-feeding Self-Feeding Abilities: Needs set up;Needs assist Patient Positioning: Upright in bed Baseline Vocal Quality: Wet;Low vocal intensity Volitional Cough: Other (Comment)(no response) Volitional Swallow: Unable to elicit    Oral/Motor/Sensory Function Overall Oral Motor/Sensory Function: Mild impairment Facial ROM: Reduced right;Suspected CN VII (facial) dysfunction Facial Symmetry: Abnormal symmetry right;Suspected CN VII (facial) dysfunction Facial Strength: Reduced right;Suspected CN VII (facial) dysfunction Lingual ROM: Reduced right;Reduced left Lingual Strength: Reduced;Suspected CN XII (hypoglossal) dysfunction   Ice Chips Ice chips: Impaired Presentation: Spoon Oral Phase Impairments: Reduced labial seal;Reduced lingual movement/coordination Oral Phase Functional Implications: Prolonged oral transit Pharyngeal Phase Impairments: Decreased hyoid-laryngeal movement;Wet Vocal Quality   Thin Liquid Thin Liquid: Not tested    Nectar Thick Nectar Thick Liquid: Not tested   Honey Thick Honey Thick Liquid: Not tested   Puree Puree: Not tested   Solid     Solid: Not tested      [name] 07/26/2018,12:13 PM  [name] [name].Ed Nurse, children's [phone] Office [phone]

## 2018-07-26 NOTE — Evaluation (Signed)
Physical Therapy Evaluation Patient Details Name: Corey Li MRN: [id number] DOB: 08/05/1970 Today's Date: 07/26/2018   History of Present Illness  Pt is a 48 yo male s/p multifocal infarcts, bacteremia, sepsis and DKA. Pt PMHx: 4/1 A/P CDF C5-7, DM, anxiety, depression, HTN.    Clinical Impression  Pt admitted with above diagnosis. Pt currently with functional limitations due to the deficits listed below (see PT Problem List). Pt lethargic and sleeping upon entry. unable to participate  with therapy, but communicate that he had soiled the bed. 2 person assist for rolling in bed for pericare. At this time unable to tolerate CIR but if patient progresses will update recs. R side flaccidity noted. Pt will benefit from skilled PT to increase their independence and safety with mobility to allow discharge to the venue listed below.       Follow Up Recommendations SNF    Equipment Recommendations       Recommendations for Other Services       Precautions / Restrictions Precautions Precautions: Cervical;Fall;Other (comment) Precaution Comments: went over precautions for recent C spine sx; foley catheter, mitts for restraints on BUE hands. Restrictions Weight Bearing Restrictions: No      Mobility  Bed Mobility Overal bed mobility: Needs Assistance   Rolling: Total assist            Transfers Overall transfer level: Needs assistance               General transfer comment: too lethargic too paticipate  Ambulation/Gait                Stairs            Wheelchair Mobility    Modified Rankin (Stroke Patients Only)       Balance Overall balance assessment: Needs assistance Sitting-balance support: Single extremity supported;Feet supported Sitting balance-Leahy Scale: Zero                                       Pertinent Vitals/Pain Pain Assessment: Faces Faces Pain Scale: Hurts a little bit Pain Location: "all over" Pain  Descriptors / Indicators: Aching Pain Intervention(s): Monitored during session    Home Living Family/patient expects to be discharged to:: Skilled nursing facility                      Prior Function Level of Independence: Independent         Comments: recent back surgery; lives in hotel,  was working recently in [employer].     Hand Dominance   Dominant Hand: Right(now flaccid)    Extremity/Trunk Assessment   Upper Extremity Assessment RUE Deficits / Details: FLACCID; in mitt restraints RUE Sensation: WNL RUE Coordination: decreased fine motor;decreased gross motor LUE Deficits / Details: WFLs in mitt restraints LUE Sensation: WNL LUE Coordination: decreased fine motor;decreased gross motor    Lower Extremity Assessment RLE Deficits / Details: FLACCID    Cervical / Trunk Assessment Cervical / Trunk Assessment: Kyphotic  Communication   Communication: Expressive difficulties  Cognition Arousal/Alertness: Lethargic;Suspect due to medications Behavior During Therapy: Agitated;Flat affect;Restless Overall Cognitive Status: Impaired/Different from baseline Area of Impairment: Attention;Following commands;Safety/judgement;Awareness                               General Comments: pt following some commands- difficult to understand and unaware of deficits  General Comments      Exercises Other Exercises Other Exercises: RUE PROM Shoulder through digits, 10 reps, 1 set. Other Exercises: LUE AAROM with some resistance due to agitation for shoulder through hand ROM.   Assessment/Plan    PT Assessment Patient needs continued PT services  PT Problem List         PT Treatment Interventions DME instruction;Gait training;Stair training;Functional mobility training;Therapeutic activities;Patient/family education    PT Goals (Current goals can be found in the Care Plan section)  Acute Rehab PT Goals Patient Stated Goal: unable to state     Frequency Min 4X/week   Barriers to discharge        Co-evaluation               AM-PAC PT "6 Clicks" Mobility  Outcome Measure Help needed turning from your back to your side while in a flat bed without using bedrails?: Total Help needed moving from lying on your back to sitting on the side of a flat bed without using bedrails?: Total Help needed moving to and from a bed to a chair (including a wheelchair)?: Total Help needed standing up from a chair using your arms (e.g., wheelchair or bedside chair)?: Total Help needed to walk in hospital room?: Total Help needed climbing 3-5 steps with a railing? : Total 6 Click Score: 6    End of Session   Activity Tolerance: Patient tolerated treatment well Patient left: in chair;with call bell/phone within reach Nurse Communication: Mobility status PT Visit Diagnosis: Other abnormalities of gait and mobility (R26.89);Pain    Time: 1310-1330 PT Time Calculation (min) (ACUTE ONLY): 20 min   Charges:   PT Evaluation $PT Eval Moderate Complexity: 1 Mod         Etta Grandchild, PT, DPT Acute Rehabilitation Services Pager: [phone] Office: [phone]   Etta Grandchild 07/26/2018, 1:46 PM

## 2018-07-27 ENCOUNTER — Other Ambulatory Visit: Payer: Self-pay | Admitting: Physician Assistant

## 2018-07-27 ENCOUNTER — Encounter (HOSPITAL_COMMUNITY): Payer: Self-pay | Admitting: Orthopedic Surgery

## 2018-07-27 DIAGNOSIS — I4891 Unspecified atrial fibrillation: Secondary | ICD-10-CM

## 2018-07-27 DIAGNOSIS — E785 Hyperlipidemia, unspecified: Secondary | ICD-10-CM | POA: Diagnosis not present

## 2018-07-27 DIAGNOSIS — I639 Cerebral infarction, unspecified: Secondary | ICD-10-CM

## 2018-07-27 LAB — CBC WITH DIFFERENTIAL/PLATELET
Abs Immature Granulocytes: 0.17 10*3/uL — ABNORMAL HIGH (ref 0.00–0.07)
Basophils Absolute: 0.1 10*3/uL (ref 0.0–0.1)
Basophils Relative: 1 %
Eosinophils Absolute: 0.3 10*3/uL (ref 0.0–0.5)
Eosinophils Relative: 2 %
HCT: 31.2 % — ABNORMAL LOW (ref 39.0–52.0)
Hemoglobin: 10.4 g/dL — ABNORMAL LOW (ref 13.0–17.0)
Immature Granulocytes: 1 %
Lymphocytes Relative: 7 %
Lymphs Abs: 1 10*3/uL (ref 0.7–4.0)
MCH: 30.1 pg (ref 26.0–34.0)
MCHC: 33.3 g/dL (ref 30.0–36.0)
MCV: 90.4 fL (ref 80.0–100.0)
Monocytes Absolute: 0.9 10*3/uL (ref 0.1–1.0)
Monocytes Relative: 7 %
Neutro Abs: 11.9 10*3/uL — ABNORMAL HIGH (ref 1.7–7.7)
Neutrophils Relative %: 82 %
Platelets: 405 10*3/uL — ABNORMAL HIGH (ref 150–400)
RBC: 3.45 MIL/uL — ABNORMAL LOW (ref 4.22–5.81)
RDW: 13.9 % (ref 11.5–15.5)
WBC: 14.4 10*3/uL — ABNORMAL HIGH (ref 4.0–10.5)
nRBC: 0 % (ref 0.0–0.2)

## 2018-07-27 LAB — MAGNESIUM: Magnesium: 2.2 mg/dL (ref 1.7–2.4)

## 2018-07-27 LAB — GLUCOSE, CAPILLARY
Glucose-Capillary: 165 mg/dL — ABNORMAL HIGH (ref 70–99)
Glucose-Capillary: 169 mg/dL — ABNORMAL HIGH (ref 70–99)
Glucose-Capillary: 175 mg/dL — ABNORMAL HIGH (ref 70–99)
Glucose-Capillary: 219 mg/dL — ABNORMAL HIGH (ref 70–99)
Glucose-Capillary: 231 mg/dL — ABNORMAL HIGH (ref 70–99)
Glucose-Capillary: 74 mg/dL (ref 70–99)
Glucose-Capillary: 85 mg/dL (ref 70–99)
Glucose-Capillary: 92 mg/dL (ref 70–99)

## 2018-07-27 LAB — PHOSPHORUS: Phosphorus: 2.2 mg/dL — ABNORMAL LOW (ref 2.5–4.6)

## 2018-07-27 LAB — CULTURE, BLOOD (ROUTINE X 2)
Culture: NO GROWTH
Special Requests: ADEQUATE

## 2018-07-27 MED ORDER — SODIUM CHLORIDE 0.9 % IV SOLN
1.5000 g | Freq: Four times a day (QID) | INTRAVENOUS | Status: AC
  Administered 2018-07-27 – 2018-07-29 (×8): 1.5 g via INTRAVENOUS
  Filled 2018-07-27 (×9): qty 1.5

## 2018-07-27 MED ORDER — GLUCERNA 1.2 CAL PO LIQD
480.0000 mL | Freq: Three times a day (TID) | ORAL | Status: DC
  Administered 2018-07-27: 120 mL
  Administered 2018-07-27: 474 mL
  Administered 2018-07-28 – 2018-07-30 (×7): 480 mL
  Administered 2018-07-31: 120 mL
  Administered 2018-07-31: 21:00:00 360 mL
  Administered 2018-07-31: 240 mL
  Administered 2018-08-01 (×3): 480 mL
  Filled 2018-07-27 (×3): qty 1000
  Filled 2018-07-27: qty 711
  Filled 2018-07-27: qty 1000
  Filled 2018-07-27: qty 711
  Filled 2018-07-27 (×5): qty 1000
  Filled 2018-07-27: qty 474
  Filled 2018-07-27 (×2): qty 1000
  Filled 2018-07-27 (×2): qty 711
  Filled 2018-07-27: qty 1000
  Filled 2018-07-27: qty 711
  Filled 2018-07-27 (×2): qty 1000

## 2018-07-27 MED ORDER — PRO-STAT SUGAR FREE PO LIQD
30.0000 mL | Freq: Two times a day (BID) | ORAL | Status: DC
  Administered 2018-07-27 – 2018-08-02 (×11): 30 mL
  Filled 2018-07-27 (×11): qty 30

## 2018-07-27 MED ORDER — K PHOS MONO-SOD PHOS DI & MONO 155-852-130 MG PO TABS
500.0000 mg | ORAL_TABLET | Freq: Two times a day (BID) | ORAL | Status: AC
  Administered 2018-07-27 (×2): 500 mg via ORAL
  Filled 2018-07-27 (×2): qty 2

## 2018-07-27 NOTE — Progress Notes (Signed)
    Patient is now out of the ICU C/o low back pain   Physical Exam: Vitals:   07/27/18 0732 07/27/18 0748  BP:  (!) 162/92  Pulse:  82  Resp:  14  Temp:  97.9 F (36.6 C)  SpO2: 97% 99%   Patient disarthric and is not tracking visually No movement of right UE or LE,  + movement at left UE and LE, as previously documented   Anterior and posterior incisions are healing well - no erythema or drainage or warmth Posterior nylon stitch is in place  Pt s/p A/P cervical decompression and fusion that did unfortunately sustain a stroke postoperatively, currently with significant functional deficits. The origin of his emboli leading to his stroke is currently unknown, after extensive workup.  I did speak extensively with Dr. Roda Shutters, his neurologist, yesterday. He seems to think that it is possible that he was in a hypercoagulable state at the time of his diabetic ketoacidosis, and that this resulted in emboli, resulting in his stroke.  His workup for this will continue however.  I will continue to follow his hospital course. Certainly, he is going to need extensive rehabilitation, and ongoing close medical surveillance, particular from the standpoint of his uncontrolled diabetes.  From the standpoint of his cervical spine surgery, he is doing well.  As previously documented, his CAT scan does reveal appropriate positioning of his instrumentation.  - patient will need extensive physical therapy and occupational therapy, with likely transfer to a skilled nursing facility, but this will depend on the recommendations from his physical therapist and occupational therapist and speech therapist.  - Patient's nylon states she will be maintained.  Will need to be removed in approximately 1 week.  He does need his soft collar to be reapplied, and I did specifically expressed this to his nurse this morning.  She will arrange him getting a soft cervical collar.  - patient will continue to need close surveillance  from a medical standpoint, due to his extensive medical history, particularly his diabetes.

## 2018-07-27 NOTE — Procedures (Signed)
Cortrak  Person Inserting Tube:  Kendell Bane C, RD Tube Type:  Cortrak - 43 inches Tube Location:  Right nare Initial Placement:  Stomach Secured by: Bridle Technique Used to Measure Tube Placement:  Documented cm marking at nare/ corner of mouth Cortrak Secured At:  73 cm    Cortrak Tube Team Note:  Consult received to place a Cortrak feeding tube.   No x-ray is required. RN may begin using tube.   If the tube becomes dislodged please keep the tube and contact the Cortrak team at [email or web address] ([email or web address]) for replacement.  If after hours and replacement cannot be delayed, place a NG tube and confirm placement with an abdominal x-ray.    Kendell Bane RD, LDN, CNSC [phone] Pager [phone] After Hours Pager

## 2018-07-27 NOTE — Progress Notes (Signed)
Inpatient Diabetes Program Recommendations  AACE/ADA: New Consensus Statement on Inpatient Glycemic Control (2015)  Target Ranges:  Prepandial:   less than 140 mg/dL      Peak postprandial:   less than 180 mg/dL (1-2 hours)      Critically ill patients:  140 - 180 mg/dL   Lab Results  Component Value Date   GLUCAP 219 (H) 07/27/2018   HGBA1C 10.9 (H) 07/13/2018    Review of Glycemic Control  Diabetes history: DM 2 Outpatient Diabetes medications: NPH 47 units qam, 36 units qpm, Regular insulin 27 units in am, 17 units qpm, Metformin 1000 mg BID Current orders for Inpatient glycemic control: Levemir 18 units Q12 hours, Novolog 3-9 units Q4 hours  Inpatient Diabetes Program Recommendations:    Hypoglycemia of 52 mg/dl at 3875 last night. Novolog correction scale given within 2 hours of each other. Noted Glucose 219 this am. Will watch patient on current regimen (which has controlled glucose trends the past few days).  Thanks,  Christena Deem RN, MSN, BC-ADM Inpatient Diabetes Coordinator Team Pager [phone] (8a-5p)

## 2018-07-27 NOTE — Progress Notes (Signed)
Nutrition Follow-up   RD working remotely.  DOCUMENTATION CODES:   Not applicable  INTERVENTION:  Transition to bolus tube feeds: Using Glucerna 1.2 formula via Cortrak NGT start bolus at 120 ml (half carton/ARC) and advance by 120 ml each feeding to goal bolus volume of 480 ml (2 cartons/ARCs) given TID.   Provide 30 ml Prostat BID per tube.   Tube feeding regimen to provide 1928 kcal (100% of needs), 116 grams of protein, and 1166 ml free water.   Once IV fluids are discontinued, recommend free water flushes of 200 ml given TID between boluses.  NUTRITION DIAGNOSIS:   Inadequate oral intake related to acute illness as evidenced by NPO status; ongoing  GOAL:   Patient will meet greater than or equal to 90% of their needs; progressing  MONITOR:   TF tolerance, Diet advancement, Labs, Skin, Weight trends, I & O's  REASON FOR ASSESSMENT:   Ventilator    ASSESSMENT:   48 yo male admitted with DKA, acute encephalopathy with right sided weakness, acute respiratory failure requiring intubation. PMH includes DM, HTN, HLD, GERD. MRI of the brain showed multi-ischemic insults in multiple vascular territories.    Pt extubated 4/11. Pt continues on NPO status due to high aspiration risk. Cortrak NGT placed this AM. Tip of tube in stomach. Plans to transition to bolus tube feeds using cartons/ARCs as currently there is increasing nation wide shortages of 1 liter bottles of tube feeding formulas. RD to continue to monitor for enteral nutrition tolerance.   Labs and medications reviewed.   Diet Order:   Diet Order            Diet NPO time specified  Diet effective now              EDUCATION NEEDS:   Not appropriate for education at this time  Skin:  Skin Assessment: Reviewed RN Assessment  Last BM:  4/12  Height:   Ht Readings from Last 1 Encounters:  07/22/18 5\' 9"  (1.753 m)    Weight:   Wt Readings from Last 1 Encounters:  07/27/18 71.9 kg    Ideal Body  Weight:  72.7 kg  BMI:  Body mass index is 23.41 kg/m.  Estimated Nutritional Needs:   Kcal:  1900-2100  Protein:  90-110 grams  Fluid:  1.9 - 2.1 L/day    Roslyn Smiling, MS, RD, LDN Pager # [phone] After hours/ weekend pager # [phone]

## 2018-07-27 NOTE — Progress Notes (Addendum)
[facility] PROGRESS NOTE    Progress Note  Corey Li  ZOX:[id number] DOB: Jun 14, 1970 DOA: 07/22/2018 PCP: [name], NP     Brief Narrative:   Corey Li is an 48 y.o. male past medical history diabetes mellitus type 2 status post recent laminectomy on 07/16/2018 presenting with altered mental status and hypoxia with some right-sided weakness to the ED code stroke was called.  Assessment/Plan:  B/lA bilateral ischemic infarcts largest bilateral PCA territory including the left Acha, bilateral punctuate MCA and bilateral ACA infarcts likely embolic from cardioembolic source versus hypercoagulable state with DKA: MRI of the brain showed multi-ischemic insults in multiple vascular territories largest in the PCA territory but including the left Acha, bilateral MCA infarcts. 2D echo showed an EF of 50% negative bubble study. Lower extremity Doppler was negative. LDL was 72 hemoglobin A1c was 10.9 urine drug screen was negative. Cont high dose statins. Now on aspirin 325, which she will continue as an outpatient. Neurology was consulted who recommended a cardiac event monitor versus loop recorder to rule out A. Fib. Swallowing evaluation was performed and recommended that n.p.o. due to the patient being high risk for aspiration. We will discuss with neurology and place a feeding tube. Physical Therapy evaluated the patient and recommended CIR.  Acute respiratory failure with hypoxia possibly due to aspiration pneumonia: Patient extubated on 07/25/2018 transferred to [facility] on 07/26/2018. He is low probability for COVID-19. A CTA of the head and neck showed bilateral apices groundglass opacity, there was a concern of mucous plugging on 07/23/2018. Likely due to aspiration pneumonia change antibiotic coverage to IV Unasyn. Chest x-ray mild opacity at the bases  DKA/diabetes mellitus type 2: Now off the insulin drip bicarb greater than 20 anion gap is closed. A1c of  10.9. Only on Levemir and sliding scale insulin with good control.  Will need to follow-up with PCP as an outpatient.  Acute kidney injury: Likely prerenal azotemia resolved with IV fluid hydration creatinine now at baseline.  Acute metabolic encephalopathy/acute confusional state: EKG performed showed a QTC of 477. Started on Haldol IV as needed. Check a 12-lead EKG tomorrow morning. 1 out of 2 blood culture positive for staph coag negative staph. RSVP negative.  Essential hypertension: Continue amlodipine continue to monitor blood pressure.  Hyperlipidemia: Continue Lipitor, LDL greater than 80 Continue statins discharge.  Tobacco abuse  Current smoker.  Hypophosphatemia: Replete foot phosphorus orally, once the feeding tube is placed.  Disposition: Seems to be a difficult situation as patient does not have kids he is not married, his parents are both deceased, he has 2 siblings which he is estranged to, have spoken to a friend who is the emergency contact in the computer, she lives in [place]). Consult to Child psychotherapist for skilled nursing facility placement.  DVT prophylaxis: lovenox Family Communication:none Disposition Plan/Barrier to D/C: unable to detemrine Code Status:     Code Status Orders  (From admission, onward)         Start     Ordered   07/22/18 1510  Full code  Continuous     07/22/18 1512        Code Status History    Date Active Date Inactive Code Status Order ID Comments User Context   07/22/2018 1433 07/22/2018 1512 Full Code [id number]  Lanier Clam, NP ED   07/15/2018 1818 07/18/2018 1912 Full Code [id number]  McKenzie, Eilene Ghazi, PA-C Inpatient    Advance Directive Documentation  Most Recent Value  Type of Advance Directive  Living will, Healthcare Power of Attorney  Pre-existing out of facility DNR order (yellow form or pink MOST form)  -  "MOST" Form in Place?  -        IV Access:    Peripheral IV   Procedures and  diagnostic studies:   Dg Chest Port 1 View  Result Date: 07/26/2018 CLINICAL DATA:  Extubated EXAM: PORTABLE CHEST 1 VIEW COMPARISON:  Chest radiograph from one day prior. FINDINGS: Interval extubation and removal of enteric tube. Partially visualized surgical hardware overlying the lower cervical spine. Stable cardiomediastinal silhouette with normal heart size. No pneumothorax. No pleural effusion. No pulmonary edema. Mild hazy and reticular opacities at the lung bases, left greater than right, not appreciably changed. IMPRESSION: Interval extubation and removal of enteric tube. Stable mild hazy and reticular opacities at the lung bases, left greater than right. Electronically Signed   By: Delbert Phenix M.D.   On: 07/26/2018 08:34     Medical Consultants:    None.  Anti-Infectives:   Rocephin  Subjective:    Corey Li patient relates he is hungry.  Speech is garbled.  Objective:    Vitals:   07/27/18 0402 07/27/18 0500 07/27/18 0732 07/27/18 0748  BP: (!) 164/96   (!) 162/92  Pulse: 83   82  Resp: 20   14  Temp: 97.9 F (36.6 C)   97.9 F (36.6 C)  TempSrc: Oral   Oral  SpO2: 100%  97% 99%  Weight:  71.9 kg    Height:        Intake/Output Summary (Last 24 hours) at 07/27/2018 0808 Last data filed at 07/27/2018 0600 Gross per 24 hour  Intake 1977.46 ml  Output 1675 ml  Net 302.46 ml   Filed Weights   07/25/18 0500 07/26/18 0500 07/27/18 0500  Weight: 75.2 kg 71.7 kg 71.9 kg    Exam: General exam: In no acute distress. Respiratory system: Good air movement and clear to auscultation. Cardiovascular system: S1 & S2 heard, RRR.  Gastrointestinal system: Abdomen is nondistended, soft and nontender.  Central nervous system: He opens his eyes is not oriented to place or person has mild dysarthria he is not cooperating pupils equally round and reactive non-cooperating he has a right facial droop tongue is midline his left side is intact but his right upper and lower  extremity is about 3 out of 5. Extremities: No pedal edema. Skin: No rashes, lesions or ulcers   Data Reviewed:    Labs: Basic Metabolic Panel: Recent Labs  Lab 07/22/18 1627  07/23/18 0448 07/23/18 1610 07/23/18 0946 07/23/18 1358 07/24/18 0014 07/25/18 0350 07/26/18 0549 07/27/18 0511  NA  --    < > 140 143 144 143 140 140 136  --   K  --    < > 3.7 3.5 3.3* 3.7 3.6 3.6 3.7  --   CL  --    < > 103 104  --   --  103 103 97*  --   CO2  --    < > 24 26  --   --  --   GLUCOSE  --    < > 224* 101*  --   --  166* 211* 114*  --   BUN  --    < > 53* 47*  --   --  24* 21* 12  --   CREATININE  --    < >  1.95* 1.74*  --   --  1.15 0.85 0.76  --   CALCIUM  --    < > 9.0 8.9  --   --  8.4* 8.3* 9.4  --   MG 2.6*  --  2.4  --   --   --  2.2  --  2.2 2.2  PHOS 5.8*  --  <1.0* <1.0*  --   --  3.1  3.2 1.6* 2.0* 2.2*   < > = values in this interval not displayed.   GFR Estimated Creatinine Clearance: 111.7 mL/min (by C-G formula based on SCr of 0.76 mg/dL). Liver Function Tests: Recent Labs  Lab 07/22/18 1234 07/24/18 0014 07/25/18 0350 07/26/18 0549  AST 40  --   --  44*  ALT 35  --   --  25  ALKPHOS 121  --   --  93  BILITOT 2.1*  --   --  0.7  PROT 8.1  --   --  6.7  ALBUMIN 4.1 3.1* 2.7* 2.9*   Recent Labs  Lab 07/22/18 1627  LIPASE 73*  AMYLASE 172*   No results for input(s): AMMONIA in the last 168 hours. Coagulation profile Recent Labs  Lab 07/22/18 1234  INR 1.2    CBC: Recent Labs  Lab 07/22/18 1234  07/23/18 0448  07/23/18 1358 07/24/18 0014 07/25/18 0350 07/26/18 0549 07/27/18 0511  WBC 18.2*  --  18.2*  --   --  21.6* 14.2* 16.5* 14.4*  NEUTROABS 15.3*  --   --   --   --   --   --  14.1* 11.9*  HGB 10.7*   < > 9.1*   < > 14.6 9.3* 9.3* 10.0* 10.4*  HCT 35.8*   < > 25.6*   < > 43.0 25.7* 27.2* 30.7* 31.2*  MCV 104.1*  --  86.5  --   --  89.2 91.6 91.6 90.4  PLT 482*  --  348  --   --  295 290 329 405*   < > = values in this  interval not displayed.   Cardiac Enzymes: Recent Labs  Lab 07/22/18 1627  TROPONINI <0.03   BNP (last 3 results) No results for input(s): PROBNP in the last 8760 hours. CBG: Recent Labs  Lab 07/26/18 2057 07/26/18 2122 07/27/18 0016 07/27/18 0405 07/27/18 0742  GLUCAP 52* 102* 85 165* 219*   D-Dimer: No results for input(s): DDIMER in the last 72 hours. Hgb A1c: No results for input(s): HGBA1C in the last 72 hours. Lipid Profile: No results for input(s): CHOL, HDL, LDLCALC, TRIG, CHOLHDL, LDLDIRECT in the last 72 hours. Thyroid function studies: No results for input(s): TSH, T4TOTAL, T3FREE, THYROIDAB in the last 72 hours.  Invalid input(s): FREET3 Anemia work up: No results for input(s): VITAMINB12, FOLATE, FERRITIN, TIBC, IRON, RETICCTPCT in the last 72 hours. Sepsis Labs: Recent Labs  Lab 07/22/18 1331 07/22/18 1627  07/24/18 0014 07/25/18 0350 07/26/18 0549 07/27/18 0511  PROCALCITON  --  0.77  --   --   --   --   --   WBC  --   --    < > 21.6* 14.2* 16.5* 14.4*  LATICACIDVEN 2.9* 2.2*  --   --   --   --   --    < > = values in this interval not displayed.   Microbiology Recent Results (from the past 240 hour(s))  Urine culture     Status: None   Collection Time: 07/22/18  1:42 PM  Result Value Ref Range Status   Specimen Description URINE, RANDOM  Final   Special Requests NONE  Final   Culture   Final    NO GROWTH Performed at [facility], [address] [place], [place] [address]    Report Status 07/23/2018 FINAL  Final  Blood culture (routine x 2)     Status: Abnormal   Collection Time: 07/22/18  2:29 PM  Result Value Ref Range Status   Specimen Description BLOOD LEFT ANTECUBITAL  Final   Special Requests   Final    BOTTLES DRAWN AEROBIC AND ANAEROBIC Blood Culture adequate volume   Culture  Setup Time   Final    GRAM POSITIVE COCCI IN BOTH AEROBIC AND ANAEROBIC BOTTLES Organism ID to follow CRITICAL RESULT CALLED TO, READ BACK BY  AND VERIFIED WITH: Babs Sciara PharmD 11:50 07/23/18 ([email or web address])    Culture (A)  Final    STAPHYLOCOCCUS SPECIES (COAGULASE NEGATIVE) THE SIGNIFICANCE OF ISOLATING THIS ORGANISM FROM A SINGLE SET OF BLOOD CULTURES WHEN MULTIPLE SETS ARE DRAWN IS UNCERTAIN. PLEASE NOTIFY THE MICROBIOLOGY DEPARTMENT WITHIN ONE WEEK IF SPECIATION AND SENSITIVITIES ARE REQUIRED. Performed at [facility], [address], [place], [place] [address]    Report Status 07/25/2018 FINAL  Final  Blood Culture ID Panel (Reflexed)     Status: Abnormal   Collection Time: 07/22/18  2:29 PM  Result Value Ref Range Status   Enterococcus species NOT DETECTED NOT DETECTED Final   Listeria monocytogenes NOT DETECTED NOT DETECTED Final   Staphylococcus species DETECTED (A) NOT DETECTED Final    Comment: Methicillin (oxacillin) resistant coagulase negative staphylococcus. Possible blood culture contaminant (unless isolated from more than one blood culture draw or clinical case suggests pathogenicity). No antibiotic treatment is indicated for blood  culture contaminants. CRITICAL RESULT CALLED TO, READ BACK BY AND VERIFIED WITH: Babs Sciara PharmD 11:50 07/23/18 ([email or web address])    Staphylococcus aureus (BCID) NOT DETECTED NOT DETECTED Final   Methicillin resistance DETECTED (A) NOT DETECTED Final    Comment: CRITICAL RESULT CALLED TO, READ BACK BY AND VERIFIED WITH: Babs Sciara PharmD 11:50 07/23/18 ([email or web address])    Streptococcus species NOT DETECTED NOT DETECTED Final   Streptococcus agalactiae NOT DETECTED NOT DETECTED Final   Streptococcus pneumoniae NOT DETECTED NOT DETECTED Final   Streptococcus pyogenes NOT DETECTED NOT DETECTED Final   Acinetobacter baumannii NOT DETECTED NOT DETECTED Final   Enterobacteriaceae species NOT DETECTED NOT DETECTED Final   Enterobacter cloacae complex NOT DETECTED NOT DETECTED Final   Escherichia coli NOT DETECTED NOT DETECTED Final   Klebsiella oxytoca NOT DETECTED NOT DETECTED Final   Klebsiella  pneumoniae NOT DETECTED NOT DETECTED Final   Proteus species NOT DETECTED NOT DETECTED Final   Serratia marcescens NOT DETECTED NOT DETECTED Final   Haemophilus influenzae NOT DETECTED NOT DETECTED Final   Neisseria meningitidis NOT DETECTED NOT DETECTED Final   Pseudomonas aeruginosa NOT DETECTED NOT DETECTED Final   Candida albicans NOT DETECTED NOT DETECTED Final   Candida glabrata NOT DETECTED NOT DETECTED Final   Candida krusei NOT DETECTED NOT DETECTED Final   Candida parapsilosis NOT DETECTED NOT DETECTED Final   Candida tropicalis NOT DETECTED NOT DETECTED Final    Comment: Performed at [facility], [address], [place], [place] [address]  Blood culture (routine x 2)     Status: None (Preliminary result)   Collection Time: 07/22/18  4:26 PM  Result Value Ref Range Status   Specimen  Description BLOOD RIGHT ANTECUBITAL  Final   Special Requests   Final    BOTTLES DRAWN AEROBIC ONLY Blood Culture adequate volume   Culture   Final    NO GROWTH 4 DAYS Performed at [facility], [address] [place], [place] [address]    Report Status PENDING  Incomplete  Respiratory Panel by PCR     Status: None   Collection Time: 07/22/18  5:08 PM  Result Value Ref Range Status   Adenovirus NOT DETECTED NOT DETECTED Final   Coronavirus 229E NOT DETECTED NOT DETECTED Final    Comment: (NOTE) The Coronavirus on the Respiratory Panel, DOES NOT test for the novel  Coronavirus (2019 nCoV)    Coronavirus HKU1 NOT DETECTED NOT DETECTED Final   Coronavirus NL63 NOT DETECTED NOT DETECTED Final   Coronavirus OC43 NOT DETECTED NOT DETECTED Final   Metapneumovirus NOT DETECTED NOT DETECTED Final   Rhinovirus / Enterovirus NOT DETECTED NOT DETECTED Final   Influenza A NOT DETECTED NOT DETECTED Final   Influenza B NOT DETECTED NOT DETECTED Final   Parainfluenza Virus 1 NOT DETECTED NOT DETECTED Final   Parainfluenza Virus 2 NOT DETECTED NOT DETECTED Final   Parainfluenza Virus 3 NOT  DETECTED NOT DETECTED Final   Parainfluenza Virus 4 NOT DETECTED NOT DETECTED Final   Respiratory Syncytial Virus NOT DETECTED NOT DETECTED Final   Bordetella pertussis NOT DETECTED NOT DETECTED Final   Chlamydophila pneumoniae NOT DETECTED NOT DETECTED Final   Mycoplasma pneumoniae NOT DETECTED NOT DETECTED Final    Comment: Performed at [facility], [address] [place], [place] [address]  MRSA PCR Screening     Status: None   Collection Time: 07/23/18  9:31 AM  Result Value Ref Range Status   MRSA by PCR NEGATIVE NEGATIVE Final    Comment:        The GeneXpert MRSA Assay (FDA approved for NASAL specimens only), is one component of a comprehensive MRSA colonization surveillance program. It is not intended to diagnose MRSA infection nor to guide or monitor treatment for MRSA infections. Performed at [facility], [address] [place], [place] [address]   Culture, respiratory (non-expectorated)     Status: None   Collection Time: 07/23/18 11:42 AM  Result Value Ref Range Status   Specimen Description TRACHEAL ASPIRATE  Final   Special Requests NONE  Final   Gram Stain   Final    NO WBC SEEN FEW BUDDING YEAST SEEN FEW GRAM POSITIVE RODS FEW GRAM POSITIVE COCCI IN PAIRS    Culture   Final    MODERATE CANDIDA TROPICALIS MODERATE GROUP B STREP(S.AGALACTIAE)ISOLATED TESTING AGAINST S. AGALACTIAE NOT ROUTINELY PERFORMED DUE TO PREDICTABILITY OF AMP/PEN/VAN SUSCEPTIBILITY. Performed at [facility], [address] [place], [place] [address]    Report Status 07/25/2018 FINAL  Final     Medications:   . amLODipine  10 mg Oral Daily  . aspirin EC  325 mg Oral Daily   Or  . aspirin  300 mg Rectal Daily  . atorvastatin  80 mg Oral Daily  . enoxaparin (LOVENOX) injection  40 mg Subcutaneous Q24H  . etomidate  20 mg Intravenous Once  . insulin aspart  3-9 Units Subcutaneous Q4H  . insulin detemir  18 Units Subcutaneous Q12H  . ipratropium-albuterol   3 mL Nebulization BID  . levothyroxine  175 mcg Per Tube QAC breakfast  . lisinopril  20 mg Oral Daily  . mouth rinse  15 mL Mouth Rinse BID  . rocuronium  100 mg Intravenous Once   Continuous Infusions: . sodium chloride 10 mL/hr at 07/23/18 0700  . sodium chloride 100 mL/hr at 07/27/18 0148  . cefTRIAXone (ROCEPHIN)  IV 2 g (07/26/18 1518)  . feeding supplement (GLUCERNA 1.5 CAL) Stopped (07/25/18 1205)      LOS: 5 days   Marinda Elk  [facility]  07/27/2018, 8:08 AM

## 2018-07-27 NOTE — Progress Notes (Signed)
Assisted tele visit to patient with family member.  Lira Stephen Parker, RN  

## 2018-07-27 NOTE — Progress Notes (Addendum)
Physical Therapy Treatment Patient Details Name: Corey Li MRN: [id number] DOB: [birth year] Today's Date: 07/27/2018    History of Present Illness Pt is a 48 yo male s/p multifocal infarcts, bacteremia, sepsis and DKA. Pt PMHx: 4/1 A/P CDF C5-7, DM, anxiety, depression, HTN.    PT Comments    Patient seen for mobility progression. Pt is lethargic but more participatory once in sitting EOB. Pt requires +2 assist for all bed mobility and varying levels of assistance for sitting balance. Pt continues to present with R side hemiplegia and with limited use of L UE due to pain with attempted UE extension. Pt is tracking to R side this session. Pt verbalizing minimally during session but expressed appreciation end of session. Continue to progress as tolerated.   After session, pt noted to need soft collar per Dr. Yevette Edwardsumonski. No collar in room. This therapist spoke to PT, Lavona MoundMark Sawulski, for appropriateness of treatment without soft collar. Cervical precautions were maintained throughout session.    Follow Up Recommendations  SNF     Equipment Recommendations  Other (comment)(TBD next venue)    Recommendations for Other Services       Precautions / Restrictions Precautions Precautions: Cervical;Fall Precaution Comments: cortrak placed 4/13 Cervical Brace: Soft collar(per Dr. Yevette Edwardsumonski note 4/13) Restrictions Weight Bearing Restrictions: No    Mobility  Bed Mobility Overal bed mobility: Needs Assistance Bed Mobility: Rolling;Sidelying to Sit;Sit to Sidelying Rolling: Max assist;+2 for safety/equipment Sidelying to sit: Max assist;+2 for physical assistance;HOB elevated     Sit to sidelying: Total assist;+2 for physical assistance General bed mobility comments: multimodal cues for sequencing; assist for all aspects of bed mobility with pt assisting as able with L LE/UE; cervical spine supported throughout   Transfers                    Ambulation/Gait                  Stairs             Wheelchair Mobility    Modified Rankin (Stroke Patients Only)       Balance Overall balance assessment: Needs assistance Sitting-balance support: Single extremity supported;Feet supported;Bilateral upper extremity supported Sitting balance-Leahy Scale: Poor Sitting balance - Comments: worked on sitting balance EOB initially requiring max A however able to maintain with min A at times especially when R UE supported                                    Cognition Arousal/Alertness: Lethargic Behavior During Therapy: Flat affect Overall Cognitive Status: Impaired/Different from baseline Area of Impairment: Attention;Following commands;Safety/judgement;Awareness                   Current Attention Level: Focused   Following Commands: Follows one step commands inconsistently;Follows one step commands with increased time Safety/Judgement: Decreased awareness of safety Awareness: Intellectual          Exercises General Exercises - Lower Extremity Long Arc Quad: AROM;Left;5 reps;Seated    General Comments        Pertinent Vitals/Pain Pain Assessment: Faces Faces Pain Scale: Hurts even more Pain Location: L UE when attempting extension and grimacing with some bed mobility Pain Descriptors / Indicators: Grimacing;Guarding Pain Intervention(s): Limited activity within patient's tolerance;Monitored during session;Repositioned    Home Living  Prior Function            PT Goals (current goals can now be found in the care plan section) Acute Rehab PT Goals Patient Stated Goal: unable to state Progress towards PT goals: Progressing toward goals    Frequency    Min 4X/week      PT Plan Current plan remains appropriate    Co-evaluation PT/OT/SLP Co-Evaluation/Treatment: Yes Reason for Co-Treatment: Complexity of the patient's impairments (multi-system involvement);Necessary to address  cognition/behavior during functional activity;For patient/therapist safety PT goals addressed during session: Mobility/safety with mobility;Balance;Strengthening/ROM        AM-PAC PT "6 Clicks" Mobility   Outcome Measure  Help needed turning from your back to your side while in a flat bed without using bedrails?: Total Help needed moving from lying on your back to sitting on the side of a flat bed without using bedrails?: Total Help needed moving to and from a bed to a chair (including a wheelchair)?: Total Help needed standing up from a chair using your arms (e.g., wheelchair or bedside chair)?: Total Help needed to walk in hospital room?: Total Help needed climbing 3-5 steps with a railing? : Total 6 Click Score: 6    End of Session Equipment Utilized During Treatment: Oxygen Activity Tolerance: Patient tolerated treatment well Patient left: with call bell/phone within reach;in bed;with bed alarm set Nurse Communication: Mobility status PT Visit Diagnosis: Other abnormalities of gait and mobility (R26.89);Pain Pain - Right/Left: Left Pain - part of body: Arm     Time: 5035-4656 PT Time Calculation (min) (ACUTE ONLY): 28 min  Charges:  $Therapeutic Activity: 8-22 mins                     Erline Levine, PTA Acute Rehabilitation Services Pager: [phone] Office: [phone]     Carolynne Edouard 07/27/2018, 1:26 PM

## 2018-07-27 NOTE — Progress Notes (Addendum)
  Speech Language Pathology Treatment: Dysphagia  Patient Details Name: Corey Li MRN: [id number] DOB: Mar 21, 1971 Today's Date: 07/27/2018 Time: 1020-1040 SLP Time Calculation (min) (ACUTE ONLY): 20 min  Assessment / Plan / Recommendation Clinical Impression  Pt seen at bedside immediately following cortrak placement. Pt awake and alert, intermittent wet voice quality noted. Oral care completed with suction with thin secretions removed. Pt continues to present with neck extension, and is unable to lower chin to a neutral position despite verbal, visual, and tactile cues. Voice intensity is stronger today. Pt was given individual ice chips, which resulted in left anterior leakage and slow oral prep. Multiple swallows were elicited from one ice chip. Water was given via straw, which also resulted in multiple swallows and change in voice quality. Cued cough better today, but continues weak and nonproductive. Pt is making improvements, although slowly. Recommend continued NPO status at this time. SLP will continue to follow to determine readiness to proceed with instrumental assessment, based on improved bedside presentation.   Pt requesting to call his girlfriend - RN informed.     HPI HPI: Pt is a 48 yo male s/p anterior/posterior CDF 4/1. Presented from hotel room with AMS, hypoxia and right sided weakness. Intubated 4/8-4/11. MRI multifocal bilateral infarcts, right cerebellum, posterior cerebral artery, left basal gangles. Also fouind to have bacteremia, sepsis and DKA. Pt PMH: DM, anxiety, depression, HTN. CXR interval extubation and removal of enteric tube. Stable mild hazy and reticular opacities at the lung bases, left greater than right.      SLP Plan  Continue with current plan of care       Recommendations  Diet recommendations: NPO Medication Administration: Via alternative means                General recommendations: Rehab consult Oral Care Recommendations: Oral care  QID Follow up Recommendations: (anticipate need for SLP services at next level of care) SLP Visit Diagnosis: Dysphagia, unspecified (R13.10) Plan: Continue with current plan of care       GO               Celia B. Murvin [facility], CCC-SLP Speech Language Pathologist [phone]  Leigh Aurora 07/27/2018, 10:43 AM

## 2018-07-27 NOTE — Progress Notes (Signed)
Occupational Therapy Treatment Patient Details Name: Corey Li MRN: [id number] DOB: 02-03-1971 Today's Date: 07/27/2018    History of present illness Pt is a 48 yo male s/p multifocal infarcts, bacteremia, sepsis and DKA. Pt PMHx: 4/1 A/P CDF C5-7, DM, anxiety, depression, HTN.   OT comments  Patient supine in bed, lethargic but improved alertness when transitioned to sitting EOB.  Requires +2 max to total assist for all aspects of bed mobility and maintaining sitting balance EOB with max assist with R lateral lean, cervical spine protected throughout due to recent sx.  Noted after treatment, ortho recommending soft collar use.  Patient able to follow some simple 1 step commands given increased time and multimodal cueing, continues to remain flaccid on R side with L gaze preference but able to scan towards R with max cueing. Noted pain with L elbow extension.  Will follow.    Follow Up Recommendations  SNF;Supervision/Assistance - 24 hour    Equipment Recommendations  Other (comment)(TBD )    Recommendations for Other Services      Precautions / Restrictions Precautions Precautions: Cervical;Fall Precaution Comments: cortrak placed 4/13 Required Braces or Orthoses: Cervical Brace Cervical Brace: Soft collar(per Dr. Azucena Cecil note 4/13) Restrictions Weight Bearing Restrictions: No       Mobility Bed Mobility Overal bed mobility: Needs Assistance Bed Mobility: Rolling;Sidelying to Sit;Sit to Sidelying Rolling: Max assist;+2 for safety/equipment Sidelying to sit: Max assist;+2 for physical assistance;HOB elevated     Sit to sidelying: Total assist;+2 for physical assistance General bed mobility comments: multimodal cues for sequencing; assist for all aspects of bed mobility with pt assisting as able with L LE/UE; cervical spine supported throughout   Transfers Overall transfer level: Needs assistance               General transfer comment: deferred due to safety      Balance Overall balance assessment: Needs assistance Sitting-balance support: Single extremity supported;Feet supported;Bilateral upper extremity supported Sitting balance-Leahy Scale: Poor Sitting balance - Comments: worked on sitting balance EOB initially requiring max A however able to maintain with min A at times especially when R UE supported                                   ADL either performed or assessed with clinical judgement   ADL Overall ADL's : Needs assistance/impaired     Grooming: Maximal assistance;Total assistance;Bed level;Wash/dry face                     Toilet Transfer Details (indicate cue type and reason): deferred          Functional mobility during ADLs: Maximal assistance;Total assistance(limited to EOB ) General ADL Comments: limited by cognition, R hemiparesis, decreased balance      Vision   Vision Assessment?: Vision impaired- to be further tested in functional context;Yes Additional Comments: max multimodal cueing to scan towards R side    Perception     Praxis      Cognition Arousal/Alertness: Lethargic Behavior During Therapy: Flat affect Overall Cognitive Status: Impaired/Different from baseline Area of Impairment: Attention;Following commands;Safety/judgement;Awareness                   Current Attention Level: Focused   Following Commands: Follows one step commands inconsistently;Follows one step commands with increased time Safety/Judgement: Decreased awareness of safety Awareness: Intellectual   General Comments: pt following simple commands, able to  squeeze hand, kick L LE 3x; limited verbalizations and difficult to understand         Exercises Exercises: General Lower Extremity General Exercises - Lower Extremity Long Arc Quad: AROM;Left;5 reps;Seated Other Exercises Other Exercises: PROM R UE    Shoulder Instructions       General Comments      Pertinent Vitals/ Pain       Pain  Assessment: Faces Faces Pain Scale: Hurts even more Pain Location: L UE when attempting extension and grimacing with some bed mobility Pain Descriptors / Indicators: Grimacing;Guarding Pain Intervention(s): Limited activity within patient's tolerance;Monitored during session;Repositioned  Home Living                                          Prior Functioning/Environment              Frequency  Min 3X/week        Progress Toward Goals  OT Goals(current goals can now be found in the care plan section)  Progress towards OT goals: Progressing toward goals  Acute Rehab OT Goals Patient Stated Goal: unable to state Time For Goal Achievement: 08/09/18 Potential to Achieve Goals: Good  Plan Frequency remains appropriate;Discharge plan needs to be updated    Co-evaluation    PT/OT/SLP Co-Evaluation/Treatment: Yes Reason for Co-Treatment: Complexity of the patient's impairments (multi-system involvement);Necessary to address cognition/behavior during functional activity;For patient/therapist safety;To address functional/ADL transfers PT goals addressed during session: Mobility/safety with mobility;Balance;Strengthening/ROM OT goals addressed during session: ADL's and self-care      AM-PAC OT "6 Clicks" Daily Activity     Outcome Measure   Help from another person eating meals?: Total Help from another person taking care of personal grooming?: Total Help from another person toileting, which includes using toliet, bedpan, or urinal?: Total Help from another person bathing (including washing, rinsing, drying)?: Total Help from another person to put on and taking off regular upper body clothing?: Total Help from another person to put on and taking off regular lower body clothing?: Total 6 Click Score: 6    End of Session Equipment Utilized During Treatment: Oxygen  OT Visit Diagnosis: Unsteadiness on feet (R26.81);Muscle weakness (generalized)  (M62.81);Hemiplegia and hemiparesis;Other symptoms and signs involving cognitive function Hemiplegia - Right/Left: Right Hemiplegia - dominant/non-dominant: Dominant Hemiplegia - caused by: Cerebral infarction   Activity Tolerance Patient tolerated treatment well   Patient Left in bed;with call bell/phone within reach;with bed alarm set;with restraints reapplied   Nurse Communication Mobility status        Time: 4098-11911150-1218 OT Time Calculation (min): 28 min  Charges: OT General Charges $OT Visit: 1 Visit OT Treatments $Self Care/Home Management : 8-22 mins  Chancy Milroyhristie S Darshana Curnutt, OT Acute Rehabilitation Services Pager [phone] Office [phone]    Chancy MilroyChristie S Izan Miron 07/27/2018, 3:03 PM

## 2018-07-28 ENCOUNTER — Inpatient Hospital Stay (HOSPITAL_COMMUNITY): Payer: Medicaid Other

## 2018-07-28 LAB — GLUCOSE, CAPILLARY
Glucose-Capillary: 158 mg/dL — ABNORMAL HIGH (ref 70–99)
Glucose-Capillary: 31 mg/dL — CL (ref 70–99)
Glucose-Capillary: 121 mg/dL — ABNORMAL HIGH (ref 70–99)
Glucose-Capillary: 153 mg/dL — ABNORMAL HIGH (ref 70–99)
Glucose-Capillary: 220 mg/dL — ABNORMAL HIGH (ref 70–99)
Glucose-Capillary: 169 mg/dL — ABNORMAL HIGH (ref 70–99)

## 2018-07-28 LAB — CBC WITH DIFFERENTIAL/PLATELET
Abs Immature Granulocytes: 0.22 10*3/uL — ABNORMAL HIGH (ref 0.00–0.07)
Basophils Absolute: 0.1 10*3/uL (ref 0.0–0.1)
Basophils Relative: 0 %
Eosinophils Absolute: 0.5 10*3/uL (ref 0.0–0.5)
Eosinophils Relative: 4 %
HCT: 29.5 % — ABNORMAL LOW (ref 39.0–52.0)
Hemoglobin: 10 g/dL — ABNORMAL LOW (ref 13.0–17.0)
Immature Granulocytes: 2 %
Lymphocytes Relative: 8 %
Lymphs Abs: 1.1 10*3/uL (ref 0.7–4.0)
MCH: 30.4 pg (ref 26.0–34.0)
MCHC: 33.9 g/dL (ref 30.0–36.0)
MCV: 89.7 fL (ref 80.0–100.0)
Monocytes Absolute: 1.2 10*3/uL — ABNORMAL HIGH (ref 0.1–1.0)
Monocytes Relative: 9 %
Neutro Abs: 10.6 10*3/uL — ABNORMAL HIGH (ref 1.7–7.7)
Neutrophils Relative %: 77 %
Platelets: 506 10*3/uL — ABNORMAL HIGH (ref 150–400)
RBC: 3.29 MIL/uL — ABNORMAL LOW (ref 4.22–5.81)
RDW: 13.9 % (ref 11.5–15.5)
WBC: 13.6 10*3/uL — ABNORMAL HIGH (ref 4.0–10.5)
nRBC: 0 % (ref 0.0–0.2)

## 2018-07-28 LAB — BASIC METABOLIC PANEL
Anion gap: 8 (ref 5–15)
BUN: 16 mg/dL (ref 6–20)
CO2: 25 mmol/L (ref 22–32)
Calcium: 8.6 mg/dL — ABNORMAL LOW (ref 8.9–10.3)
Chloride: 102 mmol/L (ref 98–111)
Creatinine, Ser: 0.85 mg/dL (ref 0.61–1.24)
GFR calc Af Amer: >60 mL/min (ref >60)
GFR calc non Af Amer: >60 mL/min (ref >60)
Glucose, Bld: 150 mg/dL — ABNORMAL HIGH (ref 70–99)
Potassium: 3.7 mmol/L (ref 3.5–5.1)
Sodium: 135 mmol/L (ref 135–145)

## 2018-07-28 LAB — MAGNESIUM: Magnesium: 2.2 mg/dL (ref 1.7–2.4)

## 2018-07-28 LAB — PHOSPHORUS: Phosphorus: 2.6 mg/dL (ref 2.5–4.6)

## 2018-07-28 MED ORDER — DEXTROSE 50 % IV SOLN
25.0000 g | INTRAVENOUS | Status: AC
  Administered 2018-07-28: 08:00:00 25 g via INTRAVENOUS
  Filled 2018-07-28: qty 50

## 2018-07-28 NOTE — Progress Notes (Signed)
  Speech Language Pathology Treatment: Dysphagia  Patient Details Name: Corey Li MRN: [id number] DOB: 01-Feb-1971 Today's Date: 07/28/2018 Time: 5009-3818 SLP Time Calculation (min) (ACUTE ONLY): 13 min  Assessment / Plan / Recommendation Clinical Impression  SLP paged by MD re: whether pt can initiate po diet or need longer term nutrition. Pt was easily awakened from deep sleep and able to initiate swallow after oral care administered. Oral transit was delayed and 4-5 swallows per bite applesauce. Suspect aspiration with coughing episode after 4 minutes. Pt is appropriate for instrumental swallow assessment scheduled for 1330 today.    HPI HPI: Pt is a 48 yo male s/p anterior/posterior CDF 4/1. Presented from hotel room with AMS, hypoxia and right sided weakness. Intubated 4/8-4/11. MRI multifocal bilateral infarcts, right cerebellum, posterior cerebral artery, left basal gangles. Also fouind to have bacteremia, sepsis and DKA. Pt PMH: DM, anxiety, depression, HTN. CXR interval extubation and removal of enteric tube. Stable mild hazy and reticular opacities at the lung bases, left greater than right.      SLP Plan  New goals to be determined pending instrumental study       Recommendations  Diet recommendations: NPO Medication Administration: Via alternative means                Oral Care Recommendations: Oral care QID Follow up Recommendations: Skilled Nursing facility SLP Visit Diagnosis: Dysphagia, unspecified (R13.10) Plan: New goals to be determined pending instrumental study       GO                [name] 07/28/2018, 1:25 PM  [name] Somaly Marteney M.Ed Nurse, children's [phone] Office [phone]

## 2018-07-28 NOTE — Progress Notes (Signed)
Modified Barium Swallow Progress Note  Patient Details  Name: Corey Li MRN: [id number] Date of Birth: 1970-11-11  Today's Date: 07/28/2018  Modified Barium Swallow completed.  Full report located under Chart Review in the Imaging Section.  Brief recommendations include the following:  Clinical Impression  Pt demonstrated moderate-severe oropharyngeal dysphagia with penetration and brief aspiration with puree texture. Pt's anterior hyolaryngeal elevation was significantly decreased, no discernable epiglottic deflection or pharyngeal contraction attributed to silent penetration and brief aspiration of puree texture, max vallecular residue. Pt produced a delayed cough after approximately 4 minutes. Oral phase marked by lingual pumping, prolonged transit and lingual residue. Pt requires extended amount of time to produce dry swallow and he cannot produce effective cough to clear penetrates. Due to limitations in pt's cognition and neck ROM, strategies were not attempted. He is not safe for po's and should continue non oral means of nutrition. Prognosis is good given age but may be extended given effects of A/P CDF and stroke on swallow function. Will continue intervention.       Swallow Evaluation Recommendations       SLP Diet Recommendations: NPO       Medication Administration: Via alternative means               Oral Care Recommendations: Oral care QID        [name] 07/28/2018,4:28 PM   [name] [name].Ed Nurse, children's [phone] Office [phone]

## 2018-07-28 NOTE — Progress Notes (Signed)
Physical Therapy Treatment Patient Details Name: Corey Li MRN: [id number] DOB: [birth year] Today's Date: 07/28/2018    History of Present Illness Pt is a 48 yo male s/p multifocal infarcts, bacteremia, sepsis and DKA. Pt PMHx: 4/1 A/P CDF C5-7, DM, anxiety, depression, HTN.    PT Comments    Patient seen for mobility progression. Pt is more alert and participatory during session today. Pt continues to require max/total A +2 for bed mobility and mod/max A for sitting balance EOB. Pt continues to c/o L UE pain at shoulder and with L elbow extension. Continue to progress as tolerated.     Follow Up Recommendations  SNF     Equipment Recommendations  (TBD next venue)    Recommendations for Other Services       Precautions / Restrictions Precautions Precautions: Cervical;Fall Precaution Comments: cortrak placed 4/13 Required Braces or Orthoses: Cervical Brace Cervical Brace: Soft collar(per Dr. Azucena Cecil note 4/13) Restrictions Weight Bearing Restrictions: No    Mobility  Bed Mobility Overal bed mobility: Needs Assistance Bed Mobility: Rolling;Sidelying to Sit;Sit to Sidelying Rolling: Max assist;+2 for safety/equipment Sidelying to sit: Max assist;+2 for physical assistance;HOB elevated     Sit to sidelying: Total assist;+2 for physical assistance General bed mobility comments: pt assisted more this session and used bed rail to roll toward R side; multimodal cues for sequencing; assist for all aspects of bed mobility with pt assisting as able with L LE/UE; cervical spine supported throughout   Transfers                    Ambulation/Gait                 Stairs             Wheelchair Mobility    Modified Rankin (Stroke Patients Only)       Balance Overall balance assessment: Needs assistance Sitting-balance support: Single extremity supported;Feet supported;Bilateral upper extremity supported Sitting balance-Leahy Scale: Poor Sitting  balance - Comments: worked on sitting balance EOB including attempting L lateral push ups Postural control: Right lateral lean                                  Cognition Arousal/Alertness: Awake/alert Behavior During Therapy: Flat affect Overall Cognitive Status: Impaired/Different from baseline Area of Impairment: Attention;Following commands;Safety/judgement;Awareness                   Current Attention Level: Focused   Following Commands: Follows one step commands inconsistently;Follows one step commands with increased time Safety/Judgement: Decreased awareness of safety Awareness: Intellectual          Exercises General Exercises - Lower Extremity Long Arc Quad: AROM;Left;Seated;10 reps Hip Flexion/Marching: Left;10 reps;Seated Other Exercises Other Exercises: PROM R LE     General Comments        Pertinent Vitals/Pain Pain Assessment: Faces Faces Pain Scale: Hurts even more Pain Location: L UE (when attempting elbow extension and shoulder) Pain Descriptors / Indicators: Grimacing;Guarding Pain Intervention(s): Limited activity within patient's tolerance;Monitored during session;Repositioned    Home Living                      Prior Function            PT Goals (current goals can now be found in the care plan section) Acute Rehab PT Goals Patient Stated Goal: unable to state Progress towards PT goals:  Progressing toward goals    Frequency    Min 4X/week      PT Plan Current plan remains appropriate    Co-evaluation              AM-PAC PT "6 Clicks" Mobility   Outcome Measure  Help needed turning from your back to your side while in a flat bed without using bedrails?: Total Help needed moving from lying on your back to sitting on the side of a flat bed without using bedrails?: Total Help needed moving to and from a bed to a chair (including a wheelchair)?: Total Help needed standing up from a chair using your  arms (e.g., wheelchair or bedside chair)?: Total Help needed to walk in hospital room?: Total Help needed climbing 3-5 steps with a railing? : Total 6 Click Score: 6    End of Session Equipment Utilized During Treatment: Oxygen Activity Tolerance: Patient tolerated treatment well Patient left: in bed;with call bell/phone within reach;with bed alarm set;with SCD's reapplied Nurse Communication: Mobility status PT Visit Diagnosis: Other abnormalities of gait and mobility (R26.89);Pain Pain - Right/Left: Left Pain - part of body: Arm     Time: 1610-96040933-1004 PT Time Calculation (min) (ACUTE ONLY): 31 min  Charges:  $Therapeutic Activity: 23-37 mins                     Erline LevineKellyn Abie Cheek, PTA Acute Rehabilitation Services Pager: [phone] Office: [phone]     Carolynne EdouardKellyn R Duwane Gewirtz 07/28/2018, 1:23 PM

## 2018-07-28 NOTE — Progress Notes (Signed)
Assisted Televisit with friend Corey Li. Multiple attempts made to her Email address before visitor answered. I personally spoke with her on  the telephone while signing her on. Visitor was rude and cussing with staff here and on [facility] regarding technical issues she was experiencing. I let her stay on a 30 minute visit and she called here to Elink x 2 cursing at charge nurse. I personally checked and I could her talking to the patient. The connection was fine on this end. Corey Li preceded to call here and was verbally abusive to our charge nurse Corey Li. He was spoke with a calm and reassuring voice trying to calm her down but she was relentless in cursing and he told her he would have to hang up if she did not stop. She then called [facility] and was abusive to RN there. Tried to call here multiple times and Charge nurse asked her to try again tomorrow night but we would not camera back in tonight.                                Tashia Leiterman RN  Corey Li, Corey Riley, RN

## 2018-07-28 NOTE — Progress Notes (Signed)
Hypoglycemic Event  CBG: 31  Treatment: Dextrose 50% solution 25mg   Follow-up CBG      CBG Result:122  MD notified    Madell Heino

## 2018-07-28 NOTE — Progress Notes (Signed)
Occupational Therapy Treatment Patient Details Name: Corey Li [place] MRN: [id number] DOB: 22-Nov-1970 Today's Date: 07/28/2018    History of present illness Pt is a 48 yo male s/p multifocal infarcts, bacteremia, sepsis and DKA. Pt PMHx: 4/1 A/P CDF C5-7, DM, anxiety, depression, HTN.   OT comments  Pt seen for OT today. Pt extremely lethargic and required max cues and stimulation to alert for brief periods.  Pt was able to state name, age and respond to questions regarding pain.  Pt also able to follow 1 step commands inconsistently for LUE.  Pt unable to maintain focused attention beyond direct stimulation and even then only for brief periods of time.     Follow Up Recommendations  SNF;Supervision/Assistance - 24 hour    Equipment Recommendations  Other (comment)(defer to next level of care)    Recommendations for Other Services      Precautions / Restrictions Precautions Precautions: Cervical;Fall Precaution Booklet Issued: Yes (comment) Precaution Comments: cortrak placed 4/13 Required Braces or Orthoses: Cervical Brace Cervical Brace: Soft collar Restrictions Weight Bearing Restrictions: No       Mobility Bed                   Balance                               ADL either performed or assessed with clinical judgement   ADL Overall ADL's : Needs assistance/impaired Eating/Feeding: NPO   Grooming: Total assistance;Bed level;Wash/dry face Grooming Details (indicate cue type and reason): pt very lethargic and required max cues and total assist                                     Vision       Perception     Praxis      Cognition Arousal/Alertness: Lethargic Behavior During Therapy: (Pt very lethargic and difficult to arouse ) Overall Cognitive Status: Impaired/Different from baseline Area of Impairment: Attention;Following commands;Safety/judgement;Awareness                   Current Attention Level: Focused    Following Commands: Follows one step commands inconsistently;Follows one step commands with increased time Safety/Judgement: Decreased awareness of safety Awareness: Intellectual   General Comments: Pt able to raise LUE, open eyes, state name and age upon command.  Pt very lethargic however and required constant stimulation to remain awake.         Exercises  Other Exercises Other Exercises: PROM RUE.  Pt also with moderate edema in R hand - able to effectively manage with edema massage and pt tolerated well. RUE positioned in elevation and discussed with RN regarding edema  and positioning.    Shoulder Instructions       General Comments  Pt limited in ability to participate today to significant lethargy    Pertinent Vitals/ Pain       Pain Assessment: No/denies pain(Pt able to verbalize that he had no pain) Pain Score: 0-No pain Faces Pain Scale: Hurts even more Pain Location: Pt able to state no pain with active or PROM of LUE 90* of shoulder flexion in supine as well as elbow flexion/extension this afternoon Pain Descriptors / Indicators: Grimacing;Guarding Pain Intervention(s): Limited activity within patient's tolerance;Monitored during session;Repositioned  Home Living  Prior Functioning/Environment              Frequency  Min 3X/week        Progress Toward Goals  OT Goals(current goals can now be found in the care plan section)  Progress towards OT goals: Progressing toward goals  Acute Rehab OT Goals Patient Stated Goal: unable to state OT Goal Formulation: With patient Time For Goal Achievement: 08/09/18 Potential to Achieve Goals: Fair  Plan      Co-evaluation                 AM-PAC OT "6 Clicks" Daily Activity     Outcome Measure   Help from another person eating meals?: Total Help from another person taking care of personal grooming?: Total Help from another person toileting,  which includes using toliet, bedpan, or urinal?: Total Help from another person bathing (including washing, rinsing, drying)?: Total Help from another person to put on and taking off regular upper body clothing?: Total Help from another person to put on and taking off regular lower body clothing?: Total 6 Click Score: 6    End of Session Equipment Utilized During Treatment: Oxygen  OT Visit Diagnosis: Unsteadiness on feet (R26.81);Muscle weakness (generalized) (M62.81);Hemiplegia and hemiparesis;Other symptoms and signs involving cognitive function;Other symptoms and signs involving the nervous system (R29.898) Hemiplegia - Right/Left: Right Hemiplegia - dominant/non-dominant: Dominant Hemiplegia - caused by: Cerebral infarction   Activity Tolerance Patient limited by lethargy   Patient Left in bed;with call bell/phone within reach;with bed alarm set   Nurse Communication Other (comment)(edema mgmt for R hand, arousal level)        Time: 1601-0932 OT Time Calculation (min): 19 min  Charges: OT General Charges $OT Visit: 1 Visit OT Treatments $Self Care/Home Management : 8-22 mins     Norton Pastel, OTR/L 07/28/2018, 2:34 PM

## 2018-07-28 NOTE — Progress Notes (Signed)
Inpatient Rehab Admissions Coordinator:   CIR consult received.  Noted therapy recommendations are for SNF, and pt with no support at discharge.  Will sign off at this time.   Estill Dooms, PT, DPT Admissions Coordinator [phone] 07/28/18  9:54 AM

## 2018-07-28 NOTE — Progress Notes (Addendum)
Inpatient Diabetes Program Recommendations  AACE/ADA: New Consensus Statement on Inpatient Glycemic Control (2015)  Target Ranges:  Prepandial:   less than 140 mg/dL      Peak postprandial:   less than 180 mg/dL (1-2 hours)      Critically ill patients:  140 - 180 mg/dL   Lab Results  Component Value Date   GLUCAP 121 (H) 07/28/2018   HGBA1C 10.9 (H) 07/13/2018    Review of Glycemic Control  Diabetes history: DM 2 Outpatient Diabetes medications: NPH 47 units qam, 36 units qpm, Regular insulin 27 units in am, 17 units qpm, Metformin 1000 mg BID Current orders for Inpatient glycemic control: Levemir 18 units Q12 hours, Novolog 3-9 units Q4 hours  Inpatient Diabetes Program Recommendations:    Hypoglycemia of 52 mg/dl at 3646 2 days ago. Hypoglycemia again this am in the 30's.    -  Consider d/cing ICU order set (currently on ICU Resistant scale) for glycemic control and order Novolog Sensitive Correction scale 0-9 unit Q4.    -  Patient receiving bolus feeds during the day Consider Bolus Feed Coverage Novolog 3 units tid with feeds.   -  May also consider decreasing Levemir to 15 units BID.  Thanks,  Christena Deem RN, MSN, BC-ADM Inpatient Diabetes Coordinator Team Pager [phone] (8a-5p)

## 2018-07-28 NOTE — Progress Notes (Signed)
Nutrition Follow-up   RD working remotely.  DOCUMENTATION CODES:   Not applicable  INTERVENTION:  Continue bolus tube feeds using Glucerna 1.2 formula via Cortrak NGT at goal volume of 480 ml (2 cartons/ARCs) given TID.   Continue 30 ml Prostat BID per tube.   Tube feeding regimen to provide 1928 kcal (100% of needs), 116 grams of protein, and 1166 ml free water.  Once IV fluids are discontinued, recommend free water flushes of 200 ml given TID between boluses.  NUTRITION DIAGNOSIS:   Inadequate oral intake related to acute illness as evidenced by NPO status; ongoing  GOAL:   Patient will meet greater than or equal to 90% of their needs; met via TF.  MONITOR:   TF tolerance, Diet advancement, Labs, Skin, Weight trends, I & O's  REASON FOR ASSESSMENT:   Ventilator    ASSESSMENT:   48 yo male admitted with DKA, acute encephalopathy with right sided weakness, acute respiratory failure requiring intubation. PMH includes DM, HTN, HLD, GERD. MRI of the brain showed multi-ischemic insults in multiple vascular territories.  Extubated 4/11. Cortrak NGT placed 4/13.   Per RN, pt has been tolerating his bolus tube feedings well. RN reports pt continues to have non-productive coughing and suspects cough not related to feedings as onset is unpredictable. RD to continue with current tube feeding regiment and will monitor for tolerance. Labs and medications reviewed.   Diet Order:   Diet Order            Diet NPO time specified  Diet effective now              EDUCATION NEEDS:   Not appropriate for education at this time  Skin:  Skin Assessment: Reviewed RN Assessment  Last BM:  4/14  Height:   Ht Readings from Last 1 Encounters:  07/22/18 5' 9" (1.753 m)    Weight:   Wt Readings from Last 1 Encounters:  07/27/18 71.9 kg    Ideal Body Weight:  72.7 kg  BMI:  Body mass index is 23.41 kg/m.  Estimated Nutritional Needs:   Kcal:  1900-2100  Protein:   90-110 grams  Fluid:  1.9 - 2.1 L/day   Corrin Parker, MS, RD, LDN Pager # [phone] After hours/ weekend pager # [phone]

## 2018-07-28 NOTE — Progress Notes (Signed)
[facility] PROGRESS NOTE    Progress Note  [name]  ZOX:[id number] DOB: 17-Dec-1970 DOA: 07/22/2018 PCP: [name], NP     Brief Narrative:   Corey Li is an 48 y.o. male past medical history diabetes mellitus type 2 status post recent laminectomy on 07/16/2018 presenting with altered mental status and hypoxia with some right-sided weakness to the ED code stroke was called.  Continue PCCM service intubated, not a candidate for TPA extubated on 07/25/2018 transferred to [facility] on 07/26/2018.  Assessment/Plan:  B/lA bilateral ischemic infarcts largest bilateral PCA territory including the left Acha, bilateral punctuate MCA and bilateral ACA infarcts likely embolic from cardioembolic source versus hypercoagulable state with DKA: MRI of the brain showed multi-ischemic insults in multiple vascular territories largest in the PCA territory but including the left Acha, bilateral MCA infarcts. 2D echo showed an EF of 50% negative bubble study. Lower extremity Doppler was negative. LDL was 72 hemoglobin A1c was 10.9 urine drug screen was negative. Cont high dose statins. Now on aspirin 325, which she will continue as an outpatient. Neurology was consulted who recommended a cardiac event monitor versus loop recorder to rule out A. Fib as an outpatient. Swallowing evaluation was performed and recommended that n.p.o. due to the patient being high risk for aspiration.  Core track was placed will consult nutrition to start enteral feedings. Will discuss with speech therapy to see if she wants a repeat the swallowing evaluation or barium swallow study. Physical therapy evaluated the patient and recommended skilled nursing facility. Consult Child psychotherapist for skilled nursing facility placement.  Acute respiratory failure with hypoxia possibly due to aspiration pneumonia: Patient extubated on 07/25/2018 transferred to [facility] on 07/26/2018. He is low probability for COVID-19. A  CTA of the head and neck showed bilateral apices groundglass opacity, there was a concern of mucous plugging on 07/23/2018. Likely due to aspiration pneumonia change antibiotic coverage to IV Unasyn,  Chest x-ray mild opacity at the bases.  DKA/diabetes mellitus type 2: Now off the insulin drip bicarb greater than 20 anion gap is closed. A1c of 10.9. Only on Levemir and sliding scale insulin with good control.  Will need to follow-up with PCP as an outpatient.  Acute kidney injury: Likely prerenal azotemia resolved with IV fluid hydration creatinine now at baseline.  Acute metabolic encephalopathy/acute confusional state: EKG performed showed a QTC of 477. Started on Haldol IV as needed. 1 out of 2 blood culture positive for staph coag negative staph. RSVP negative.  Essential hypertension: Continue amlodipine continue to monitor blood pressure.  Hyperlipidemia: Continue Lipitor, LDL greater than 80 Continue statins discharge.  Tobacco abuse  Current smoker.  Hypophosphatemia: Replete foot phosphorus orally, once the feeding tube is placed.  Disposition: Seems to be a difficult situation as patient does not have kids he is not married, his parents are both deceased, he has 2 siblings which he is estranged to, have spoken to a friend who is the emergency contact in the computer, she lives in [place]). Consult to Child psychotherapist for skilled nursing facility placement.  DVT prophylaxis: lovenox Family Communication:none Disposition Plan/Barrier to D/C: unable to detemrine Code Status:     Code Status Orders  (From admission, onward)         Start     Ordered   07/22/18 1510  Full code  Continuous     07/22/18 1512        Code Status History    Date Active Date  Inactive Code Status Order ID Comments User Context   07/22/2018 1433 07/22/2018 1512 Full Code [id number]  Lanier Clam, NP ED   07/15/2018 1818 07/18/2018 1912 Full Code [id number]  Georga Bora, PA-C  Inpatient    Advance Directive Documentation     Most Recent Value  Type of Advance Directive  Living will, Healthcare Power of Attorney  Pre-existing out of facility DNR order (yellow form or pink MOST form)  -  "MOST" Form in Place?  -        IV Access:    Peripheral IV   Procedures and diagnostic studies:   No results found.   Medical Consultants:    None.  Anti-Infectives:   Rocephin  Subjective:    Corey Li with a broken or garbled speech.  Objective:    Vitals:   07/27/18 2340 07/28/18 0419 07/28/18 0815 07/28/18 0924  BP: (!) 144/76 (!) 155/90 (!) 142/77   Pulse: 92 78 77   Resp: Temp: 97.6 F (36.4 C) 97.6 F (36.4 C) (!) 97.5 F (36.4 C)   TempSrc: Axillary Oral Oral   SpO2: 98% 99% 98% 99%  Weight:      Height:        Intake/Output Summary (Last 24 hours) at 07/28/2018 1056 Last data filed at 07/28/2018 0428 Gross per 24 hour  Intake 2553.54 ml  Output 2350 ml  Net 203.54 ml   Filed Weights   07/25/18 0500 07/26/18 0500 07/27/18 0500  Weight: 75.2 kg 71.7 kg 71.9 kg    Exam: General exam: In no acute distress. Respiratory system: Good air movement and clear to auscultation. Cardiovascular system: S1 & S2 heard, RRR.  Gastrointestinal system: Abdomen is nondistended, soft and nontender.  Central nervous system: He opens his eyes is not oriented to place or person has mild dysarthria he is not cooperating pupils equally round and reactive non-cooperating he has a right facial droop tongue is midline his left side is intact but his right upper and lower extremity is about 3 out of 5. Extremities: No pedal edema. Skin: No rashes, lesions or ulcers   Data Reviewed:    Labs: Basic Metabolic Panel: Recent Labs  Lab 07/23/18 0448 07/23/18 0834  07/23/18 1358 07/24/18 0014 07/25/18 0350 07/26/18 0549 07/27/18 0511 07/28/18 0431  NA 140 143   < > 143 140 140 136  --  135  K 3.7 3.5   < > 3.7 3.6 3.6 3.7  --  3.7   CL 103 104  --   --  103 103 97*  --  102  CO2 24 26  --   --  --  25  GLUCOSE 224* 101*  --   --  166* 211* 114*  --  150*  BUN 53* 47*  --   --  24* 21* 12  --  16  CREATININE 1.95* 1.74*  --   --  1.15 0.85 0.76  --  0.85  CALCIUM 9.0 8.9  --   --  8.4* 8.3* 9.4  --  8.6*  MG 2.4  --   --   --  2.2  --  2.2 2.2 2.2  PHOS <1.0* <1.0*  --   --  3.1  3.2 1.6* 2.0* 2.2* 2.6   < > = values in this interval not displayed.   GFR Estimated Creatinine Clearance: 105.1 mL/min (by C-G formula based on SCr of 0.85 mg/dL). Liver Function  Tests: Recent Labs  Lab 07/22/18 1234 07/24/18 0014 07/25/18 0350 07/26/18 0549  AST 40  --   --  44*  ALT 35  --   --  25  ALKPHOS 121  --   --  93  BILITOT 2.1*  --   --  0.7  PROT 8.1  --   --  6.7  ALBUMIN 4.1 3.1* 2.7* 2.9*   Recent Labs  Lab 07/22/18 1627  LIPASE 73*  AMYLASE 172*   No results for input(s): AMMONIA in the last 168 hours. Coagulation profile Recent Labs  Lab 07/22/18 1234  INR 1.2    CBC: Recent Labs  Lab 07/22/18 1234  07/24/18 0014 07/25/18 0350 07/26/18 0549 07/27/18 0511 07/28/18 0431  WBC 18.2*   < > 21.6* 14.2* 16.5* 14.4* 13.6*  NEUTROABS 15.3*  --   --   --  14.1* 11.9* 10.6*  HGB 10.7*   < > 9.3* 9.3* 10.0* 10.4* 10.0*  HCT 35.8*   < > 25.7* 27.2* 30.7* 31.2* 29.5*  MCV 104.1*   < > 89.2 91.6 91.6 90.4 89.7  PLT 482*   < > 295 290 329 405* 506*   < > = values in this interval not displayed.   Cardiac Enzymes: Recent Labs  Lab 07/22/18 1627  TROPONINI <0.03   BNP (last 3 results) No results for input(s): PROBNP in the last 8760 hours. CBG: Recent Labs  Lab 07/27/18 2337 07/28/18 0419 07/28/18 0805 07/28/18 0809 07/28/18 0849  GLUCAP 231* 158* 38* 31* 121*   D-Dimer: No results for input(s): DDIMER in the last 72 hours. Hgb A1c: No results for input(s): HGBA1C in the last 72 hours. Lipid Profile: No results for input(s): CHOL, HDL, LDLCALC, TRIG, CHOLHDL, LDLDIRECT in the  last 72 hours. Thyroid function studies: No results for input(s): TSH, T4TOTAL, T3FREE, THYROIDAB in the last 72 hours.  Invalid input(s): FREET3 Anemia work up: No results for input(s): VITAMINB12, FOLATE, FERRITIN, TIBC, IRON, RETICCTPCT in the last 72 hours. Sepsis Labs: Recent Labs  Lab 07/22/18 1331 07/22/18 1627  07/25/18 0350 07/26/18 0549 07/27/18 0511 07/28/18 0431  PROCALCITON  --  0.77  --   --   --   --   --   WBC  --   --    < > 14.2* 16.5* 14.4* 13.6*  LATICACIDVEN 2.9* 2.2*  --   --   --   --   --    < > = values in this interval not displayed.   Microbiology Recent Results (from the past 240 hour(s))  Urine culture     Status: None   Collection Time: 07/22/18  1:42 PM  Result Value Ref Range Status   Specimen Description URINE, RANDOM  Final   Special Requests NONE  Final   Culture   Final    NO GROWTH Performed at [facility], [address], [place], [place] [address]    Report Status 07/23/2018 FINAL  Final  Blood culture (routine x 2)     Status: Abnormal   Collection Time: 07/22/18  2:29 PM  Result Value Ref Range Status   Specimen Description BLOOD LEFT ANTECUBITAL  Final   Special Requests   Final    BOTTLES DRAWN AEROBIC AND ANAEROBIC Blood Culture adequate volume   Culture  Setup Time   Final    GRAM POSITIVE COCCI IN BOTH AEROBIC AND ANAEROBIC BOTTLES Organism ID to follow CRITICAL RESULT CALLED TO, READ BACK BY AND VERIFIED  WITH: Babs Sciara PharmD 11:50 07/23/18 ([email or web address])    Culture (A)  Final    STAPHYLOCOCCUS SPECIES (COAGULASE NEGATIVE) THE SIGNIFICANCE OF ISOLATING THIS ORGANISM FROM A SINGLE SET OF BLOOD CULTURES WHEN MULTIPLE SETS ARE DRAWN IS UNCERTAIN. PLEASE NOTIFY THE MICROBIOLOGY DEPARTMENT WITHIN ONE WEEK IF SPECIATION AND SENSITIVITIES ARE REQUIRED. Performed at [facility], [address] [place], [place] [address]    Report Status 07/25/2018 FINAL  Final  Blood Culture ID Panel (Reflexed)     Status: Abnormal    Collection Time: 07/22/18  2:29 PM  Result Value Ref Range Status   Enterococcus species NOT DETECTED NOT DETECTED Final   Listeria monocytogenes NOT DETECTED NOT DETECTED Final   Staphylococcus species DETECTED (A) NOT DETECTED Final    Comment: Methicillin (oxacillin) resistant coagulase negative staphylococcus. Possible blood culture contaminant (unless isolated from more than one blood culture draw or clinical case suggests pathogenicity). No antibiotic treatment is indicated for blood  culture contaminants. CRITICAL RESULT CALLED TO, READ BACK BY AND VERIFIED WITH: Babs Sciara PharmD 11:50 07/23/18 ([email or web address])    Staphylococcus aureus (BCID) NOT DETECTED NOT DETECTED Final   Methicillin resistance DETECTED (A) NOT DETECTED Final    Comment: CRITICAL RESULT CALLED TO, READ BACK BY AND VERIFIED WITH: Babs Sciara PharmD 11:50 07/23/18 ([email or web address])    Streptococcus species NOT DETECTED NOT DETECTED Final   Streptococcus agalactiae NOT DETECTED NOT DETECTED Final   Streptococcus pneumoniae NOT DETECTED NOT DETECTED Final   Streptococcus pyogenes NOT DETECTED NOT DETECTED Final   Acinetobacter baumannii NOT DETECTED NOT DETECTED Final   Enterobacteriaceae species NOT DETECTED NOT DETECTED Final   Enterobacter cloacae complex NOT DETECTED NOT DETECTED Final   Escherichia coli NOT DETECTED NOT DETECTED Final   Klebsiella oxytoca NOT DETECTED NOT DETECTED Final   Klebsiella pneumoniae NOT DETECTED NOT DETECTED Final   Proteus species NOT DETECTED NOT DETECTED Final   Serratia marcescens NOT DETECTED NOT DETECTED Final   Haemophilus influenzae NOT DETECTED NOT DETECTED Final   Neisseria meningitidis NOT DETECTED NOT DETECTED Final   Pseudomonas aeruginosa NOT DETECTED NOT DETECTED Final   Candida albicans NOT DETECTED NOT DETECTED Final   Candida glabrata NOT DETECTED NOT DETECTED Final   Candida krusei NOT DETECTED NOT DETECTED Final   Candida parapsilosis NOT DETECTED NOT DETECTED Final    Candida tropicalis NOT DETECTED NOT DETECTED Final    Comment: Performed at [facility], [address], [place], [place] [address]  Blood culture (routine x 2)     Status: None   Collection Time: 07/22/18  4:26 PM  Result Value Ref Range Status   Specimen Description BLOOD RIGHT ANTECUBITAL  Final   Special Requests   Final    BOTTLES DRAWN AEROBIC ONLY Blood Culture adequate volume   Culture   Final    NO GROWTH 5 DAYS Performed at [facility], [address] [place], [place] [address]    Report Status 07/27/2018 FINAL  Final  Respiratory Panel by PCR     Status: None   Collection Time: 07/22/18  5:08 PM  Result Value Ref Range Status   Adenovirus NOT DETECTED NOT DETECTED Final   Coronavirus 229E NOT DETECTED NOT DETECTED Final    Comment: (NOTE) The Coronavirus on the Respiratory Panel, DOES NOT test for the novel  Coronavirus (2019 nCoV)    Coronavirus HKU1 NOT DETECTED NOT DETECTED Final   Coronavirus NL63 NOT DETECTED NOT DETECTED Final   Coronavirus OC43 NOT  DETECTED NOT DETECTED Final   Metapneumovirus NOT DETECTED NOT DETECTED Final   Rhinovirus / Enterovirus NOT DETECTED NOT DETECTED Final   Influenza A NOT DETECTED NOT DETECTED Final   Influenza B NOT DETECTED NOT DETECTED Final   Parainfluenza Virus 1 NOT DETECTED NOT DETECTED Final   Parainfluenza Virus 2 NOT DETECTED NOT DETECTED Final   Parainfluenza Virus 3 NOT DETECTED NOT DETECTED Final   Parainfluenza Virus 4 NOT DETECTED NOT DETECTED Final   Respiratory Syncytial Virus NOT DETECTED NOT DETECTED Final   Bordetella pertussis NOT DETECTED NOT DETECTED Final   Chlamydophila pneumoniae NOT DETECTED NOT DETECTED Final   Mycoplasma pneumoniae NOT DETECTED NOT DETECTED Final    Comment: Performed at [facility], [address] [place], [place] [address]  MRSA PCR Screening     Status: None   Collection Time: 07/23/18  9:31 AM  Result Value Ref Range Status   MRSA by PCR NEGATIVE NEGATIVE  Final    Comment:        The GeneXpert MRSA Assay (FDA approved for NASAL specimens only), is one component of a comprehensive MRSA colonization surveillance program. It is not intended to diagnose MRSA infection nor to guide or monitor treatment for MRSA infections. Performed at [facility], [address] [place], [place] [address]   Culture, respiratory (non-expectorated)     Status: None   Collection Time: 07/23/18 11:42 AM  Result Value Ref Range Status   Specimen Description TRACHEAL ASPIRATE  Final   Special Requests NONE  Final   Gram Stain   Final    NO WBC SEEN FEW BUDDING YEAST SEEN FEW GRAM POSITIVE RODS FEW GRAM POSITIVE COCCI IN PAIRS    Culture   Final    MODERATE CANDIDA TROPICALIS MODERATE GROUP B STREP(S.AGALACTIAE)ISOLATED TESTING AGAINST S. AGALACTIAE NOT ROUTINELY PERFORMED DUE TO PREDICTABILITY OF AMP/PEN/VAN SUSCEPTIBILITY. Performed at [facility], [address] [place], [place] [address]    Report Status 07/25/2018 FINAL  Final     Medications:   . amLODipine  10 mg Oral Daily  . aspirin EC  325 mg Oral Daily   Or  . aspirin  300 mg Rectal Daily  . atorvastatin  80 mg Oral Daily  . enoxaparin (LOVENOX) injection  40 mg Subcutaneous Q24H  . etomidate  20 mg Intravenous Once  . feeding supplement (GLUCERNA 1.2 CAL)  480 mL Per Tube TID  . feeding supplement (PRO-STAT SUGAR FREE 64)  30 mL Per Tube BID  . insulin aspart  3-9 Units Subcutaneous Q4H  . insulin detemir  18 Units Subcutaneous Q12H  . ipratropium-albuterol  3 mL Nebulization BID  . levothyroxine  175 mcg Per Tube QAC breakfast  . lisinopril  20 mg Oral Daily  . mouth rinse  15 mL Mouth Rinse BID  . rocuronium  100 mg Intravenous Once   Continuous Infusions: . sodium chloride 10 mL/hr at 07/23/18 0700  . sodium chloride 100 mL/hr at 07/28/18 0400  . ampicillin-sulbactam (UNASYN) IV 1.5 g (07/28/18 0921)      LOS: 6 days   Marinda ElkAbraham Feliz Ortiz  [facility]  07/28/2018, 10:56 AM

## 2018-07-29 LAB — BASIC METABOLIC PANEL
Anion gap: 9 (ref 5–15)
BUN: 18 mg/dL (ref 6–20)
CO2: 27 mmol/L (ref 22–32)
Calcium: 8.7 mg/dL — ABNORMAL LOW (ref 8.9–10.3)
Chloride: 101 mmol/L (ref 98–111)
Creatinine, Ser: 0.76 mg/dL (ref 0.61–1.24)
GFR calc Af Amer: >60 mL/min (ref >60)
GFR calc non Af Amer: >60 mL/min (ref >60)
Glucose, Bld: 70 mg/dL (ref 70–99)
Potassium: 3.7 mmol/L (ref 3.5–5.1)
Sodium: 137 mmol/L (ref 135–145)

## 2018-07-29 LAB — CBC WITH DIFFERENTIAL/PLATELET
Abs Immature Granulocytes: 0.25 10*3/uL — ABNORMAL HIGH (ref 0.00–0.07)
Basophils Absolute: 0.1 10*3/uL (ref 0.0–0.1)
Basophils Relative: 1 %
Eosinophils Absolute: 0.9 10*3/uL — ABNORMAL HIGH (ref 0.0–0.5)
Eosinophils Relative: 8 %
HCT: 27.2 % — ABNORMAL LOW (ref 39.0–52.0)
Hemoglobin: 9.3 g/dL — ABNORMAL LOW (ref 13.0–17.0)
Immature Granulocytes: 2 %
Lymphocytes Relative: 12 %
Lymphs Abs: 1.4 10*3/uL (ref 0.7–4.0)
MCH: 31 pg (ref 26.0–34.0)
MCHC: 34.2 g/dL (ref 30.0–36.0)
MCV: 90.7 fL (ref 80.0–100.0)
Monocytes Absolute: 1.4 10*3/uL — ABNORMAL HIGH (ref 0.1–1.0)
Monocytes Relative: 12 %
Neutro Abs: 7.6 10*3/uL (ref 1.7–7.7)
Neutrophils Relative %: 65 %
Platelets: 590 10*3/uL — ABNORMAL HIGH (ref 150–400)
RBC: 3 MIL/uL — ABNORMAL LOW (ref 4.22–5.81)
RDW: 14.1 % (ref 11.5–15.5)
WBC: 11.6 10*3/uL — ABNORMAL HIGH (ref 4.0–10.5)
nRBC: 0 % (ref 0.0–0.2)

## 2018-07-29 LAB — GLUCOSE, CAPILLARY
Glucose-Capillary: 127 mg/dL — ABNORMAL HIGH (ref 70–99)
Glucose-Capillary: 175 mg/dL — ABNORMAL HIGH (ref 70–99)
Glucose-Capillary: 264 mg/dL — ABNORMAL HIGH (ref 70–99)
Glucose-Capillary: 71 mg/dL (ref 70–99)
Glucose-Capillary: 98 mg/dL (ref 70–99)
Glucose-Capillary: 99 mg/dL (ref 70–99)

## 2018-07-29 LAB — PROTIME-INR
INR: 1 (ref 0.8–1.2)
Prothrombin Time: 12.8 seconds (ref 11.4–15.2)

## 2018-07-29 LAB — MAGNESIUM: Magnesium: 2.3 mg/dL (ref 1.7–2.4)

## 2018-07-29 LAB — PHOSPHORUS: Phosphorus: 2.7 mg/dL (ref 2.5–4.6)

## 2018-07-29 MED ORDER — INSULIN ASPART 100 UNIT/ML ~~LOC~~ SOLN
0.0000 [IU] | SUBCUTANEOUS | Status: DC
  Administered 2018-07-29: 11 [IU] via SUBCUTANEOUS
  Administered 2018-07-29: 4 [IU] via SUBCUTANEOUS
  Administered 2018-07-30 (×2): 7 [IU] via SUBCUTANEOUS
  Administered 2018-07-30 – 2018-07-31 (×2): 3 [IU] via SUBCUTANEOUS
  Administered 2018-07-31: 4 [IU] via SUBCUTANEOUS
  Administered 2018-07-31: 3 [IU] via SUBCUTANEOUS
  Administered 2018-08-01: 4 [IU] via SUBCUTANEOUS

## 2018-07-29 MED ORDER — INSULIN DETEMIR 100 UNIT/ML ~~LOC~~ SOLN
15.0000 [IU] | Freq: Two times a day (BID) | SUBCUTANEOUS | Status: DC
  Administered 2018-07-29 – 2018-07-31 (×4): 15 [IU] via SUBCUTANEOUS
  Filled 2018-07-29 (×7): qty 0.15

## 2018-07-29 MED ORDER — ENOXAPARIN SODIUM 40 MG/0.4ML ~~LOC~~ SOLN
40.0000 mg | SUBCUTANEOUS | Status: DC
  Administered 2018-07-31 – 2018-08-01 (×2): 40 mg via SUBCUTANEOUS
  Filled 2018-07-29 (×2): qty 0.4

## 2018-07-29 MED ORDER — INSULIN ASPART 100 UNIT/ML ~~LOC~~ SOLN
3.0000 [IU] | Freq: Three times a day (TID) | SUBCUTANEOUS | Status: DC
  Administered 2018-07-29 – 2018-07-30 (×4): 3 [IU] via SUBCUTANEOUS

## 2018-07-29 NOTE — Progress Notes (Signed)
BG at 0400 was 71, Pt was given of Glucerna

## 2018-07-29 NOTE — Progress Notes (Signed)
    Patient sitting at edge of bed with OT and PT Appears comfortable   Physical Exam: Vitals:   07/29/18 0403 07/29/18 0737  BP: (!) 155/83 (!) 158/89  Pulse: 82 76  Resp: 19 20  Temp: 97.6 F (36.4 C)   SpO2: 99% 98%   Patient responding to questions No cervical collar applied Still no strength throughout right arm and leg. + 4+/5 grip to left hand. Anterior and posterior incisions well healed  From standpoint of patient cervical surgery, patient will need a cervical collar. I did place an order for this.   - PT/OT to continue to work on mobility and transfers - will need to continue to awaiting any gains in strength - patient's posterior wound healing well, stitch will be removed tomorrow  - additional management per medical team - will continue to follow

## 2018-07-29 NOTE — Progress Notes (Addendum)
Occupational Therapy Treatment Patient Details Name: Corey Li MRN: [id number] DOB: 1970/12/23 Today's Date: 07/29/2018    History of present illness Pt is a 48 yo male s/p multifocal infarcts, bacteremia, sepsis and DKA. Pt PMHx: 4/1 A/P CDF C5-7, DM, anxiety, depression, HTN.   OT comments  Patient seen for pt/ot cotx.  Requires max-total assist +2 for bed mobility, total assist for hygiene after incontinent BM from bed level.  Sitting EOB continues to fluctuate assist from min to max assist at times with R lateral lean.  Improving participation and attempting to verbalize more during session, follow commands with L UE throughout session. All mobility and ADLs completed with protection to cervical spine.   Dr. Yevette Edwards present during session, ok with limited bed mobility without soft collar but reported preference to hard collar to protect neck- RN notified. Continues to remain flaccid on R side, slow processing and delayed response time.    Follow Up Recommendations  SNF;Supervision/Assistance - 24 hour    Equipment Recommendations  Other (comment)(TBD at next venue of care )    Recommendations for Other Services      Precautions / Restrictions Precautions Precautions: Cervical;Fall Precaution Booklet Issued: Yes (comment) Precaution Comments: cortrak placed 4/13 Required Braces or Orthoses: Cervical Brace Cervical Brace: Soft collar Restrictions Weight Bearing Restrictions: No       Mobility Bed Mobility Overal bed mobility: Needs Assistance Bed Mobility: Rolling;Sidelying to Sit;Sit to Sidelying Rolling: Max assist;+2 for safety/equipment Sidelying to sit: Max assist;+2 for physical assistance;HOB elevated     Sit to sidelying: Total assist;+2 for physical assistance General bed mobility comments: continues to require cueing for sequencing and techniques, rolling towards L side with max assist and transition to EOB with max assist; total assist to return to  slidelying/supine  Transfers                 General transfer comment: deferred due to safety     Balance Overall balance assessment: Needs assistance Sitting-balance support: Single extremity supported;Feet supported;Bilateral upper extremity supported Sitting balance-Leahy Scale: Poor Sitting balance - Comments: sitting EOB with fluctuating assistance min to max assist at times Postural control: Right lateral lean                                 ADL either performed or assessed with clinical judgement   ADL Overall ADL's : Needs assistance/impaired Eating/Feeding: NPO   Grooming: Wash/dry face;Wash/dry hands;Sitting;Bed level;Moderate assistance Grooming Details (indicate cue type and reason): pt able to wash face using L hand after initation of task with hand over hand support, total assist to wash hands          Upper Body Dressing : Total assistance;Bed level Upper Body Dressing Details (indicate cue type and reason): total assist to change gown          Toileting- Clothing Manipulation and Hygiene: Total assistance;+2 for physical assistance;+2 for safety/equipment;Bed level Toileting - Clothing Manipulation Details (indicate cue type and reason): total assist for peri care after incotinent of bowel      Functional mobility during ADLs: Maximal assistance;Total assistance;+2 for physical assistance;+2 for safety/equipment General ADL Comments: limited by cognition, R hemiparesis, decreased balance      Vision       Perception     Praxis      Cognition Arousal/Alertness: Lethargic Behavior During Therapy: Flat affect Overall Cognitive Status: Impaired/Different from baseline Area of Impairment: Attention;Following commands;Safety/judgement;Awareness;Problem  solving                   Current Attention Level: Focused   Following Commands: Follows one step commands inconsistently;Follows one step commands with increased  time Safety/Judgement: Decreased awareness of safety Awareness: Intellectual Problem Solving: Slow processing;Decreased initiation;Difficulty sequencing;Requires tactile cues;Requires verbal cues General Comments: pt able to follow some commands with L UE, requires increased time and multimodal cueing; decreased problem sovling and awareness; lethargic         Exercises     Shoulder Instructions       General Comments      Pertinent Vitals/ Pain       Pain Assessment: Faces Faces Pain Scale: Hurts a little bit Pain Location: generalized Pain Descriptors / Indicators: Discomfort;Grimacing;Guarding Pain Intervention(s): Monitored during session;Repositioned  Home Living                                          Prior Functioning/Environment              Frequency  Min 3X/week        Progress Toward Goals  OT Goals(current goals can now be found in the care plan section)  Progress towards OT goals: Progressing toward goals  Acute Rehab OT Goals Patient Stated Goal: unable to state  Plan Frequency remains appropriate;Discharge plan remains appropriate    Co-evaluation    PT/OT/SLP Co-Evaluation/Treatment: Yes Reason for Co-Treatment: Complexity of the patient's impairments (multi-system involvement);To address functional/ADL transfers   OT goals addressed during session: ADL's and self-care      AM-PAC OT "6 Clicks" Daily Activity     Outcome Measure   Help from another person eating meals?: Total Help from another person taking care of personal grooming?: A Lot Help from another person toileting, which includes using toliet, bedpan, or urinal?: Total Help from another person bathing (including washing, rinsing, drying)?: Total Help from another person to put on and taking off regular upper body clothing?: Total Help from another person to put on and taking off regular lower body clothing?: Total 6 Click Score: 7    End of Session  Equipment Utilized During Treatment: Oxygen  OT Visit Diagnosis: Unsteadiness on feet (R26.81);Muscle weakness (generalized) (M62.81);Hemiplegia and hemiparesis;Other symptoms and signs involving cognitive function;Other symptoms and signs involving the nervous system (R29.898) Hemiplegia - Right/Left: Right Hemiplegia - dominant/non-dominant: Dominant Hemiplegia - caused by: Cerebral infarction   Activity Tolerance Patient limited by lethargy   Patient Left in bed;with call bell/phone within reach;with bed alarm set   Nurse Communication Other (comment);Mobility status(cervical collar )        Time: [id number] OT Time Calculation (min): 32 min  Charges: OT General Charges $OT Visit: 1 Visit OT Treatments $Self Care/Home Management : 8-22 mins  Chancy Milroyhristie S Zhavia Cunanan, OT Acute Rehabilitation Services Pager [phone] Office [phone]    Chancy MilroyChristie S Vonya Ohalloran 07/29/2018, 11:59 AM

## 2018-07-29 NOTE — Progress Notes (Signed)
Physical Therapy Treatment Patient Details Name: Corey Li Antilla MRN: [id number] DOB: 1971/01/17 Today's Date: 07/29/2018    History of Present Illness Pt is a 48 yo male s/p multifocal infarcts, bacteremia, sepsis and DKA. Pt PMHx: 4/1 A/P CDF C5-7, DM, anxiety, depression, HTN.    PT Comments    Patient seen for mobility progression. Pt continues to present with flaccid R side and delayed processing. Pt requires +2 for bed mobility and +1 for sitting balance EOB. Pt is able to maintain sitting balance with min A at times with static sitting. Dr. Yevette Edwardsumonski present during session and ok with limited bed mobility without soft collar but reported preference to hard collar for better protection of recent A/P CDF- RN notified. Will wait for C collar to attempt OOB mobility. Continue to progress as tolerated.   Follow Up Recommendations  SNF     Equipment Recommendations  Other (comment)(TBD next session)    Recommendations for Other Services       Precautions / Restrictions Precautions Precautions: Cervical;Fall Precaution Booklet Issued: Yes (comment) Precaution Comments: cortrak placed 4/13 Required Braces or Orthoses: Cervical Brace Cervical Brace: Other (comment)(C collar to be ordered by Dr. Yevette Edwardsumonski) Restrictions Weight Bearing Restrictions: No    Mobility  Bed Mobility Overal bed mobility: Needs Assistance Bed Mobility: Rolling;Sidelying to Sit;Sit to Sidelying Rolling: Max assist;+2 for safety/equipment Sidelying to sit: Max assist;+2 for physical assistance;HOB elevated     Sit to sidelying: Total assist;+2 for physical assistance General bed mobility comments: continues to require cueing for sequencing and techniques, rolling towards L side with max assist and transition to EOB with max assist; total assist to return to slidelying/supine  Transfers                 General transfer comment: deferred due to safety   Ambulation/Gait                  Stairs             Wheelchair Mobility    Modified Rankin (Stroke Patients Only)       Balance Overall balance assessment: Needs assistance Sitting-balance support: Single extremity supported;Feet supported;Bilateral upper extremity supported Sitting balance-Leahy Scale: Poor Sitting balance - Comments: sitting EOB with fluctuating assistance min to max assist at times Postural control: Right lateral lean                                  Cognition Arousal/Alertness: Lethargic Behavior During Therapy: Flat affect Overall Cognitive Status: Impaired/Different from baseline Area of Impairment: Attention;Following commands;Safety/judgement;Awareness;Problem solving                   Current Attention Level: Focused   Following Commands: Follows one step commands inconsistently;Follows one step commands with increased time Safety/Judgement: Decreased awareness of safety Awareness: Intellectual Problem Solving: Slow processing;Decreased initiation;Difficulty sequencing;Requires tactile cues;Requires verbal cues General Comments: pt able to follow some commands with L UE, requires increased time and multimodal cueing; decreased problem sovling and awareness; lethargic       Exercises      General Comments        Pertinent Vitals/Pain Pain Assessment: Faces Faces Pain Scale: Hurts a little bit Pain Location: generalized Pain Descriptors / Indicators: Discomfort;Grimacing;Guarding Pain Intervention(s): Monitored during session;Repositioned;Limited activity within patient's tolerance    Home Living  Prior Function            PT Goals (current goals can now be found in the care plan section) Acute Rehab PT Goals Patient Stated Goal: unable to state Progress towards PT goals: Progressing toward goals    Frequency    Min 4X/week      PT Plan Current plan remains appropriate    Co-evaluation PT/OT/SLP  Co-Evaluation/Treatment: Yes Reason for Co-Treatment: Complexity of the patient's impairments (multi-system involvement);For patient/therapist safety;To address functional/ADL transfers PT goals addressed during session: Mobility/safety with mobility;Balance;Strengthening/ROM        AM-PAC PT "6 Clicks" Mobility   Outcome Measure  Help needed turning from your back to your side while in a flat bed without using bedrails?: Total Help needed moving from lying on your back to sitting on the side of a flat bed without using bedrails?: Total Help needed moving to and from a bed to a chair (including a wheelchair)?: Total Help needed standing up from a chair using your arms (e.g., wheelchair or bedside chair)?: Total Help needed to walk in hospital room?: Total Help needed climbing 3-5 steps with a railing? : Total 6 Click Score: 6    End of Session Equipment Utilized During Treatment: Oxygen Activity Tolerance: Patient tolerated treatment well Patient left: in bed;with call bell/phone within reach;with bed alarm set Nurse Communication: Mobility status;Precautions;Other (comment)(C collar) PT Visit Diagnosis: Other abnormalities of gait and mobility (R26.89);Pain Pain - Right/Left: Left Pain - part of body: Arm     Time: 8099-8338 PT Time Calculation (min) (ACUTE ONLY): 32 min  Charges:  $Therapeutic Activity: 8-22 mins                     Erline Levine, PTA Acute Rehabilitation Services Pager: [phone] Office: [phone]     Carolynne Edouard 07/29/2018, 3:35 PM

## 2018-07-29 NOTE — Progress Notes (Signed)
PROGRESS NOTE  Corey Li  ZOX:[id number] DOB: 1970-09-13 DOA: 07/22/2018 PCP: Lavinia SharpsPlacey, Mary Ann, NP   Brief Narrative: Corey Li is a 48 y.o. male with a history of T2DM, HTN who underwent recent A/P CDF (C5-7) on 4/2 who presented with altered mental status and hypoxia with some right-sided weakness to the ED code stroke was called. He appeared septic and had DKA, admitted to ICU, intubated and ultimately extubated on 07/25/2018 transferred to [facility] on 07/26/2018. Therapy evaluations have recommended SNF for placement at discharge, but will require PEG placement, planned 4/16.  Assessment & Plan: Active Problems:   DKA (diabetic ketoacidoses) (HCC)   Encounter for intubation   Acute respiratory failure with hypoxia (HCC)   Cerebral embolism with cerebral infarction  Bilateral ischemic infarcts: Largest bilateral PCA territory including the left ACA, bilateral punctuate MCA and bilateral ACA infarcts likely embolic from cardioembolic source versus hypercoagulable state with DKA: 2D echo showed an EF of 50% negative bubble study. Lower extremity Doppler was negative. - Continue statin, LDL 72 - DM management as below - Normotension is now BP goal.  - Continue ASA 325mg  daily - Neurology was consulted who recommended a cardiac event monitor versus loop recorder to rule out A. Fib as an outpatient. - Consult Child psychotherapistsocial worker for skilled nursing facility placement.  Dysphagia: Very high risk aspiration, complication of CVA:  - Needs PEG placement. IR has obtained consent from only available contact, NPO p MN, hold lovenox.   Acute respiratory failure with hypoxia possibly due to aspiration pneumonia: Patient extubated on 07/25/2018 transferred to [facility] hospitalist on 07/26/2018. He is low probability for COVID-19. A CTA of the head and neck showed bilateral apices groundglass opacity, there was a concern of mucous plugging on 07/23/2018. Chest x-ray mild opacity at the bases. - Continue  unasyn x5 days, WBC improving, 4/13 - 4/17  History of anterior/posterior C5-C7 decompression/fusion:  - Dr. Yevette Edwardsumonski has followed in the hospital. Appreciate recommendations. Incisions look healthy, continue cervical collar.   DKA, T2DM:HbA1c 10.9%, weaned from insulin gtt.  - Continue levemir and SSI. Some hypoglycemia in AM and over goal during day, will decrease levemir to 15u BID and change to mod SSI and 3u TIDW/bolus feeds.  Acute kidney injury: Likely prerenal azotemia resolved with IV fluid hydration creatinine now at baseline.  Acute metabolic encephalopathy/acute confusional state: - Continue prn haldol  CoNS bacteremia: In 1 out of 2 blood culture collections not consistent with true infection.   Essential hypertension: - Continue amlodipine  Hyperlipidemia: - Continue lipitor  Tobacco abuse: Current smoker. - Cessation counseling to be provided, though will not be able to smoke  Hypophosphatemia: - Replete foot phosphorus orally, once the feeding tube is placed.  DVT prophylaxis: Lovenox Code Status: Full Family Communication: None at bedside Disposition Plan: SNF when bed available and PEG in place. Seems to be a difficult situation as patient does not have kids he is not married, his parents are both deceased, he has 2 siblings which he is estranged to, have spoken to a friend who is the emergency contact in the computer, [place]  Consultants:   Neurology  Orthopedics  CCM  Procedures:   PEG placement planned 4/16.  Antimicrobials:  Ceftriaxone > unasyn 4/13  Subjective: Not cooperative with interview, slow processing times. Denies dyspnea or pain anywhere.   Objective: Vitals:   07/29/18 0001 07/29/18 0403 07/29/18 0737 07/29/18 1147  BP: (!) 148/81 (!) 155/83 (!) 158/89 124/65  Pulse:  85 82 76 84  Resp: (!) 23 19 20 20   Temp: 98 F (36.7 C) 97.6 F (36.4 C)  97.7 F (36.5 C)  TempSrc: Oral Oral  Oral  SpO2:  98% 99% 98% 96%  Weight:      Height:        Intake/Output Summary (Last 24 hours) at 07/29/2018 1542 Last data filed at 07/29/2018 0403 Gross per 24 hour  Intake 2446.57 ml  Output 900 ml  Net 1546.57 ml   Filed Weights   07/25/18 0500 07/26/18 0500 07/27/18 0500  Weight: 75.2 kg 71.7 kg 71.9 kg    Gen: Ill-appearing male in no distress Pulm: Non-labored breathing. Clear to auscultation bilaterally.  CV: Regular rate and rhythm. No murmur, rub, or gallop. No JVD, no pedal edema. GI: Abdomen soft, non-tender, non-distended, with normoactive bowel sounds. No organomegaly or masses felt. Ext: Warm, no deformities Skin: No rashes, lesions or ulcers Neuro: Drowsy, uncooperative with exam, diffusely weak, no movement on right. Psych: Judgement and insight appear normal. Mood & affect appropriate.   Data Reviewed: I have personally reviewed following labs and imaging studies  CBC: Recent Labs  Lab 07/25/18 0350 07/26/18 0549 07/27/18 0511 07/28/18 0431 07/29/18 0342  WBC 14.2* 16.5* 14.4* 13.6* 11.6*  NEUTROABS  --  14.1* 11.9* 10.6* 7.6  HGB 9.3* 10.0* 10.4* 10.0* 9.3*  HCT 27.2* 30.7* 31.2* 29.5* 27.2*  MCV 91.6 91.6 90.4 89.7 90.7  PLT 290 329 405* 506* 590*   Basic Metabolic Panel: Recent Labs  Lab 07/24/18 0014 07/25/18 0350 07/26/18 0549 07/27/18 0511 07/28/18 0431 07/29/18 0342  NA 140 140 136  --  135 137  K 3.6 3.6 3.7  --  3.7 3.7  CL 103 103 97*  --  102 101  CO2 25 24 27   --  25 27  GLUCOSE 166* 211* 114*  --  150* 70  BUN 24* 21* 12  --  16 18  CREATININE 1.15 0.85 0.76  --  0.85 0.76  CALCIUM 8.4* 8.3* 9.4  --  8.6* 8.7*  MG 2.2  --  2.2 2.2 2.2 2.3  PHOS 3.1  3.2 1.6* 2.0* 2.2* 2.6 2.7   GFR: Estimated Creatinine Clearance: 111.7 mL/min (by C-G formula based on SCr of 0.76 mg/dL). Liver Function Tests: Recent Labs  Lab 07/24/18 0014 07/25/18 0350 07/26/18 0549  AST  --   --  44*  ALT  --   --  25  ALKPHOS  --   --  93  BILITOT  --   --   0.7  PROT  --   --  6.7  ALBUMIN 3.1* 2.7* 2.9*   Recent Labs  Lab 07/22/18 1627  LIPASE 73*  AMYLASE 172*   No results for input(s): AMMONIA in the last 168 hours. Coagulation Profile: Recent Labs  Lab 07/29/18 1316  INR 1.0   Cardiac Enzymes: Recent Labs  Lab 07/22/18 1627  TROPONINI <0.03   BNP (last 3 results) No results for input(s): PROBNP in the last 8760 hours. HbA1C: No results for input(s): HGBA1C in the last 72 hours. CBG: Recent Labs  Lab 07/28/18 2048 07/28/18 2358 07/29/18 0402 07/29/18 0731 07/29/18 1144  GLUCAP 169* 127* 71 99 264*   Lipid Profile: No results for input(s): CHOL, HDL, LDLCALC, TRIG, CHOLHDL, LDLDIRECT in the last 72 hours. Thyroid Function Tests: No results for input(s): TSH, T4TOTAL, FREET4, T3FREE, THYROIDAB in the last 72 hours. Anemia Panel: No results for input(s): VITAMINB12, FOLATE, FERRITIN,  TIBC, IRON, RETICCTPCT in the last 72 hours. Urine analysis:    Component Value Date/Time   COLORURINE YELLOW 07/22/2018 1234   APPEARANCEUR HAZY (A) 07/22/2018 1234   LABSPEC 1.016 07/22/2018 1234   PHURINE 5.0 07/22/2018 1234   GLUCOSEU >=500 (A) 07/22/2018 1234   HGBUR MODERATE (A) 07/22/2018 1234   BILIRUBINUR NEGATIVE 07/22/2018 1234   KETONESUR 20 (A) 07/22/2018 1234   PROTEINUR 100 (A) 07/22/2018 1234   NITRITE NEGATIVE 07/22/2018 1234   LEUKOCYTESUR NEGATIVE 07/22/2018 1234   Recent Results (from the past 240 hour(s))  Urine culture     Status: None   Collection Time: 07/22/18  1:42 PM  Result Value Ref Range Status   Specimen Description URINE, RANDOM  Final   Special Requests NONE  Final   Culture   Final    NO GROWTH Performed at [facility], [address], [place], [place] [address]    Report Status 07/23/2018 FINAL  Final  Blood culture (routine x 2)     Status: Abnormal   Collection Time: 07/22/18  2:29 PM  Result Value Ref Range Status   Specimen Description BLOOD LEFT ANTECUBITAL  Final    Special Requests   Final    BOTTLES DRAWN AEROBIC AND ANAEROBIC Blood Culture adequate volume   Culture  Setup Time   Final    GRAM POSITIVE COCCI IN BOTH AEROBIC AND ANAEROBIC BOTTLES Organism ID to follow CRITICAL RESULT CALLED TO, READ BACK BY AND VERIFIED WITH: Babs Sciara PharmD 11:50 07/23/18 ([email or web address])    Culture (A)  Final    STAPHYLOCOCCUS SPECIES (COAGULASE NEGATIVE) THE SIGNIFICANCE OF ISOLATING THIS ORGANISM FROM A SINGLE SET OF BLOOD CULTURES WHEN MULTIPLE SETS ARE DRAWN IS UNCERTAIN. PLEASE NOTIFY THE MICROBIOLOGY DEPARTMENT WITHIN ONE WEEK IF SPECIATION AND SENSITIVITIES ARE REQUIRED. Performed at [facility], [address], [place], [place] [address]    Report Status 07/25/2018 FINAL  Final  Blood Culture ID Panel (Reflexed)     Status: Abnormal   Collection Time: 07/22/18  2:29 PM  Result Value Ref Range Status   Enterococcus species NOT DETECTED NOT DETECTED Final   Listeria monocytogenes NOT DETECTED NOT DETECTED Final   Staphylococcus species DETECTED (A) NOT DETECTED Final    Comment: Methicillin (oxacillin) resistant coagulase negative staphylococcus. Possible blood culture contaminant (unless isolated from more than one blood culture draw or clinical case suggests pathogenicity). No antibiotic treatment is indicated for blood  culture contaminants. CRITICAL RESULT CALLED TO, READ BACK BY AND VERIFIED WITH: Babs Sciara PharmD 11:50 07/23/18 ([email or web address])    Staphylococcus aureus (BCID) NOT DETECTED NOT DETECTED Final   Methicillin resistance DETECTED (A) NOT DETECTED Final    Comment: CRITICAL RESULT CALLED TO, READ BACK BY AND VERIFIED WITH: Babs Sciara PharmD 11:50 07/23/18 ([email or web address])    Streptococcus species NOT DETECTED NOT DETECTED Final   Streptococcus agalactiae NOT DETECTED NOT DETECTED Final   Streptococcus pneumoniae NOT DETECTED NOT DETECTED Final   Streptococcus pyogenes NOT DETECTED NOT DETECTED Final   Acinetobacter baumannii NOT DETECTED NOT DETECTED  Final   Enterobacteriaceae species NOT DETECTED NOT DETECTED Final   Enterobacter cloacae complex NOT DETECTED NOT DETECTED Final   Escherichia coli NOT DETECTED NOT DETECTED Final   Klebsiella oxytoca NOT DETECTED NOT DETECTED Final   Klebsiella pneumoniae NOT DETECTED NOT DETECTED Final   Proteus species NOT DETECTED NOT DETECTED Final   Serratia marcescens NOT DETECTED NOT DETECTED Final   Haemophilus influenzae NOT DETECTED NOT DETECTED Final  Neisseria meningitidis NOT DETECTED NOT DETECTED Final   Pseudomonas aeruginosa NOT DETECTED NOT DETECTED Final   Candida albicans NOT DETECTED NOT DETECTED Final   Candida glabrata NOT DETECTED NOT DETECTED Final   Candida krusei NOT DETECTED NOT DETECTED Final   Candida parapsilosis NOT DETECTED NOT DETECTED Final   Candida tropicalis NOT DETECTED NOT DETECTED Final    Comment: Performed at [facility], [address] [place], [place] [address]  Blood culture (routine x 2)     Status: None   Collection Time: 07/22/18  4:26 PM  Result Value Ref Range Status   Specimen Description BLOOD RIGHT ANTECUBITAL  Final   Special Requests   Final    BOTTLES DRAWN AEROBIC ONLY Blood Culture adequate volume   Culture   Final    NO GROWTH 5 DAYS Performed at [facility], [address], [place], [place] [address]    Report Status 07/27/2018 FINAL  Final  Respiratory Panel by PCR     Status: None   Collection Time: 07/22/18  5:08 PM  Result Value Ref Range Status   Adenovirus NOT DETECTED NOT DETECTED Final   Coronavirus 229E NOT DETECTED NOT DETECTED Final    Comment: (NOTE) The Coronavirus on the Respiratory Panel, DOES NOT test for the novel  Coronavirus (2019 nCoV)    Coronavirus HKU1 NOT DETECTED NOT DETECTED Final   Coronavirus NL63 NOT DETECTED NOT DETECTED Final   Coronavirus OC43 NOT DETECTED NOT DETECTED Final   Metapneumovirus NOT DETECTED NOT DETECTED Final   Rhinovirus / Enterovirus NOT DETECTED NOT DETECTED Final    Influenza A NOT DETECTED NOT DETECTED Final   Influenza B NOT DETECTED NOT DETECTED Final   Parainfluenza Virus 1 NOT DETECTED NOT DETECTED Final   Parainfluenza Virus 2 NOT DETECTED NOT DETECTED Final   Parainfluenza Virus 3 NOT DETECTED NOT DETECTED Final   Parainfluenza Virus 4 NOT DETECTED NOT DETECTED Final   Respiratory Syncytial Virus NOT DETECTED NOT DETECTED Final   Bordetella pertussis NOT DETECTED NOT DETECTED Final   Chlamydophila pneumoniae NOT DETECTED NOT DETECTED Final   Mycoplasma pneumoniae NOT DETECTED NOT DETECTED Final    Comment: Performed at [facility] Lab, [address], [place], [place] [address]  MRSA PCR Screening     Status: None   Collection Time: 07/23/18  9:31 AM  Result Value Ref Range Status   MRSA by PCR NEGATIVE NEGATIVE Final    Comment:        The GeneXpert MRSA Assay (FDA approved for NASAL specimens only), is one component of a comprehensive MRSA colonization surveillance program. It is not intended to diagnose MRSA infection nor to guide or monitor treatment for MRSA infections. Performed at [facility], [address], [place], [place] [address]   Culture, respiratory (non-expectorated)     Status: None   Collection Time: 07/23/18 11:42 AM  Result Value Ref Range Status   Specimen Description TRACHEAL ASPIRATE  Final   Special Requests NONE  Final   Gram Stain   Final    NO WBC SEEN FEW BUDDING YEAST SEEN FEW GRAM POSITIVE RODS FEW GRAM POSITIVE COCCI IN PAIRS    Culture   Final    MODERATE CANDIDA TROPICALIS MODERATE GROUP B STREP(S.AGALACTIAE)ISOLATED TESTING AGAINST S. AGALACTIAE NOT ROUTINELY PERFORMED DUE TO PREDICTABILITY OF AMP/PEN/VAN SUSCEPTIBILITY. Performed at [facility], [address], [place], [place] [address]    Report Status 07/25/2018 FINAL  Final  Radiology Studies: Ct Abdomen Wo Contrast  Result Date: 07/29/2018 CLINICAL DATA:  Abnormal swallowing study with severe oro pharyngeal  dysphagia. Assessment abdominal anatomy for possible gastrostomy tube placement. EXAM: CT ABDOMEN WITHOUT CONTRAST TECHNIQUE: Multidetector CT imaging of the abdomen was performed following the standard protocol without IV contrast. COMPARISON:  None. FINDINGS: Lower chest: Mild bibasilar atelectasis. No pleural effusions. No hiatal hernia identified. Hepatobiliary: No focal liver abnormality is seen. No gallstones, gallbladder wall thickening, or biliary dilatation. Pancreas: Unremarkable. No pancreatic ductal dilatation or surrounding inflammatory changes. Spleen: Normal in size without focal abnormality. Adrenals/Urinary Tract: Adrenal glands are unremarkable. Kidneys are normal, without renal calculi, focal lesion, or hydronephrosis. Stomach/Bowel: Indwelling feeding tube extends through the stomach and terminates in the region of the duodenal bulb. Gastric anatomy appears normal with the stomach lying superior to the transverse colon. No evidence of bowel obstruction or significant ileus. No free air identified. No incidental masses. Tiny benign-appearing mesenteric calcification in the anterior peritoneal cavity just below the umbilicus. Vascular/Lymphatic: Mild calcified plaque in a normal caliber abdominal aorta. No lymphadenopathy visualized. Other: No abdominal wall hernias, ascites or abnormal fluid collections identified. No evidence of anasarca or body wall edema. Musculoskeletal: No acute or significant osseous findings. IMPRESSION: Normal bowel anatomy with no contraindication to potential future percutaneous gastrostomy tube placement. No acute findings in the abdomen. The indwelling feeding tube extends to the region of the duodenal bulb. Electronically Signed   By: Irish Lack M.D.   On: 07/29/2018 07:58   Dg Swallowing Func-speech Pathology  Result Date: 07/28/2018 Objective Swallowing Evaluation: Type of Study: MBS-Modified Barium Swallow Study  Patient Details Name: Brandt Chaney MRN:  [id number] Date of Birth: 31-Jan-1971 Today's Date: 07/28/2018 Time: SLP Start Time (ACUTE ONLY): 1330 -SLP Stop Time (ACUTE ONLY): 1350 SLP Time Calculation (min) (ACUTE ONLY): 20 min Past Medical History: Past Medical History: Diagnosis Date . Anxiety  . Depression  . Diabetes mellitus without complication (HCC)   Type II . GERD (gastroesophageal reflux disease)  . H/O seasonal allergies  . Headache  . Hyperlipidemia  . Hypertension  . Hypothyroidism  . Neuropathy  . Numbness and tingling of both lower extremities  . PTSD (post-traumatic stress disorder)  . Sleep apnea  . Thyroid disease  Past Surgical History: Past Surgical History: Procedure Laterality Date . ANTERIOR CERVICAL DECOMP/DISCECTOMY FUSION N/A 07/15/2018  Procedure: ANTERIOR CERVICAL DECOMPRESSION/DISCECTOMY FUSION TWO LEVELS WITH CERVICAL SIX CORPECTOMY;  Surgeon: Estill Bamberg, MD;  Location: [facility] OR;  Service: Orthopedics;  Laterality: N/A; . HERNIA REPAIR    child . POSTERIOR CERVICAL FUSION/FORAMINOTOMY  07/16/2018  Procedure: Posterior Cervical Fusion/Foraminotomy Level 2;  Surgeon: Estill Bamberg, MD;  Location: [facility] OR;  Service: Orthopedics;; HPI: Pt is a 48 yo male s/p anterior/posterior CDF 4/1. Presented from hotel room with AMS, hypoxia and right sided weakness. Intubated 4/8-4/11. MRI multifocal bilateral infarcts, right cerebellum, posterior cerebral artery, left basal gangles. Also fouind to have bacteremia, sepsis and DKA. Pt PMH: DM, anxiety, depression, HTN. CXR interval extubation and removal of enteric tube. Stable mild hazy and reticular opacities at the lung bases, left greater than right.  No data recorded Assessment / Plan / Recommendation CHL IP CLINICAL IMPRESSIONS 07/28/2018 Clinical Impression Pt demonstrated moderate-severe oropharyngeal dysphagia with penetration and brief aspiration with puree texture. Pt's anterior hyolaryngeal elevation was significantly decreased, no discernable epiglottic deflection or pharyngeal contraction  attributed to silent penetration and brief aspiration of puree texture, max vallecular residue. Pt produced a delayed cough after approximately 4  minutes. Oral phase marked by lingual pumping, prolonged transit and lingual residue. Pt requires extended amount of time to produce dry swallow and he cannot produce effective cough to clear penetrates. Due to limitations in pt's cognition and neck ROM, strategies were not attempted. He is not safe for po's and should continue non oral means of nutrition. Prognosis is good given age but may be extended given effects of A/P CDF and stroke on swallow function. Will continue intervention.     SLP Visit Diagnosis Dysphagia, oropharyngeal phase (R13.12) Attention and concentration deficit following -- Frontal lobe and executive function deficit following -- Impact on safety and function Moderate aspiration risk   CHL IP TREATMENT RECOMMENDATION 07/28/2018 Treatment Recommendations Therapy as outlined in treatment plan below   Prognosis 07/28/2018 Prognosis for Safe Diet Advancement Good Barriers to Reach Goals Severity of deficits Barriers/Prognosis Comment -- CHL IP DIET RECOMMENDATION 07/28/2018 SLP Diet Recommendations NPO Liquid Administration via -- Medication Administration Via alternative means Compensations -- Postural Changes --   CHL IP OTHER RECOMMENDATIONS 07/28/2018 Recommended Consults -- Oral Care Recommendations Oral care QID Other Recommendations --   CHL IP FOLLOW UP RECOMMENDATIONS 07/28/2018 Follow up Recommendations Skilled Nursing facility   [facility] IP FREQUENCY AND DURATION 07/28/2018 Speech Therapy Frequency (ACUTE ONLY) min 2x/week Treatment Duration 2 weeks      CHL IP ORAL PHASE 07/28/2018 Oral Phase Impaired Oral - Pudding Teaspoon -- Oral - Pudding Cup -- Oral - Honey Teaspoon -- Oral - Honey Cup -- Oral - Nectar Teaspoon -- Oral - Nectar Cup -- Oral - Nectar Straw -- Oral - Thin Teaspoon -- Oral - Thin Cup -- Oral - Thin Straw -- Oral - Puree Delayed oral  transit;Lingual pumping;Decreased bolus cohesion;Lingual/palatal residue;Reduced posterior propulsion Oral - Mech Soft -- Oral - Regular -- Oral - Multi-Consistency -- Oral - Pill -- Oral Phase - Comment --  CHL IP PHARYNGEAL PHASE 07/28/2018 Pharyngeal Phase Impaired Pharyngeal- Pudding Teaspoon -- Pharyngeal -- Pharyngeal- Pudding Cup -- Pharyngeal -- Pharyngeal- Honey Teaspoon -- Pharyngeal -- Pharyngeal- Honey Cup -- Pharyngeal -- Pharyngeal- Nectar Teaspoon -- Pharyngeal -- Pharyngeal- Nectar Cup -- Pharyngeal -- Pharyngeal- Nectar Straw -- Pharyngeal -- Pharyngeal- Thin Teaspoon -- Pharyngeal -- Pharyngeal- Thin Cup -- Pharyngeal -- Pharyngeal- Thin Straw -- Pharyngeal -- Pharyngeal- Puree Penetration/Aspiration during swallow;Reduced airway/laryngeal closure;Reduced laryngeal elevation;Reduced epiglottic inversion;Reduced anterior laryngeal mobility Pharyngeal Material enters airway, passes BELOW cords then ejected out Pharyngeal- Mechanical Soft -- Pharyngeal -- Pharyngeal- Regular -- Pharyngeal -- Pharyngeal- Multi-consistency -- Pharyngeal -- Pharyngeal- Pill -- Pharyngeal -- Pharyngeal Comment --  CHL IP CERVICAL ESOPHAGEAL PHASE 07/28/2018 Cervical Esophageal Phase WFL Pudding Teaspoon -- Pudding Cup -- Honey Teaspoon -- Honey Cup -- Nectar Teaspoon -- Nectar Cup -- Nectar Straw -- Thin Teaspoon -- Thin Cup -- Thin Straw -- Puree -- Mechanical Soft -- Regular -- Multi-consistency -- Pill -- Cervical Esophageal Comment -- [name] 07/28/2018, 4:24 PM [name] [name] administrator Pager [phone] Office [phone]               Scheduled Meds: . amLODipine  10 mg Oral Daily  . aspirin EC  325 mg Oral Daily   Or  . aspirin  300 mg Rectal Daily  . atorvastatin  80 mg Oral Daily  . [START ON 07/31/2018] enoxaparin (LOVENOX) injection  40 mg Subcutaneous Q24H  . feeding supplement (GLUCERNA 1.2 CAL)  480 mL Per Tube TID  . feeding supplement (PRO-STAT SUGAR FREE  64)  30  mL Per Tube BID  . insulin aspart  0-20 Units Subcutaneous Q4H  . insulin aspart  3 Units Subcutaneous TID WC  . insulin detemir  15 Units Subcutaneous Q12H  . ipratropium-albuterol  3 mL Nebulization BID  . levothyroxine  175 mcg Per Tube QAC breakfast  . lisinopril  20 mg Oral Daily  . mouth rinse  15 mL Mouth Rinse BID   Continuous Infusions: . sodium chloride 10 mL/hr at 07/23/18 0700  . sodium chloride 100 mL/hr at 07/29/18 0403     LOS: 7 days   Time spent: 25 minutes.  Tyrone Nine, MD [facility] [email or web address] Password [facility] 07/29/2018, 3:42 PM

## 2018-07-29 NOTE — Progress Notes (Signed)
Inpatient Diabetes Program Recommendations  AACE/ADA: New Consensus Statement on Inpatient Glycemic Control (2015)  Target Ranges:  Prepandial:   less than 140 mg/dL      Peak postprandial:   less than 180 mg/dL (1-2 hours)      Critically ill patients:  140 - 180 mg/dL   Lab Results  Component Value Date   GLUCAP 99 07/29/2018   HGBA1C 10.9 (H) 07/13/2018    Review of Glycemic Control  Diabetes history: DM 2 Outpatient Diabetes medications: NPH 47 units qam, 36 units qpm, Regular insulin 27 units in am, 17 units qpm, Metformin 1000 mg BID Current orders for Inpatient glycemic control: Levemir 18 units Q12 hours, Novolog 3-9 units Q4 hours  Inpatient Diabetes Program Recommendations:    Hypoglycemia of 52 mg/dl at 3570 3 days ago. Hypoglycemia again yesterday am in the 30's.    -  Consider d/cing ICU order set (currently on ICU Resistant scale) for glycemic control and order Novolog Sensitive Correction scale 0-9 unit Q4.    -  Patient receiving bolus feeds during the day Consider Bolus Feed Coverage Novolog 3 units tid with feeds.   -  May also consider decreasing Levemir to 16 units BID.  Thanks,  Christena Deem RN, MSN, BC-ADM Inpatient Diabetes Coordinator Team Pager [phone] (8a-5p)

## 2018-07-29 NOTE — Progress Notes (Signed)
IR consulted for percutaneous gastrostomy tube placement.  Patient failed MBS and remains at high risk for aspiration.  Spoke with Frederik Schmidt who provides consent.  Hold TF p MN.  Hold lovenox.   Loyce Dys, MS RD PA-C 12:59 PM

## 2018-07-30 ENCOUNTER — Inpatient Hospital Stay (HOSPITAL_COMMUNITY): Payer: Medicaid Other

## 2018-07-30 ENCOUNTER — Encounter (HOSPITAL_COMMUNITY): Payer: Self-pay | Admitting: Diagnostic Radiology

## 2018-07-30 DIAGNOSIS — E0811 Diabetes mellitus due to underlying condition with ketoacidosis with coma: Secondary | ICD-10-CM | POA: Diagnosis not present

## 2018-07-30 HISTORY — PX: IR GASTROSTOMY TUBE MOD SED: IMG625

## 2018-07-30 LAB — CBC
HCT: 30.1 % — ABNORMAL LOW (ref 39.0–52.0)
Hemoglobin: 10.3 g/dL — ABNORMAL LOW (ref 13.0–17.0)
MCH: 31.2 pg (ref 26.0–34.0)
MCHC: 34.2 g/dL (ref 30.0–36.0)
MCV: 91.2 fL (ref 80.0–100.0)
Platelets: 747 10*3/uL — ABNORMAL HIGH (ref 150–400)
RBC: 3.3 MIL/uL — ABNORMAL LOW (ref 4.22–5.81)
RDW: 14 % (ref 11.5–15.5)
WBC: 9.9 10*3/uL (ref 4.0–10.5)
nRBC: 0 % (ref 0.0–0.2)

## 2018-07-30 LAB — GLUCOSE, CAPILLARY
Glucose-Capillary: 137 mg/dL — ABNORMAL HIGH (ref 70–99)
Glucose-Capillary: 149 mg/dL — ABNORMAL HIGH (ref 70–99)
Glucose-Capillary: 208 mg/dL — ABNORMAL HIGH (ref 70–99)
Glucose-Capillary: 225 mg/dL — ABNORMAL HIGH (ref 70–99)
Glucose-Capillary: 38 mg/dL — CL (ref 70–99)
Glucose-Capillary: 76 mg/dL (ref 70–99)
Glucose-Capillary: 93 mg/dL (ref 70–99)

## 2018-07-30 LAB — BASIC METABOLIC PANEL
Anion gap: 9 (ref 5–15)
BUN: 16 mg/dL (ref 6–20)
CO2: 29 mmol/L (ref 22–32)
Calcium: 9.1 mg/dL (ref 8.9–10.3)
Chloride: 96 mmol/L — ABNORMAL LOW (ref 98–111)
Creatinine, Ser: 0.75 mg/dL (ref 0.61–1.24)
GFR calc Af Amer: >60 mL/min (ref >60)
GFR calc non Af Amer: >60 mL/min (ref >60)
Glucose, Bld: 78 mg/dL (ref 70–99)
Potassium: 3.6 mmol/L (ref 3.5–5.1)
Sodium: 134 mmol/L — ABNORMAL LOW (ref 135–145)

## 2018-07-30 MED ORDER — CEFAZOLIN SODIUM-DEXTROSE 2-4 GM/100ML-% IV SOLN
2.0000 g | Freq: Once | INTRAVENOUS | Status: AC
  Administered 2018-07-30: 2 g via INTRAVENOUS
  Filled 2018-07-30: qty 100

## 2018-07-30 MED ORDER — FENTANYL CITRATE (PF) 100 MCG/2ML IJ SOLN
INTRAMUSCULAR | Status: AC | PRN
  Administered 2018-07-30: 25 ug via INTRAVENOUS

## 2018-07-30 MED ORDER — GLUCAGON HCL RDNA (DIAGNOSTIC) 1 MG IJ SOLR
INTRAMUSCULAR | Status: AC
  Administered 2018-07-30: 12:00:00
  Filled 2018-07-30: qty 1

## 2018-07-30 MED ORDER — MIDAZOLAM HCL 2 MG/2ML IJ SOLN
INTRAMUSCULAR | Status: AC | PRN
  Administered 2018-07-30: 0.5 mg via INTRAVENOUS

## 2018-07-30 MED ORDER — LIDOCAINE HCL 1 % IJ SOLN
INTRAMUSCULAR | Status: AC
  Administered 2018-07-30: 12:00:00
  Filled 2018-07-30: qty 20

## 2018-07-30 MED ORDER — FENTANYL CITRATE (PF) 100 MCG/2ML IJ SOLN
INTRAMUSCULAR | Status: AC
  Administered 2018-07-30: 12:00:00
  Filled 2018-07-30: qty 2

## 2018-07-30 MED ORDER — GLUCAGON HCL (RDNA) 1 MG IJ SOLR
INTRAMUSCULAR | Status: AC | PRN
  Administered 2018-07-30: 1 mg via INTRAVENOUS

## 2018-07-30 MED ORDER — IOHEXOL 300 MG/ML  SOLN
50.0000 mL | Freq: Once | INTRAMUSCULAR | Status: AC | PRN
  Administered 2018-07-30: 30 mL via INTRAVENOUS

## 2018-07-30 MED ORDER — CEFAZOLIN SODIUM-DEXTROSE 2-4 GM/100ML-% IV SOLN
INTRAVENOUS | Status: AC
  Administered 2018-07-30: 12:00:00
  Filled 2018-07-30: qty 100

## 2018-07-30 MED ORDER — OXYCODONE HCL 5 MG PO TABS
10.0000 mg | ORAL_TABLET | Freq: Once | ORAL | Status: AC
  Administered 2018-07-30: 10 mg via ORAL
  Filled 2018-07-30: qty 2

## 2018-07-30 MED ORDER — MIDAZOLAM HCL 2 MG/2ML IJ SOLN
INTRAMUSCULAR | Status: AC
  Administered 2018-07-30: 12:00:00
  Filled 2018-07-30: qty 2

## 2018-07-30 MED ORDER — LIDOCAINE HCL (PF) 1 % IJ SOLN
INTRAMUSCULAR | Status: AC | PRN
  Administered 2018-07-30: 10 mL

## 2018-07-30 NOTE — Progress Notes (Signed)
OT Cancellation Note  Patient Details Name: Corey Li MRN: [id number] DOB: 10-24-1970   Cancelled Treatment:     Reason for cancellation:  Pt off floor for placement of feeding tube.  Will continue to follow as appropriate.   Norton Pastel, OTR/L 07/30/2018, 10:52 AM

## 2018-07-30 NOTE — Discharge Summary (Addendum)
Patient ID: Corey Li MRN: [id number] DOB/AGE: Jul 03, 1970 48 y.o.  Admit date: 07/15/2018 Discharge date: 07/18/2018  Admission Diagnoses:  Active Problems:   Radiculopathy   Discharge Diagnoses:  Same  Past Medical History:  Diagnosis Date   Anxiety    Depression    Diabetes mellitus without complication (HCC)    Type II   GERD (gastroesophageal reflux disease)    H/O seasonal allergies    Headache    Hyperlipidemia    Hypertension    Hypothyroidism    Neuropathy    Numbness and tingling of both lower extremities    PTSD (post-traumatic stress disorder)    Sleep apnea    Thyroid disease     Surgeries: Procedure(s): Posterior Cervical Fusion/Foraminotomy Level 2 on 07/16/2018   Consultants:  None/Diabetic care mngmt  Discharged Condition: Improved  Hospital Course: Corey Li is an 48 y.o. male who was admitted 07/15/2018 for operative treatment of radiculopathy. Patient has severe unremitting pain that affects sleep, daily activities, and work/hobbies. After pre-op clearance the patient was taken to the operating room on 07/16/2018 and underwent  Procedure(s): Posterior Cervical Fusion/Foraminotomy Level 2.    Patient was given perioperative antibiotics:  Anti-infectives (From admission, onward)   Start     Dose/Rate Route Frequency Ordered Stop   07/15/18 2000  ceFAZolin (ANCEF) IVPB 2g/100 mL premix     2 g 200 mL/hr over 30 Minutes Intravenous Every 8 hours 07/15/18 1817 07/16/18 0606   07/15/18 0845  ceFAZolin (ANCEF) IVPB 2g/100 mL premix     2 g 200 mL/hr over 30 Minutes Intravenous On call to O.R. 07/15/18 4098 07/15/18 1234       Patient was given sequential compression devices, early ambulation to prevent DVT.  Patient benefited maximally from hospital stay and there were no complications.    Recent vital signs: BP 131/81 (BP Location: Left Arm)    Pulse 88    Temp 98.2 F (36.8 C) (Oral)    Resp 16    Ht 5\' 9"  (1.753 m)    Wt 85  kg    SpO2 95%    BMI 27.67 kg/m    Discharge Medications:   Allergies as of 07/18/2018   No Known Allergies     Medication List    TAKE these medications   amLODipine 5 MG tablet Commonly known as:  NORVASC Take 5 mg by mouth daily.   atorvastatin 80 MG tablet Commonly known as:  LIPITOR Take 80 mg by mouth daily.   diazepam 5 MG tablet Commonly known as:  VALIUM Take 1 tablet (5 mg total) by mouth every 6 (six) hours as needed for muscle spasms.   furosemide 40 MG tablet Commonly known as:  LASIX Take 40 mg by mouth 2 (two) times daily.   gabapentin 300 MG capsule Commonly known as:  NEURONTIN Take 300 mg by mouth daily as needed (pain).   insulin NPH Human 100 UNIT/ML injection Commonly known as:  HUMULIN N,NOVOLIN N Inject 36-47 Units into the skin See admin instructions. 47 units in the morning, and 36 units in the evening   insulin regular 100 units/mL injection Commonly known as:  NOVOLIN R,HUMULIN R Inject 17-27 Units into the skin See admin instructions. 27 units in the morning and 17 units in the evening   levothyroxine 175 MCG tablet Commonly known as:  SYNTHROID, LEVOTHROID Take 175 mcg by mouth daily before breakfast.   lisinopril 40 MG tablet Commonly known as:  PRINIVIL,ZESTRIL Take 40 mg by mouth daily.   metFORMIN 1000 MG tablet Commonly known as:  GLUCOPHAGE Take 1,000 mg by mouth 2 (two) times daily with a meal.   omeprazole 20 MG capsule Commonly known as:  PRILOSEC Take 20 mg by mouth daily.   oxyCODONE-acetaminophen 5-325 MG tablet Commonly known as:  PERCOCET/ROXICET Take 1-2 tablets by mouth every 4 (four) hours as needed for moderate pain or severe pain.   PROzac 10 MG capsule Generic drug:  FLUoxetine Take 10 mg by mouth daily.       Diagnostic Studies: Ct Abdomen Wo Contrast  Result Date: 07/29/2018 CLINICAL DATA:  Abnormal swallowing study with severe oro pharyngeal dysphagia. Assessment abdominal anatomy for possible  gastrostomy tube placement. EXAM: CT ABDOMEN WITHOUT CONTRAST TECHNIQUE: Multidetector CT imaging of the abdomen was performed following the standard protocol without IV contrast. COMPARISON:  None. FINDINGS: Lower chest: Mild bibasilar atelectasis. No pleural effusions. No hiatal hernia identified. Hepatobiliary: No focal liver abnormality is seen. No gallstones, gallbladder wall thickening, or biliary dilatation. Pancreas: Unremarkable. No pancreatic ductal dilatation or surrounding inflammatory changes. Spleen: Normal in size without focal abnormality. Adrenals/Urinary Tract: Adrenal glands are unremarkable. Kidneys are normal, without renal calculi, focal lesion, or hydronephrosis. Stomach/Bowel: Indwelling feeding tube extends through the stomach and terminates in the region of the duodenal bulb. Gastric anatomy appears normal with the stomach lying superior to the transverse colon. No evidence of bowel obstruction or significant ileus. No free air identified. No incidental masses. Tiny benign-appearing mesenteric calcification in the anterior peritoneal cavity just below the umbilicus. Vascular/Lymphatic: Mild calcified plaque in a normal caliber abdominal aorta. No lymphadenopathy visualized. Other: No abdominal wall hernias, ascites or abnormal fluid collections identified. No evidence of anasarca or body wall edema. Musculoskeletal: No acute or significant osseous findings. IMPRESSION: Normal bowel anatomy with no contraindication to potential future percutaneous gastrostomy tube placement. No acute findings in the abdomen. The indwelling feeding tube extends to the region of the duodenal bulb. Electronically Signed   By: Irish Lack M.D.   On: 07/29/2018 07:58   Ct Angio Head W Or Wo Contrast  Result Date: 07/22/2018 CLINICAL DATA:  Altered mental status. Right-sided weakness. Recent cervical spine surgery. EXAM: CT ANGIOGRAPHY HEAD AND NECK TECHNIQUE: Multidetector CT imaging of the head and neck  was performed using the standard protocol during bolus administration of intravenous contrast. Multiplanar CT image reconstructions and MIPs were obtained to evaluate the vascular anatomy. Carotid stenosis measurements (when applicable) are obtained utilizing NASCET criteria, using the distal internal carotid diameter as the denominator. CONTRAST:  OMNIPAQUE IOHEXOL 350 MG/ML SOLN COMPARISON:  Head and cervical spine CTs 07/22/2018 FINDINGS: CTA NECK FINDINGS Aortic arch: Normal variant aortic arch branching pattern with common origin of the brachiocephalic and left common carotid arteries. Mild arch atherosclerosis. Widely patent arch vessel origins. Right carotid system: Patent without evidence of stenosis or dissection. Left carotid system: Patent without evidence of stenosis or dissection. Vertebral arteries: Patent without evidence of stenosis or dissection. Minimal calcified plaque at the right vertebral artery origin. Minimally dominant left vertebral artery. Skeleton: Postoperative changes from recent C5-C7 anterior and posterior fusion with C6 corpectomy as described on earlier cervical spine CT. Scattered postoperative stranding and gas in the soft tissues of the neck including mild prevertebral edema and/or fluid as well as ventral epidural gas at C5-6 and gas in the posterior operative bed from C5-C7. Other neck: Endotracheal and enteric tubes present. Upper chest: Patchy peripheral ground-glass opacities in the  included portions of both upper lobes, some of which are rounded. Review of the MIP images confirms the above findings CTA HEAD FINDINGS Anterior circulation: The internal carotid arteries are widely patent from skull base to carotid termini. ACAs and MCAs are patent without evidence of proximal branch occlusion or significant A1 or M1 stenosis. There is diffuse branch vessel irregular narrowing and distal attenuation. No aneurysm is identified. Posterior circulation: The intracranial  vertebral arteries are widely patent to the basilar. The basilar artery is widely patent a left PICA, right AICA, and bilateral SCAs are visualized but not well evaluated due to their small size. Posterior communicating arteries are not identified and may be diminutive or absent. The PCAs are patent with diffuse irregularity including multiple apparent severe P2 stenoses bilaterally. No aneurysm is identified. Venous sinuses: Patent. Anatomic variants: None. Delayed phase: Not performed. Review of the MIP images confirms the above findings IMPRESSION: 1. No emergent large vessel occlusion. 2. Widely patent cervical carotid and vertebral arteries. 3. Diffuse irregular narrowing of the small to medium-sized intracranial arteries. While this could be partly artifactual, a diffuse non-atherosclerotic process such as vasculitis is a concern. 4. Patchy ground-glass opacities in both lung apices, nonspecific. Atypical infection (including viral pneumonia) and noninfectious etiologies are possible. 5. Postoperative changes from recent cervical spine fusion as previously described. Postoperative infection is not excluded. These results were called by telephone at the time of interpretation on 07/22/2018 at 4:59 pm to Dr. Craige Cotta, who verbally acknowledged these results. Electronically Signed   By: Sebastian Ache M.D.   On: 07/22/2018 17:01   Dg Cervical Spine 1 View  Result Date: 07/16/2018 CLINICAL DATA:  OR localization EXAM: DG CERVICAL SPINE - 1 VIEW COMPARISON:  07/16/2018 FINDINGS: Posterior surgical instrument projects over the C6 posterior elements. Prior anterior cervical fusion at C5-C7 with corpectomy at C6. IMPRESSION: Intraoperative localization as above. Electronically Signed   By: Charlett Nose M.D.   On: 07/16/2018 11:25   Dg Cervical Spine 1 View  Result Date: 07/16/2018 CLINICAL DATA:  Cervical fusion EXAM: DG CERVICAL SPINE - 1 VIEW COMPARISON:  Intraoperative radiographs 07/15/2018 FINDINGS: A single  lateral intraoperative radiograph demonstrates anterior cervical fusion from C5-C7. Curved tip probe posterior to the C6 vertebral body/corpectomy. IMPRESSION: Intraoperative view as above. Electronically Signed   By: Genevive Bi M.D.   On: 07/16/2018 09:23   Dg Cervical Spine 1 View  Result Date: 07/15/2018 CLINICAL DATA:  Preop cervical fusion.  Left neck pain. EXAM: DG CERVICAL SPINE - 1 VIEW COMPARISON:  Cervical spine CT 05/07/2018 FINDINGS: Only a single AP view is submitted at the request of the ordering physician. Cannot assess for alignment or prevertebral soft tissue swelling. No visible acute bony abnormality. IMPRESSION: Limited study.  No visible acute bony abnormality. Electronically Signed   By: Charlett Nose M.D.   On: 07/15/2018 09:58   Dg Cervical Spine 2-3 Views  Result Date: 07/16/2018 CLINICAL DATA:  Cervical fusion. EXAM: CERVICAL SPINE - 2-3 VIEW; DG C-ARM 61-120 MIN COMPARISON:  CT cervical spine 05/07/2018. SOS MRI March 2020 FINDINGS: The patient has undergone C5-C7 fusion with C6 corpectomy, With lateral mass screws C5 and C7 connected by rods. IMPRESSION: C-arm spot films document   C5-C7 anterior and posterior fusion Electronically Signed   By: Elsie Stain M.D.   On: 07/16/2018 15:04   Dg Cervical Spine 2-3 Views  Result Date: 07/15/2018 CLINICAL DATA:  Cervical fusion EXAM: CERVICAL SPINE - 2-3 VIEW; DG C-ARM 61-120 MIN COMPARISON:  05/07/2018 FLUOROSCOPY TIME:  Fluoroscopy Time:  30 seconds Radiation Exposure Index (if provided by the fluoroscopic device): Not available Number of Acquired Spot Images: 3 FINDINGS: There are changes of corpectomy identified at C6 with interbody fusion at C5-6 and C6-7. Anterior fusion is noted. IMPRESSION: Changes of cervical fusion. Electronically Signed   By: Alcide Clever M.D.   On: 07/15/2018 16:24   Dg Abd 1 View  Result Date: 07/24/2018 CLINICAL DATA:  OG tube placement. EXAM: ABDOMEN - 1 VIEW COMPARISON:  Radiograph yesterday.  FINDINGS: Tip and side port of the enteric tube below the diaphragm in the stomach. Nonobstructive bowel gas pattern in the upper abdomen. IMPRESSION: Tip and side port of the enteric tube below the diaphragm in the stomach. Electronically Signed   By: Narda Rutherford M.D.   On: 07/24/2018 02:32   Dg Abd 1 View  Result Date: 07/23/2018 CLINICAL DATA:  NG tube placement. EXAM: ABDOMEN - 1 VIEW COMPARISON:  Radiograph yesterday at 1329 hour FINDINGS: The tip of the enteric tube is now below the diaphragm in the stomach, the side port remains in the distal esophagus. Recommend advancement of 5-10 cm for optimal placement. Nonobstructed bowel-gas pattern in the included upper abdomen. IMPRESSION: Tip of the enteric tube below the diaphragm in the stomach, side port remains in the distal esophagus. Recommend advancement of 5-10 cm for optimal placement. Electronically Signed   By: Narda Rutherford M.D.   On: 07/23/2018 02:36   Ct Angio Neck W Or Wo Contrast  Result Date: 07/22/2018 CLINICAL DATA:  Altered mental status. Right-sided weakness. Recent cervical spine surgery. EXAM: CT ANGIOGRAPHY HEAD AND NECK TECHNIQUE: Multidetector CT imaging of the head and neck was performed using the standard protocol during bolus administration of intravenous contrast. Multiplanar CT image reconstructions and MIPs were obtained to evaluate the vascular anatomy. Carotid stenosis measurements (when applicable) are obtained utilizing NASCET criteria, using the distal internal carotid diameter as the denominator. CONTRAST:  OMNIPAQUE IOHEXOL 350 MG/ML SOLN COMPARISON:  Head and cervical spine CTs 07/22/2018 FINDINGS: CTA NECK FINDINGS Aortic arch: Normal variant aortic arch branching pattern with common origin of the brachiocephalic and left common carotid arteries. Mild arch atherosclerosis. Widely patent arch vessel origins. Right carotid system: Patent without evidence of stenosis or dissection. Left carotid system: Patent  without evidence of stenosis or dissection. Vertebral arteries: Patent without evidence of stenosis or dissection. Minimal calcified plaque at the right vertebral artery origin. Minimally dominant left vertebral artery. Skeleton: Postoperative changes from recent C5-C7 anterior and posterior fusion with C6 corpectomy as described on earlier cervical spine CT. Scattered postoperative stranding and gas in the soft tissues of the neck including mild prevertebral edema and/or fluid as well as ventral epidural gas at C5-6 and gas in the posterior operative bed from C5-C7. Other neck: Endotracheal and enteric tubes present. Upper chest: Patchy peripheral ground-glass opacities in the included portions of both upper lobes, some of which are rounded. Review of the MIP images confirms the above findings CTA HEAD FINDINGS Anterior circulation: The internal carotid arteries are widely patent from skull base to carotid termini. ACAs and MCAs are patent without evidence of proximal branch occlusion or significant A1 or M1 stenosis. There is diffuse branch vessel irregular narrowing and distal attenuation. No aneurysm is identified. Posterior circulation: The intracranial vertebral arteries are widely patent to the basilar. The basilar artery is widely patent a left PICA, right AICA, and bilateral SCAs are visualized but not well evaluated due to their  small size. Posterior communicating arteries are not identified and may be diminutive or absent. The PCAs are patent with diffuse irregularity including multiple apparent severe P2 stenoses bilaterally. No aneurysm is identified. Venous sinuses: Patent. Anatomic variants: None. Delayed phase: Not performed. Review of the MIP images confirms the above findings IMPRESSION: 1. No emergent large vessel occlusion. 2. Widely patent cervical carotid and vertebral arteries. 3. Diffuse irregular narrowing of the small to medium-sized intracranial arteries. While this could be partly  artifactual, a diffuse non-atherosclerotic process such as vasculitis is a concern. 4. Patchy ground-glass opacities in both lung apices, nonspecific. Atypical infection (including viral pneumonia) and noninfectious etiologies are possible. 5. Postoperative changes from recent cervical spine fusion as previously described. Postoperative infection is not excluded. These results were called by telephone at the time of interpretation on 07/22/2018 at 4:59 pm to Dr. Craige Cotta, who verbally acknowledged these results. Electronically Signed   By: Sebastian Ache M.D.   On: 07/22/2018 17:01   Ct Cervical Spine Wo Contrast  Result Date: 07/22/2018 CLINICAL DATA:  Recent neck surgery 07/16/2018. Neck pain with radiculopathy. EXAM: CT CERVICAL SPINE WITHOUT CONTRAST TECHNIQUE: Multidetector CT imaging of the cervical spine was performed without intravenous contrast. Multiplanar CT image reconstructions were also generated. COMPARISON:  CT cervical spine 07/17/2018, MRI 06/29/2018 FINDINGS: Alignment: Normal Image quality degraded by moderate motion. Skull base and vertebrae: Negative for fracture Soft tissues and spinal canal: Prevertebral soft tissue swelling is unchanged most prominent at C4 through C6 and measuring approximately 12 mm in thickness. This is likely edema and fluid related to recent anterior cervical fusion. Improvement in soft tissue gas in the left neck and prevertebral soft tissues related to recent anterior fusion. Spinal canal not well evaluated due to streak artifact and patient motion Disc levels: C6 corpectomy with strut graft. Anterior plate fusion extending from C5 through C7. Posterior screw and rod fusion C5 through C7 bilaterally. Hardware in satisfactory position and unchanged from the prior CT. Upper chest: Lung apices clear bilaterally. Other: None IMPRESSION: Image quality degraded by moderate motion Corpectomy at C6. Anterior and posterior fusion from C5 through C7. Hardware in good position and  unchanged from recent study. Postop prevertebral soft tissue swelling related to recent surgery and unchanged from the prior study. Electronically Signed   By: Marlan Palau M.D.   On: 07/22/2018 13:21   Ct Cervical Spine Wo Contrast  Result Date: 07/17/2018 CLINICAL DATA:  New left shoulder and arm pain after cervical decompression and fusion yesterday. EXAM: CT CERVICAL SPINE WITHOUT CONTRAST TECHNIQUE: Multidetector CT imaging of the cervical spine was performed without intravenous contrast. Multiplanar CT image reconstructions were also generated. COMPARISON:  Intraoperative cervical spine images from yesterday and 07/15/2018. MRI cervical spine dated 06/29/2018. FINDINGS: Alignment: Normal. Skull base and vertebrae: Postsurgical changes related to interval C6 corpectomy and C5-C7 anterior and posterior fusion. No evidence of hardware failure or loosening. The left C7 posterior screw traverses the upper margin of the pedicle but does not extend into the neural foramen. Bone graft material overlies the C5-C6 and C6-C7 facet joints. No acute fracture. No primary bone lesion or focal pathologic process. Soft tissues and spinal canal: Expected postsurgical prevertebral soft tissue swelling and postsurgical changes in the left anterior neck and posterior midline. No discrete fluid collection. No definite canal hematoma, although there is limited evaluation of the canal at C5-C6 and C6-C7 due to streak artifact. Disc levels: C2-C3:  Negative. C3-C4:  Negative. C4-C5: Unchanged small central disc protrusion and  mild left uncovertebral hypertrophy. Unchanged mild spinal canal stenosis. No neuroforaminal stenosis. C5-C6: Postsurgical changes. Limited evaluation of the spinal canal due to streak artifact. Minimal uncovertebral hypertrophy. No neuroforaminal stenosis. C6-C7: Postsurgical changes. Limited evaluation of the spinal canal due to streak artifact. No neuroforaminal stenosis. C7-T1:  Negative. Upper chest:  Negative. Other: None. IMPRESSION: 1. Postsurgical changes related to interval C6 corpectomy and C5-C7 anterior and posterior fusion without acute postoperative complication. Streak artifact limits evaluation of the spinal canal at the surgical levels. 2. Unchanged mild spondylosis at C4-C5 with mild spinal canal stenosis. No significant neuroforaminal stenosis at any level. Electronically Signed   By: Obie DredgeWilliam T Derry M.D.   On: 07/17/2018 09:19   Mr Maxine GlennMra Head Wo Contrast  Result Date: 07/23/2018 CLINICAL DATA:  Stroke follow-up.  Right-sided paralysis. EXAM: MRI HEAD WITHOUT AND WITH CONTRAST MRA HEAD WITHOUT CONTRAST TECHNIQUE: Multiplanar, multiecho pulse sequences of the brain and surrounding structures were obtained without and with intravenous contrast. Angiographic images of the head were obtained using MRA technique without contrast. CONTRAST:  8 mL Gadavist COMPARISON:  Brain MRI 06/29/2018 CTA head neck 07/22/2018 FINDINGS: MRI HEAD FINDINGS BRAIN: Multifocal bilateral acute ischemia scattered throughout both hemispheres and multiple vascular territories, including the right cerebellum. The largest areas of ischemia are in the posterior cerebral artery territories. 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 There is moderate edema within the affected areas. There is mild leptomeningeal contrast enhancement within both PCA territories and within the left basal ganglia. The midline structures are normal. The white matter signal is normal for the patient's age. The CSF spaces are normal for age, with no hydrocephalus. Susceptibility-sensitive sequences show no chronic microhemorrhage or superficial siderosis. SKULL AND UPPER CERVICAL SPINE: The visualized skull base, calvarium, upper cervical spine and extracranial soft tissues are normal. SINUSES/ORBITS: No fluid levels or advanced  mucosal thickening. No mastoid or middle ear effusion. The orbits are normal. MRA HEAD FINDINGS POSTERIOR CIRCULATION: --Basilar artery: Normal. --Posterior cerebral arteries: Patent proximally. Multifocal moderate-to-severe stenosis of the distal P2 segments. --Superior cerebellar arteries: Normal. --Inferior cerebellar arteries: Normal anterior and posterior inferior cerebellar arteries. ANTERIOR CIRCULATION: --Intracranial internal carotid arteries: Normal. --Anterior cerebral arteries: Normal. Both A1 segments are present. Patent anterior communicating artery. --Middle cerebral arteries: Normal. --Posterior communicating arteries: Absent or diminutive IMPRESSION: 1. Multifocal acute ischemia throughout multiple vascular territories in a pattern most consistent with emboli from a central cardiac or aortic source. The largest areas of ischemia are within the posterior cerebral artery territories. 2. No acute hemorrhage, herniation or hydrocephalus. 3. 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 4. No proximal intracranial arterial occlusion. Electronically Signed   By: Deatra RobinsonKevin  Herman M.D.   On: 07/23/2018 16:08   Mr Laqueta JeanBrain W WUWo Contrast  Result Date: 07/23/2018 CLINICAL DATA:  Stroke follow-up.  Right-sided paralysis. EXAM: MRI HEAD WITHOUT AND WITH CONTRAST MRA HEAD WITHOUT CONTRAST TECHNIQUE: Multiplanar, multiecho pulse sequences of the brain and surrounding structures were obtained without and with intravenous contrast. Angiographic images of the head were obtained using MRA technique without contrast. CONTRAST:  8 mL Gadavist COMPARISON:  Brain MRI 06/29/2018 CTA head neck 07/22/2018 FINDINGS: MRI HEAD FINDINGS BRAIN: Multifocal bilateral acute ischemia scattered throughout both hemispheres and multiple vascular territories, including the right cerebellum. The largest areas of ischemia are in the posterior cerebral artery territories. Infarct along the dorsal aspect of the left basal  ganglia, in close proximity to the posterior limb of the internal capsule could account for the right-sided weakness. There is moderate edema within the affected areas. There is mild leptomeningeal contrast enhancement within both PCA territories and within the left basal ganglia. The midline structures are normal. The white matter signal is normal for the patient's age. The CSF spaces are normal for age, with no hydrocephalus. Susceptibility-sensitive sequences show no chronic microhemorrhage or superficial siderosis. SKULL AND UPPER CERVICAL SPINE: The visualized skull base, calvarium, upper cervical spine and extracranial soft tissues are normal. SINUSES/ORBITS: No fluid levels or advanced mucosal thickening. No mastoid or middle ear effusion. The orbits are normal. MRA HEAD FINDINGS POSTERIOR CIRCULATION: --Basilar artery: Normal. --Posterior cerebral arteries: Patent proximally. Multifocal moderate-to-severe stenosis of the distal P2 segments. --Superior cerebellar arteries: Normal. --Inferior cerebellar arteries: Normal anterior and posterior inferior cerebellar arteries. ANTERIOR CIRCULATION: --Intracranial internal carotid arteries: Normal. --Anterior cerebral arteries: Normal. Both A1 segments are present. Patent anterior communicating artery. --Middle cerebral arteries: Normal. --Posterior communicating arteries: Absent or diminutive IMPRESSION: 1. Multifocal acute ischemia throughout multiple vascular territories in a pattern most consistent with emboli from a central cardiac or aortic source. The largest areas of ischemia are within the posterior cerebral artery territories. 2. No acute hemorrhage, herniation or hydrocephalus. 3. 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 4. No proximal intracranial arterial occlusion. Electronically Signed   By: Deatra Robinson M.D.   On: 07/23/2018 16:08   Dg Chest Port 1 View  Result Date: 07/26/2018 CLINICAL DATA:  Extubated EXAM: PORTABLE CHEST 1  VIEW COMPARISON:  Chest radiograph from one day prior. FINDINGS: Interval extubation and removal of enteric tube. Partially visualized surgical hardware overlying the lower cervical spine. Stable cardiomediastinal silhouette with normal heart size. No pneumothorax. No pleural effusion. No pulmonary edema. Mild hazy and reticular opacities at the lung bases, left greater than right, not appreciably changed. IMPRESSION: Interval extubation and removal of enteric tube. Stable mild hazy and reticular opacities at the lung bases, left greater than right. Electronically Signed   By: Delbert Phenix M.D.   On: 07/26/2018 08:34   Dg Chest Port 1 View  Result Date: 07/25/2018 CLINICAL DATA:  Ventilator dependent respiratory failure. Follow-up LEFT LOWER LOBE atelectasis and/or pneumonia. EXAM: PORTABLE CHEST 1 VIEW COMPARISON:  07/24/2018 and earlier. FINDINGS: Endotracheal tube tip in satisfactory position approximately 3 cm above carina. Gastric tube courses below the diaphragm into the stomach. Heart size normal for AP portable technique. Mild airspace consolidation in the LEFT LOWER LOBE, unchanged. New linear atelectasis in the LEFT UPPER LOBE and the BILATERAL lung bases. No confluent airspace consolidation elsewhere in either lung. IMPRESSION: 1. Support apparatus satisfactory. 2. Stable mild LEFT LOWER LOBE pneumonia. 3. New linear atelectasis in the LEFT UPPER LOBE and BILATERAL lung bases. Electronically Signed   By: Hulan Saas M.D.   On: 07/25/2018 07:54   Dg Chest Port 1 View  Result Date: 07/24/2018 CLINICAL DATA:  Intubation. EXAM: PORTABLE CHEST 1 VIEW COMPARISON:  Radiograph yesterday. FINDINGS: Endotracheal tube tip at the thoracic inlet 3.2 cm from the carina. Progressive volume loss in the left hemithorax with increasing basilar and mid lung patchy opacity. Progressive vague opacity in the left upper lung zone, suggesting partial lobar collapse. Right lung base atelectasis. Heart is normal in  size. IMPRESSION: 1. Endotracheal tube tip at the thoracic inlet 3.2 cm from the carina. 2. Progressive volume loss in the left hemithorax. New vague upper lung zone opacity suggesting partial lobar collapse.  Increasing patchy opacities throughout the mid and lower lung zone may be atelectasis, aspiration, or infection. Electronically Signed   By: Narda Rutherford M.D.   On: 07/24/2018 02:31   Dg Chest Port 1 View  Result Date: 07/23/2018 CLINICAL DATA:  Respiratory failure. EXAM: PORTABLE CHEST 1 VIEW COMPARISON:  Radiograph yesterday, lung apices from neck CTA yesterday. FINDINGS: Endotracheal tube tip remains just at the thoracic inlet. Enteric tube in place with tip below the diaphragm, side-port in the distal esophagus. Normal heart size and mediastinal contours. Mild bibasilar atelectasis. No pulmonary edema, pneumothorax, or pleural effusion. Surgical hardware in the cervical spine is partially included. IMPRESSION: 1. Endotracheal tube tip at the thoracic inlet. Enteric tube in place with tip below the diaphragm, side-port in the distal esophagus. Recommend advancement of cm for optimal placement. 2. Mild bibasilar atelectasis. Ground-glass opacities on neck CTA are not well demonstrated radiographically. Electronically Signed   By: Narda Rutherford M.D.   On: 07/23/2018 02:40   Dg Chest Port 1 View  Result Date: 07/22/2018 CLINICAL DATA:  Hypoxia and altered mental status EXAM: PORTABLE CHEST 1 VIEW COMPARISON:  None. FINDINGS: Endotracheal tube tip is 5.7 cm above the carina. Nasogastric tube tip is at the gastroesophageal junction. No pneumothorax. There is atelectatic change in the lung bases as well as in the right mid lung. There is no frank edema or consolidation. Heart size and pulmonary vascularity are normal. No adenopathy. No bone lesions. IMPRESSION: Tube positions as described without pneumothorax. Note that nasogastric tube tip is at the gastroesophageal junction. Advise advancing  nasogastric tube 8-10 cm. Areas of atelectatic change in the right mid lung and bilateral base regions. No consolidation. Heart size within normal limits. Electronically Signed   By: Bretta Bang III M.D.   On: 07/22/2018 13:59   Dg Abd Portable 1v  Result Date: 07/23/2018 CLINICAL DATA:  Gastric tube placement EXAM: PORTABLE ABDOMEN - 1 VIEW COMPARISON:  07/23/2018 FINDINGS: Gastric tube has been advanced and is now coiled within the body of the stomach. Normal bowel gas pattern. Moderate stool in the colon. No acute skeletal abnormality. IMPRESSION: NG tube has been advanced and is now coiled in the body the stomach. Nonobstructive bowel gas pattern. Electronically Signed   By: Marlan Palau M.D.   On: 07/23/2018 10:06   Dg Abd Portable 1v  Result Date: 07/22/2018 CLINICAL DATA:  Orogastric tube placement. Altered mental status and hypoxia EXAM: PORTABLE ABDOMEN - 1 VIEW COMPARISON:  None. FINDINGS: Orogastric tube tip is at the gastroesophageal junction with the side port in the distal esophagus. There is moderate stool in the colon. There is no bowel dilatation or air-fluid level to suggest bowel obstruction. No free air. There is bibasilar lung atelectatic change. IMPRESSION: Orogastric tube tip at the gastroesophageal junction. Advise advancing orogastric tube 8-10 cm. No bowel obstruction or free air evident. Bibasilar lung atelectatic change. Electronically Signed   By: Bretta Bang III M.D.   On: 07/22/2018 14:00   Dg Swallowing Func-speech Pathology  Result Date: 07/28/2018 Objective Swallowing Evaluation: Type of Study: MBS-Modified Barium Swallow Study  Patient Details Name: Corey Li MRN: [id number] Date of Birth: October 10, 1970 Today's Date: 07/28/2018 Time: SLP Start Time (ACUTE ONLY): 1330 -SLP Stop Time (ACUTE ONLY): 1350 SLP Time Calculation (min) (ACUTE ONLY): 20 min Past Medical History: Past Medical History: Diagnosis Date  Anxiety   Depression   Diabetes mellitus without  complication (HCC)   Type II  GERD (gastroesophageal reflux disease)   H/O  seasonal allergies   Headache   Hyperlipidemia   Hypertension   Hypothyroidism   Neuropathy   Numbness and tingling of both lower extremities   PTSD (post-traumatic stress disorder)   Sleep apnea   Thyroid disease  Past Surgical History: Past Surgical History: Procedure Laterality Date  ANTERIOR CERVICAL DECOMP/DISCECTOMY FUSION N/A 07/15/2018  Procedure: ANTERIOR CERVICAL DECOMPRESSION/DISCECTOMY FUSION TWO LEVELS WITH CERVICAL SIX CORPECTOMY;  Surgeon: Estill Bamberg, MD;  Location: [facility] OR;  Service: Orthopedics;  Laterality: N/A;  HERNIA REPAIR    child  POSTERIOR CERVICAL FUSION/FORAMINOTOMY  07/16/2018  Procedure: Posterior Cervical Fusion/Foraminotomy Level 2;  Surgeon: Estill Bamberg, MD;  Location: [facility] OR;  Service: Orthopedics;; HPI: Pt is a 48 yo male s/p anterior/posterior CDF 4/1. Presented from hotel room with AMS, hypoxia and right sided weakness. Intubated 4/8-4/11. MRI multifocal bilateral infarcts, right cerebellum, posterior cerebral artery, left basal gangles. Also fouind to have bacteremia, sepsis and DKA. Pt PMH: DM, anxiety, depression, HTN. CXR interval extubation and removal of enteric tube. Stable mild hazy and reticular opacities at the lung bases, left greater than right.  No data recorded Assessment / Plan / Recommendation CHL IP CLINICAL IMPRESSIONS 07/28/2018 Clinical Impression Pt demonstrated moderate-severe oropharyngeal dysphagia with penetration and brief aspiration with puree texture. Pt's anterior hyolaryngeal elevation was significantly decreased, no discernable epiglottic deflection or pharyngeal contraction attributed to silent penetration and brief aspiration of puree texture, max vallecular residue. Pt produced a delayed cough after approximately 4 minutes. Oral phase marked by lingual pumping, prolonged transit and lingual residue. Pt requires extended amount of time to produce dry swallow and  he cannot produce effective cough to clear penetrates. Due to limitations in pt's cognition and neck ROM, strategies were not attempted. He is not safe for po's and should continue non oral means of nutrition. Prognosis is good given age but may be extended given effects of A/P CDF and stroke on swallow function. Will continue intervention.     SLP Visit Diagnosis Dysphagia, oropharyngeal phase (R13.12) Attention and concentration deficit following -- Frontal lobe and executive function deficit following -- Impact on safety and function Moderate aspiration risk   CHL IP TREATMENT RECOMMENDATION 07/28/2018 Treatment Recommendations Therapy as outlined in treatment plan below   Prognosis 07/28/2018 Prognosis for Safe Diet Advancement Good Barriers to Reach Goals Severity of deficits Barriers/Prognosis Comment -- CHL IP DIET RECOMMENDATION 07/28/2018 SLP Diet Recommendations NPO Liquid Administration via -- Medication Administration Via alternative means Compensations -- Postural Changes --   CHL IP OTHER RECOMMENDATIONS 07/28/2018 Recommended Consults -- Oral Care Recommendations Oral care QID Other Recommendations --   CHL IP FOLLOW UP RECOMMENDATIONS 07/28/2018 Follow up Recommendations Skilled Nursing facility   [facility] IP FREQUENCY AND DURATION 07/28/2018 Speech Therapy Frequency (ACUTE ONLY) min 2x/week Treatment Duration 2 weeks      CHL IP ORAL PHASE 07/28/2018 Oral Phase Impaired Oral - Pudding Teaspoon -- Oral - Pudding Cup -- Oral - Honey Teaspoon -- Oral - Honey Cup -- Oral - Nectar Teaspoon -- Oral - Nectar Cup -- Oral - Nectar Straw -- Oral - Thin Teaspoon -- Oral - Thin Cup -- Oral - Thin Straw -- Oral - Puree Delayed oral transit;Lingual pumping;Decreased bolus cohesion;Lingual/palatal residue;Reduced posterior propulsion Oral - Mech Soft -- Oral - Regular -- Oral - Multi-Consistency -- Oral - Pill -- Oral Phase - Comment --  CHL IP PHARYNGEAL PHASE 07/28/2018 Pharyngeal Phase Impaired Pharyngeal- Pudding Teaspoon  -- Pharyngeal -- Pharyngeal- Pudding Cup -- Pharyngeal -- Pharyngeal- Honey Teaspoon --  Pharyngeal -- Pharyngeal- Honey Cup -- Pharyngeal -- Pharyngeal- Nectar Teaspoon -- Pharyngeal -- Pharyngeal- Nectar Cup -- Pharyngeal -- Pharyngeal- Nectar Straw -- Pharyngeal -- Pharyngeal- Thin Teaspoon -- Pharyngeal -- Pharyngeal- Thin Cup -- Pharyngeal -- Pharyngeal- Thin Straw -- Pharyngeal -- Pharyngeal- Puree Penetration/Aspiration during swallow;Reduced airway/laryngeal closure;Reduced laryngeal elevation;Reduced epiglottic inversion;Reduced anterior laryngeal mobility Pharyngeal Material enters airway, passes BELOW cords then ejected out Pharyngeal- Mechanical Soft -- Pharyngeal -- Pharyngeal- Regular -- Pharyngeal -- Pharyngeal- Multi-consistency -- Pharyngeal -- Pharyngeal- Pill -- Pharyngeal -- Pharyngeal Comment --  CHL IP CERVICAL ESOPHAGEAL PHASE 07/28/2018 Cervical Esophageal Phase WFL Pudding Teaspoon -- Pudding Cup -- Honey Teaspoon -- Honey Cup -- Nectar Teaspoon -- Nectar Cup -- Nectar Straw -- Thin Teaspoon -- Thin Cup -- Thin Straw -- Puree -- Mechanical Soft -- Regular -- Multi-consistency -- Pill -- Cervical Esophageal Comment -- [name] 07/28/2018, 4:24 PM [name] [name] administrator Pager [phone] Office [phone]              Dg C-arm 1-60 Min  Result Date: 07/16/2018 CLINICAL DATA:  Cervical fusion. EXAM: CERVICAL SPINE - 2-3 VIEW; DG C-ARM 61-120 MIN COMPARISON:  CT cervical spine 05/07/2018. SOS MRI March 2020 FINDINGS: The patient has undergone C5-C7 fusion with C6 corpectomy, With lateral mass screws C5 and C7 connected by rods. IMPRESSION: C-arm spot films document   C5-C7 anterior and posterior fusion Electronically Signed   By: Elsie Stain M.D.   On: 07/16/2018 15:04   Dg C-arm 1-60 Min  Result Date: 07/15/2018 CLINICAL DATA:  Cervical fusion EXAM: CERVICAL SPINE - 2-3 VIEW; DG C-ARM 61-120 MIN COMPARISON:  05/07/2018 FLUOROSCOPY TIME:   Fluoroscopy Time:  30 seconds Radiation Exposure Index (if provided by the fluoroscopic device): Not available Number of Acquired Spot Images: 3 FINDINGS: There are changes of corpectomy identified at C6 with interbody fusion at C5-6 and C6-7. Anterior fusion is noted. IMPRESSION: Changes of cervical fusion. Electronically Signed   By: Alcide Clever M.D.   On: 07/15/2018 16:24   Ct Head Code Stroke Wo Contrast`  Result Date: 07/22/2018 CLINICAL DATA:  Code stroke.  Altered level of consciousness. EXAM: CT HEAD WITHOUT CONTRAST TECHNIQUE: Contiguous axial images were obtained from the base of the skull through the vertex without intravenous contrast. COMPARISON:  MRI head 06/29/2018, CT head 05/07/2018 FINDINGS: Brain: Ventricle size normal. Negative for acute infarct, hemorrhage, mass. No edema or midline shift. Vascular: Negative for hyperdense vessel Skull: Negative Sinuses/Orbits: Negative Other: None ASPECTS ([place] Stroke Program Early CT Score) - Ganglionic level infarction (caudate, lentiform nuclei, internal capsule, insula, M1-M3 cortex): 7 - Supraganglionic infarction (M4-M6 cortex): 3 Total score (0-10 with 10 being normal): 10 IMPRESSION: 1. No acute abnormality 2. ASPECTS is 10 Electronically Signed   By: Marlan Palau M.D.   On: 07/22/2018 13:00   Vas [place] Lower Extremity Venous (dvt)  Result Date: 07/24/2018  Lower Venous Study Indications: Stroke.  Performing Technologist: Blanch Media RVS  Examination Guidelines: A complete evaluation includes B-mode imaging, spectral Doppler, color Doppler, and power Doppler as needed of all accessible portions of each vessel. Bilateral testing is considered an integral part of a complete examination. Limited examinations for reoccurring indications may be performed as noted.  Right Venous Findings: +---------+---------------+---------+-----------+----------+-------+            Compressibility Phasicity Spontaneity Properties Summary   +---------+---------------+---------+-----------+----------+-------+  CFV       Full  Yes       Yes                             +---------+---------------+---------+-----------+----------+-------+  SFJ       Full                                                      +---------+---------------+---------+-----------+----------+-------+  FV Prox   Full                                                      +---------+---------------+---------+-----------+----------+-------+  FV Mid    Full                                                      +---------+---------------+---------+-----------+----------+-------+  FV Distal Full                                                      +---------+---------------+---------+-----------+----------+-------+  PFV       Full                                                      +---------+---------------+---------+-----------+----------+-------+  POP       Full            Yes       Yes                             +---------+---------------+---------+-----------+----------+-------+  PTV       Full                                                      +---------+---------------+---------+-----------+----------+-------+  PERO      Full                                                      +---------+---------------+---------+-----------+----------+-------+  Left Venous Findings: +---------+---------------+---------+-----------+----------+-------+            Compressibility Phasicity Spontaneity Properties Summary  +---------+---------------+---------+-----------+----------+-------+  CFV       Full            Yes       Yes                             +---------+---------------+---------+-----------+----------+-------+  SFJ       Full                                                      +---------+---------------+---------+-----------+----------+-------+  FV Prox   Full                                                       +---------+---------------+---------+-----------+----------+-------+  FV Mid    Full                                                      +---------+---------------+---------+-----------+----------+-------+  FV Distal Full                                                      +---------+---------------+---------+-----------+----------+-------+  PFV       Full                                                      +---------+---------------+---------+-----------+----------+-------+  POP       Full            Yes       Yes                             +---------+---------------+---------+-----------+----------+-------+  PTV       Full                                                      +---------+---------------+---------+-----------+----------+-------+  PERO      Full                                                      +---------+---------------+---------+-----------+----------+-------+    Summary: Right: There is no evidence of deep vein thrombosis in the lower extremity. No cystic structure found in the popliteal fossa. Left: There is no evidence of deep vein thrombosis in the lower extremity. No cystic structure found in the popliteal fossa.  *See table(s) above for measurements and observations. Electronically signed by Fabienne Bruns MD on 07/24/2018 at 6:57:15 PM.    Final     Disposition:    Pt s/pA/P cervical decompression and fusion, doing well, with ill-defined left shoulder pain, likely due to positioning. The patient's CT is negative for nerve compression and his pain is nondermatomal. The discharge plan is as follows:  -  encourage ambulation - Percocet for pain, Valium for muscle spasms (both were sent to pharmacy) - d/c home today with f/u in 2 weeks - patient will need to discuss blood sugars with his PCP, as they are out of control. I encouraged patient to discuss this with PCP as soon as possible. He knows that his high blood sugars increase his risk for a nonunion and infection.  - will  keep an eye on his shoulder pain. This should improve in time. If is does not, a shoulder workup may be indicated. -D/C instructions sheet printed and in chart -D/C today  -F/U in office 2 weeks   Signed: Georga Bora 07/30/2018, 9:53 AM

## 2018-07-30 NOTE — Progress Notes (Signed)
PROGRESS NOTE  Corey Li  TCY:[id number] DOB: [birth year] DOA: 07/22/2018 PCP: [name], NP   Brief Narrative: Corey Li is a 48 y.o. male with a history of T2DM, HTN who underwent recent A/P CDF (C5-7) on 4/2 who presented with altered mental status and hypoxia with some right-sided weakness to the ED code stroke was called. He appeared septic and had DKA, admitted to ICU, intubated and ultimately extubated on 07/25/2018 transferred to [facility] on 07/26/2018. Therapy evaluations have recommended SNF for placement at discharge, PEG placed 4/16.  Assessment & Plan: Active Problems:   DKA (diabetic ketoacidoses) (HCC)   Encounter for intubation   Acute respiratory failure with hypoxia (HCC)   Cerebral embolism with cerebral infarction  Bilateral ischemic infarcts: Largest bilateral PCA territory including the left ACA, bilateral punctuate MCA and bilateral ACA infarcts likely embolic from cardioembolic source versus hypercoagulable state with DKA: 2D echo showed an EF of 50% negative bubble study. Lower extremity Doppler was negative. - Continue statin, LDL 72 - DM management as below - Normotension is now BP goal.  - Continue ASA 325mg  daily - Neurology was consulted who recommended a cardiac event monitor versus loop recorder to rule out A. Fib as an outpatient. - Consulted Child psychotherapist for skilled nursing facility placement.  Dysphagia: Very high risk aspiration, complication of CVA:  - PEG placed 4/16. Tube management per IR, plan to start TF's 4/17 most likely.   Acute respiratory failure with hypoxia possibly due to aspiration pneumonia: Patient extubated on 07/25/2018 transferred to [facility] hospital[facility] on 07/26/2018. He is low probability for COVID-19. A CTA of the head and neck showed bilateral apices groundglass opacity, there was a concern of mucous plugging on 07/23/2018. Chest x-ray mild opacity at the bases. - Continue unasyn x5 days, WBC improving since anaerobic  coverage included, 4/13 - 4/17  History of anterior/posterior C5-C7 decompression/fusion:  - Orthopedics has followed in the hospital. Appreciate recommendations. Incisions look healthy, continue cervical collar.  DKA, T2DM:HbA1c 10.9%, weaned from insulin gtt.  - Continue levemir and SSI. Some hypoglycemia in AM and over goal during day, will decrease levemir to 15u BID and change to mod SSI and 3u TIDW/bolus feeds. Has been at inpatient goal today.  Acute kidney injury: Likely prerenal azotemia resolved with IV fluid hydration creatinine now at baseline.  Acute metabolic encephalopathy/acute confusional state: - Continue prn haldol  CoNS bacteremia: In 1 out of 2 blood culture collections not consistent with true infection.   Essential hypertension: - Continue amlodipine  Hyperlipidemia: - Continue lipitor  Tobacco abuse: Current smoker. - Cessation counseling to be provided, though will not be able to smoke  Hypophosphatemia: - Replete foot phosphorus per tube, once the feeding tube is placed.  DVT prophylaxis: Lovenox Code Status: Full Family Communication: None at bedside Disposition Plan: SNF when tube feeds stable through PEG.Seems to be a difficult situation as patient does not have kids he is not married, his parents are both deceased, he has 2 siblings which he is estranged to, have spoken to a friend who is the emergency contact in the computer, she lives in [place]).  Consultants:   Neurology  Orthopedics  CCM  Procedures:   PEG placement 4/16.  Antimicrobials:  Ceftriaxone > unasyn 4/13  Subjective: PEG site is sore, but mild-moderately.   Objective: Vitals:   07/30/18 1025 07/30/18 1030 07/30/18 1035 07/30/18 1308  BP: (!) 151/92 (!) 176/97 (!) 181/94 136/76  Pulse: 82 83 81 78  Resp:  18 15 18 13   Temp:    97.6 F (36.4 C)  TempSrc:    Oral  SpO2: 96% 100% 98% 95%  Weight:      Height:        Intake/Output Summary (Last 24  hours) at 07/30/2018 1412 Last data filed at 07/30/2018 1136 Gross per 24 hour  Intake 1895.88 ml  Output 200 ml  Net 1695.88 ml   Filed Weights   07/25/18 0500 07/26/18 0500 07/27/18 0500  Weight: 75.2 kg 71.7 kg 71.9 kg   Gen: 48 y.o. male in no distress Pulm: Nonlabored breathing room air. Clear. CV: Regular rate and rhythm. No murmur, rub, or gallop. No JVD, no dependent edema. GI: Abdomen soft, minimally tender, PEG site dressing c/d/i, no discharge. Nondistended, +BS.  Ext: Warm, no deformities Skin: No rashes, lesions or ulcers on visualized skin. Neuro: Drowsy but rousable, right hemiplegia. Psych: Judgement and insight appear impaired.  Data Reviewed: I have personally reviewed following labs and imaging studies  CBC: Recent Labs  Lab 07/26/18 0549 07/27/18 0511 07/28/18 0431 07/29/18 0342 07/30/18 0521  WBC 16.5* 14.4* 13.6* 11.6* 9.9  NEUTROABS 14.1* 11.9* 10.6* 7.6  --   HGB 10.0* 10.4* 10.0* 9.3* 10.3*  HCT 30.7* 31.2* 29.5* 27.2* 30.1*  MCV 91.6 90.4 89.7 90.7 91.2  PLT 329 405* 506* 590* 747*   Basic Metabolic Panel: Recent Labs  Lab 07/24/18 0014 07/25/18 0350 07/26/18 0549 07/27/18 0511 07/28/18 0431 07/29/18 0342 07/30/18 0521  NA 140 140 136  --  135 137 134*  K 3.6 3.6 3.7  --  3.7 3.7 3.6  CL 103 103 97*  --  102 101 96*  CO2 25 24 27   --  25 27 29   GLUCOSE 166* 211* 114*  --  150* 70 78  BUN 24* 21* 12  --  16 18 16   CREATININE 1.15 0.85 0.76  --  0.85 0.76 0.75  CALCIUM 8.4* 8.3* 9.4  --  8.6* 8.7* 9.1  MG 2.2  --  2.2 2.2 2.2 2.3  --   PHOS 3.1  3.2 1.6* 2.0* 2.2* 2.6 2.7  --    GFR: Estimated Creatinine Clearance: 111.7 mL/min (by C-G formula based on SCr of 0.75 mg/dL). Liver Function Tests: Recent Labs  Lab 07/24/18 0014 07/25/18 0350 07/26/18 0549  AST  --   --  44*  ALT  --   --  25  ALKPHOS  --   --  93  BILITOT  --   --  0.7  PROT  --   --  6.7  ALBUMIN 3.1* 2.7* 2.9*   No results for input(s): LIPASE, AMYLASE in  the last 168 hours. No results for input(s): AMMONIA in the last 168 hours. Coagulation Profile: Recent Labs  Lab 07/29/18 1316  INR 1.0   Cardiac Enzymes: No results for input(s): CKTOTAL, CKMB, CKMBINDEX, TROPONINI in the last 168 hours. BNP (last 3 results) No results for input(s): PROBNP in the last 8760 hours. HbA1C: No results for input(s): HGBA1C in the last 72 hours. CBG: Recent Labs  Lab 07/29/18 2008 07/30/18 0041 07/30/18 0426 07/30/18 0755 07/30/18 1205  GLUCAP 98 137* 93 76 149*   Lipid Profile: No results for input(s): CHOL, HDL, LDLCALC, TRIG, CHOLHDL, LDLDIRECT in the last 72 hours. Thyroid Function Tests: No results for input(s): TSH, T4TOTAL, FREET4, T3FREE, THYROIDAB in the last 72 hours. Anemia Panel: No results for input(s): VITAMINB12, FOLATE, FERRITIN, TIBC, IRON, RETICCTPCT in the last 72  hours. Urine analysis:    Component Value Date/Time   COLORURINE YELLOW 07/22/2018 1234   APPEARANCEUR HAZY (A) 07/22/2018 1234   LABSPEC 1.016 07/22/2018 1234   PHURINE 5.0 07/22/2018 1234   GLUCOSEU >=500 (A) 07/22/2018 1234   HGBUR MODERATE (A) 07/22/2018 1234   BILIRUBINUR NEGATIVE 07/22/2018 1234   KETONESUR 20 (A) 07/22/2018 1234   PROTEINUR 100 (A) 07/22/2018 1234   NITRITE NEGATIVE 07/22/2018 1234   LEUKOCYTESUR NEGATIVE 07/22/2018 1234   Recent Results (from the past 240 hour(s))  Urine culture     Status: None   Collection Time: 07/22/18  1:42 PM  Result Value Ref Range Status   Specimen Description URINE, RANDOM  Final   Special Requests NONE  Final   Culture   Final    NO GROWTH Performed at [facility], [address] [place], [place] [address]    Report Status 07/23/2018 FINAL  Final  Blood culture (routine x 2)     Status: Abnormal   Collection Time: 07/22/18  2:29 PM  Result Value Ref Range Status   Specimen Description BLOOD LEFT ANTECUBITAL  Final   Special Requests   Final    BOTTLES DRAWN AEROBIC AND ANAEROBIC Blood  Culture adequate volume   Culture  Setup Time   Final    GRAM POSITIVE COCCI IN BOTH AEROBIC AND ANAEROBIC BOTTLES Organism ID to follow CRITICAL RESULT CALLED TO, READ BACK BY AND VERIFIED WITH: Babs Sciara PharmD 11:50 07/23/18 ([email or web address])    Culture (A)  Final    STAPHYLOCOCCUS SPECIES (COAGULASE NEGATIVE) THE SIGNIFICANCE OF ISOLATING THIS ORGANISM FROM A SINGLE SET OF BLOOD CULTURES WHEN MULTIPLE SETS ARE DRAWN IS UNCERTAIN. PLEASE NOTIFY THE MICROBIOLOGY DEPARTMENT WITHIN ONE WEEK IF SPECIATION AND SENSITIVITIES ARE REQUIRED. Performed at [facility] Lab, [address] [place], [place] [address]    Report Status 07/25/2018 FINAL  Final  Blood Culture ID Panel (Reflexed)     Status: Abnormal   Collection Time: 07/22/18  2:29 PM  Result Value Ref Range Status   Enterococcus species NOT DETECTED NOT DETECTED Final   Listeria monocytogenes NOT DETECTED NOT DETECTED Final   Staphylococcus species DETECTED (A) NOT DETECTED Final    Comment: Methicillin (oxacillin) resistant coagulase negative staphylococcus. Possible blood culture contaminant (unless isolated from more than one blood culture draw or clinical case suggests pathogenicity). No antibiotic treatment is indicated for blood  culture contaminants. CRITICAL RESULT CALLED TO, READ BACK BY AND VERIFIED WITH: Babs Sciara PharmD 11:50 07/23/18 ([email or web address])    Staphylococcus aureus (BCID) NOT DETECTED NOT DETECTED Final   Methicillin resistance DETECTED (A) NOT DETECTED Final    Comment: CRITICAL RESULT CALLED TO, READ BACK BY AND VERIFIED WITH: Babs Sciara PharmD 11:50 07/23/18 ([email or web address])    Streptococcus species NOT DETECTED NOT DETECTED Final   Streptococcus agalactiae NOT DETECTED NOT DETECTED Final   Streptococcus pneumoniae NOT DETECTED NOT DETECTED Final   Streptococcus pyogenes NOT DETECTED NOT DETECTED Final   Acinetobacter baumannii NOT DETECTED NOT DETECTED Final   Enterobacteriaceae species NOT DETECTED NOT DETECTED Final    Enterobacter cloacae complex NOT DETECTED NOT DETECTED Final   Escherichia coli NOT DETECTED NOT DETECTED Final   Klebsiella oxytoca NOT DETECTED NOT DETECTED Final   Klebsiella pneumoniae NOT DETECTED NOT DETECTED Final   Proteus species NOT DETECTED NOT DETECTED Final   Serratia marcescens NOT DETECTED NOT DETECTED Final   Haemophilus influenzae NOT DETECTED NOT DETECTED Final   Neisseria meningitidis NOT DETECTED NOT  DETECTED Final   Pseudomonas aeruginosa NOT DETECTED NOT DETECTED Final   Candida albicans NOT DETECTED NOT DETECTED Final   Candida glabrata NOT DETECTED NOT DETECTED Final   Candida krusei NOT DETECTED NOT DETECTED Final   Candida parapsilosis NOT DETECTED NOT DETECTED Final   Candida tropicalis NOT DETECTED NOT DETECTED Final    Comment: Performed at [facility], [address], [place], [place] [address]  Blood culture (routine x 2)     Status: None   Collection Time: 07/22/18  4:26 PM  Result Value Ref Range Status   Specimen Description BLOOD RIGHT ANTECUBITAL  Final   Special Requests   Final    BOTTLES DRAWN AEROBIC ONLY Blood Culture adequate volume   Culture   Final    NO GROWTH 5 DAYS Performed at [facility], [address] [place], [place] [address]    Report Status 07/27/2018 FINAL  Final  Respiratory Panel by PCR     Status: None   Collection Time: 07/22/18  5:08 PM  Result Value Ref Range Status   Adenovirus NOT DETECTED NOT DETECTED Final   Coronavirus 229E NOT DETECTED NOT DETECTED Final    Comment: (NOTE) The Coronavirus on the Respiratory Panel, DOES NOT test for the novel  Coronavirus (2019 nCoV)    Coronavirus HKU1 NOT DETECTED NOT DETECTED Final   Coronavirus NL63 NOT DETECTED NOT DETECTED Final   Coronavirus OC43 NOT DETECTED NOT DETECTED Final   Metapneumovirus NOT DETECTED NOT DETECTED Final   Rhinovirus / Enterovirus NOT DETECTED NOT DETECTED Final   Influenza A NOT DETECTED NOT DETECTED Final   Influenza B NOT  DETECTED NOT DETECTED Final   Parainfluenza Virus 1 NOT DETECTED NOT DETECTED Final   Parainfluenza Virus 2 NOT DETECTED NOT DETECTED Final   Parainfluenza Virus 3 NOT DETECTED NOT DETECTED Final   Parainfluenza Virus 4 NOT DETECTED NOT DETECTED Final   Respiratory Syncytial Virus NOT DETECTED NOT DETECTED Final   Bordetella pertussis NOT DETECTED NOT DETECTED Final   Chlamydophila pneumoniae NOT DETECTED NOT DETECTED Final   Mycoplasma pneumoniae NOT DETECTED NOT DETECTED Final    Comment: Performed at [facility] Lab, [address] [place], [place] [address]  MRSA PCR Screening     Status: None   Collection Time: 07/23/18  9:31 AM  Result Value Ref Range Status   MRSA by PCR NEGATIVE NEGATIVE Final    Comment:        The GeneXpert MRSA Assay (FDA approved for NASAL specimens only), is one component of a comprehensive MRSA colonization surveillance program. It is not intended to diagnose MRSA infection nor to guide or monitor treatment for MRSA infections. Performed at [facility], [address], [place], [place] [address]   Culture, respiratory (non-expectorated)     Status: None   Collection Time: 07/23/18 11:42 AM  Result Value Ref Range Status   Specimen Description TRACHEAL ASPIRATE  Final   Special Requests NONE  Final   Gram Stain   Final    NO WBC SEEN FEW BUDDING YEAST SEEN FEW GRAM POSITIVE RODS FEW GRAM POSITIVE COCCI IN PAIRS    Culture   Final    MODERATE CANDIDA TROPICALIS MODERATE GROUP B STREP(S.AGALACTIAE)ISOLATED TESTING AGAINST S. AGALACTIAE NOT ROUTINELY PERFORMED DUE TO PREDICTABILITY OF AMP/PEN/VAN SUSCEPTIBILITY. Performed at [facility], [address], [place], [place] [address]    Report Status 07/25/2018 FINAL  Final      Radiology  Studies: Ct Abdomen Wo Contrast  Result Date: 07/29/2018 CLINICAL DATA:  Abnormal swallowing study with severe oro pharyngeal dysphagia. Assessment abdominal anatomy for possible gastrostomy  tube placement. EXAM: CT ABDOMEN WITHOUT CONTRAST TECHNIQUE: Multidetector CT imaging of the abdomen was performed following the standard protocol without IV contrast. COMPARISON:  None. FINDINGS: Lower chest: Mild bibasilar atelectasis. No pleural effusions. No hiatal hernia identified. Hepatobiliary: No focal liver abnormality is seen. No gallstones, gallbladder wall thickening, or biliary dilatation. Pancreas: Unremarkable. No pancreatic ductal dilatation or surrounding inflammatory changes. Spleen: Normal in size without focal abnormality. Adrenals/Urinary Tract: Adrenal glands are unremarkable. Kidneys are normal, without renal calculi, focal lesion, or hydronephrosis. Stomach/Bowel: Indwelling feeding tube extends through the stomach and terminates in the region of the duodenal bulb. Gastric anatomy appears normal with the stomach lying superior to the transverse colon. No evidence of bowel obstruction or significant ileus. No free air identified. No incidental masses. Tiny benign-appearing mesenteric calcification in the anterior peritoneal cavity just below the umbilicus. Vascular/Lymphatic: Mild calcified plaque in a normal caliber abdominal aorta. No lymphadenopathy visualized. Other: No abdominal wall hernias, ascites or abnormal fluid collections identified. No evidence of anasarca or body wall edema. Musculoskeletal: No acute or significant osseous findings. IMPRESSION: Normal bowel anatomy with no contraindication to potential future percutaneous gastrostomy tube placement. No acute findings in the abdomen. The indwelling feeding tube extends to the region of the duodenal bulb. Electronically Signed   By: Irish Lack M.D.   On: 07/29/2018 07:58   Ir Gastrostomy Tube Mod Sed  Result Date: 07/30/2018 INDICATION: 48 year old with CVA and dysphagia. Scheduled for percutaneous gastrostomy tube. EXAM: PERCUTANEOUS GASTROSTOMY TUBE WITH FLUOROSCOPIC GUIDANCE Physician: Rachelle Hora. Lowella Dandy, MD MEDICATIONS:  Ancef 2 g; Antibiotics were administered within 1 hour of the procedure. Glucagon 1 mg IV ANESTHESIA/SEDATION: Versed 0.5 mg IV; Fentanyl 25 mcg IV Moderate Sedation Time:  17 minutes The patient was continuously monitored during the procedure by the interventional radiology nurse under my direct supervision. FLUOROSCOPY TIME:  Fluoroscopy Time: 4 minutes 36 seconds (25 mGy). COMPLICATIONS: None immediate. PROCEDURE: Informed consent was obtained for a percutaneous gastrostomy tube. The patient was placed on the interventional table. An orogastric tube was placed with fluoroscopic guidance. The anterior abdomen was prepped and draped in sterile fashion. Maximal barrier sterile technique was utilized including caps, mask, sterile gowns, sterile gloves, sterile drape, hand hygiene and skin antiseptic. Stomach was inflated with air through the orogastric tube. The skin and subcutaneous tissues were anesthetized with 1% lidocaine. A 17 gauge needle was directed into the distended stomach with fluoroscopic guidance. A wire was advanced into the stomach and a T-tact was deployed. A 9-French vascular sheath was placed and the orogastric tube was snared using a [employer] snare device. The orogastric tube and snare were pulled out of the patient's mouth. The snare device was connected to a 20-French gastrostomy tube. The snare device and gastrostomy tube were pulled through the patient's mouth and out the anterior abdominal wall. The gastrostomy tube was cut to an appropriate length. Contrast injection through gastrostomy tube confirmed placement within the stomach. Fluoroscopic images were obtained for documentation. The gastrostomy tube was flushed with normal saline. IMPRESSION: Successful fluoroscopic guided percutaneous gastrostomy tube placement. Electronically Signed   By: Richarda Overlie M.D.   On: 07/30/2018 13:46    Scheduled Meds: . amLODipine  10 mg Oral Daily  . aspirin EC  325 mg Oral Daily   Or  . aspirin   300 mg Rectal Daily  .  atorvastatin  80 mg Oral Daily  . [START ON 07/31/2018] enoxaparin (LOVENOX) injection  40 mg Subcutaneous Q24H  . feeding supplement (GLUCERNA 1.2 CAL)  480 mL Per Tube TID  . feeding supplement (PRO-STAT SUGAR FREE 64)  30 mL Per Tube BID  . insulin aspart  0-20 Units Subcutaneous Q4H  . insulin aspart  3 Units Subcutaneous TID WC  . insulin detemir  15 Units Subcutaneous Q12H  . ipratropium-albuterol  3 mL Nebulization BID  . levothyroxine  175 mcg Per Tube QAC breakfast  . lisinopril  20 mg Oral Daily  . mouth rinse  15 mL Mouth Rinse BID   Continuous Infusions: . sodium chloride 10 mL/hr at 07/23/18 0700  . sodium chloride 100 mL/hr at 07/30/18 0600     LOS: 8 days   Time spent: 25 minutes.  Tyrone Nineyan B Satin Boal, MD [facility] [email or web address] Password [email or web address] 07/30/2018, 2:12 PM

## 2018-07-30 NOTE — Procedures (Signed)
Interventional Radiology Procedure:   Indications: CVA and dysphagia  Procedure: Gastrostomy tube placement  Findings: 20 Fr gastrostomy tube in stomach  Complications: None     EBL: Minimal, less than 10 ml  Plan: Keep NPO today and will assess tube tomorrow for feeds.     Hitesh Fouche R. Lowella Dandy, MD  Pager: [phone]

## 2018-07-30 NOTE — Progress Notes (Signed)
SLP Cancellation Note  Patient Details Name: Corey Li MRN: [id number] DOB: March 19, 1971   Cancelled treatment:         Pt received PEG earlier. Will attempt tomorrow for dysphagia treatment.    [name] 07/30/2018, 3:04 PM  [name] [name].Ed Nurse, children's [phone] Office [phone]

## 2018-07-30 NOTE — Progress Notes (Signed)
Assisted tele visit to patient with family member.  Corey Li R, RN  

## 2018-07-30 NOTE — Progress Notes (Signed)
Patient back from peg tube placement, site clean dry and intact

## 2018-07-30 NOTE — Consult Note (Signed)
Chief Complaint: Patient was seen in consultation today for percutaneous gastric tube placement Chief Complaint  Patient presents with   Altered Mental Status   Hyperglycemia   at the request of  Dr Aubery Lapping   Supervising Physician: Richarda Overlie  Patient Status: [facility] - In-pt  History of Present Illness: Corey Li is a 48 y.o. male   DM; HTN AMS; hypoxic and right side weakness CVA Dysphagia Aspiration risk; Speech recommending NPO Deconditioning For long term care To be transferred to SNF soon  Request for percutaneous gastric tube placement Imaging approved by Dr Lowella Dandy   Past Medical History:  Diagnosis Date   Anxiety    Depression    Diabetes mellitus without complication (HCC)    Type II   GERD (gastroesophageal reflux disease)    H/O seasonal allergies    Headache    Hyperlipidemia    Hypertension    Hypothyroidism    Neuropathy    Numbness and tingling of both lower extremities    PTSD (post-traumatic stress disorder)    Sleep apnea    Thyroid disease     Past Surgical History:  Procedure Laterality Date   ANTERIOR CERVICAL DECOMP/DISCECTOMY FUSION N/A 07/15/2018   Procedure: ANTERIOR CERVICAL DECOMPRESSION/DISCECTOMY FUSION TWO LEVELS WITH CERVICAL SIX CORPECTOMY;  Surgeon: Estill Bamberg, MD;  Location: [facility] OR;  Service: Orthopedics;  Laterality: N/A;   HERNIA REPAIR     child   POSTERIOR CERVICAL FUSION/FORAMINOTOMY  07/16/2018   Procedure: Posterior Cervical Fusion/Foraminotomy Level 2;  Surgeon: Estill Bamberg, MD;  Location: [facility] OR;  Service: Orthopedics;;    Allergies: Patient has no known allergies.  Medications: Prior to Admission medications   Medication Sig Start Date End Date Taking? Authorizing Provider  amLODipine (NORVASC) 5 MG tablet Take 5 mg by mouth daily.   Yes [provider]  atorvastatin (LIPITOR) 80 MG tablet Take 80 mg by mouth daily.    Yes [provider]  diazepam (VALIUM) 5 MG tablet  Take 1 tablet (5 mg total) by mouth every 6 (six) hours as needed for muscle spasms. 07/18/18  Yes Estill Bamberg, MD  ferrous sulfate 325 (65 FE) MG tablet Take 325 mg by mouth daily with breakfast.   Yes [provider]  FLUoxetine (PROZAC) 10 MG capsule Take 10 mg by mouth daily.    Yes [provider]  furosemide (LASIX) 40 MG tablet Take 40 mg by mouth 2 (two) times daily.   Yes [provider]  gabapentin (NEURONTIN) 300 MG capsule Take 300 mg by mouth daily as needed (pain).    Yes [provider]  insulin NPH Human (HUMULIN N,NOVOLIN N) 100 UNIT/ML injection Inject 36-47 Units into the skin See admin instructions. 47 units in the morning, and 36 units in the evening   Yes [provider]  insulin regular (NOVOLIN R,HUMULIN R) 100 units/mL injection Inject 17-27 Units into the skin See admin instructions. 27 units in the morning and 17 units in the evening   Yes [provider]  levothyroxine (SYNTHROID, LEVOTHROID) 175 MCG tablet Take 175 mcg by mouth daily before breakfast.   Yes [provider]  lisinopril (PRINIVIL,ZESTRIL) 40 MG tablet Take 40 mg by mouth daily.   Yes [provider]  metFORMIN (GLUCOPHAGE) 1000 MG tablet Take 1,000 mg by mouth 2 (two) times daily with a meal.   Yes [provider]  mupirocin ointment (BACTROBAN) 2 % Place 1 application into the nose 2 (two) times daily.  Yes [provider]  omeprazole (PRILOSEC) 20 MG capsule Take 20 mg by mouth daily.    Yes [provider]  oxyCODONE-acetaminophen (PERCOCET/ROXICET) 5-325 MG tablet Take 1-2 tablets by mouth every 4 (four) hours as needed for moderate pain or severe pain. 07/18/18  Yes Estill Bamberg, MD  polyethylene glycol (MIRALAX / GLYCOLAX) 17 g packet Take 17 g by mouth daily as needed for mild constipation.   Yes [provider]     No family history on file.  Social History   Socioeconomic History    Marital status: Single    Spouse name: Not on file   Number of children: Not on file   Years of education: Not on file   Highest education level: Not on file  Occupational History   Not on file  Social Needs   Financial resource strain: Not on file   Food insecurity:    Worry: Not on file    Inability: Not on file   Transportation needs:    Medical: Not on file    Non-medical: Not on file  Tobacco Use   Smoking status: Current Every Day Smoker    Packs/day: 0.40    Years: 25.00    Pack years: 10.00    Types: Cigarettes   Smokeless tobacco: Former Neurosurgeon   Tobacco comment: has chewed in past  Substance and Sexual Activity   Alcohol use: Not Currently   Drug use: No   Sexual activity: Yes  Lifestyle   Physical activity:    Days per week: Not on file    Minutes per session: Not on file   Stress: Not on file  Relationships   Social connections:    Talks on phone: Not on file    Gets together: Not on file    Attends religious service: Not on file    Active member of club or organization: Not on file    Attends meetings of clubs or organizations: Not on file    Relationship status: Not on file  Other Topics Concern   Not on file  Social History Narrative   Not on file    Review of Systems: A 12 point ROS discussed and pertinent positives are indicated in the HPI above.  All other systems are negative.    Vital Signs: BP 139/85 (BP Location: Left Arm)    Pulse 76    Temp 97.9 F (36.6 C) (Oral)    Resp 12    Ht 5\' 9"  (1.753 m)    Wt 158 lb 8.2 oz (71.9 kg)    SpO2 98%    BMI 23.41 kg/m   Physical Exam Vitals signs reviewed.  Constitutional:      Appearance: He is ill-appearing.  Cardiovascular:     Rate and Rhythm: Normal rate and regular rhythm.     Heart sounds: Normal heart sounds.  Pulmonary:     Breath sounds: Normal breath sounds.  Abdominal:     General: Bowel sounds are normal.  Musculoskeletal:     Comments: Does not follow  commands Is moving all 4s  Skin:    General: Skin is warm.  Neurological:     Mental Status: He is disoriented.  Psychiatric:     Comments: Consent was obtained via phone with only known family friend Frederik Schmidt     Imaging: Ct Abdomen Wo Contrast  Result Date: 07/29/2018 CLINICAL DATA:  Abnormal swallowing study with severe oro pharyngeal dysphagia. Assessment abdominal anatomy for possible gastrostomy tube  placement. EXAM: CT ABDOMEN WITHOUT CONTRAST TECHNIQUE: Multidetector CT imaging of the abdomen was performed following the standard protocol without IV contrast. COMPARISON:  None. FINDINGS: Lower chest: Mild bibasilar atelectasis. No pleural effusions. No hiatal hernia identified. Hepatobiliary: No focal liver abnormality is seen. No gallstones, gallbladder wall thickening, or biliary dilatation. Pancreas: Unremarkable. No pancreatic ductal dilatation or surrounding inflammatory changes. Spleen: Normal in size without focal abnormality. Adrenals/Urinary Tract: Adrenal glands are unremarkable. Kidneys are normal, without renal calculi, focal lesion, or hydronephrosis. Stomach/Bowel: Indwelling feeding tube extends through the stomach and terminates in the region of the duodenal bulb. Gastric anatomy appears normal with the stomach lying superior to the transverse colon. No evidence of bowel obstruction or significant ileus. No free air identified. No incidental masses. Tiny benign-appearing mesenteric calcification in the anterior peritoneal cavity just below the umbilicus. Vascular/Lymphatic: Mild calcified plaque in a normal caliber abdominal aorta. No lymphadenopathy visualized. Other: No abdominal wall hernias, ascites or abnormal fluid collections identified. No evidence of anasarca or body wall edema. Musculoskeletal: No acute or significant osseous findings. IMPRESSION: Normal bowel anatomy with no contraindication to potential future percutaneous gastrostomy tube placement. No acute  findings in the abdomen. The indwelling feeding tube extends to the region of the duodenal bulb. Electronically Signed   By: Irish Lack M.D.   On: 07/29/2018 07:58   Ct Angio Head W Or Wo Contrast  Result Date: 07/22/2018 CLINICAL DATA:  Altered mental status. Right-sided weakness. Recent cervical spine surgery. EXAM: CT ANGIOGRAPHY HEAD AND NECK TECHNIQUE: Multidetector CT imaging of the head and neck was performed using the standard protocol during bolus administration of intravenous contrast. Multiplanar CT image reconstructions and MIPs were obtained to evaluate the vascular anatomy. Carotid stenosis measurements (when applicable) are obtained utilizing NASCET criteria, using the distal internal carotid diameter as the denominator. CONTRAST:  OMNIPAQUE IOHEXOL 350 MG/ML SOLN COMPARISON:  Head and cervical spine CTs 07/22/2018 FINDINGS: CTA NECK FINDINGS Aortic arch: Normal variant aortic arch branching pattern with common origin of the brachiocephalic and left common carotid arteries. Mild arch atherosclerosis. Widely patent arch vessel origins. Right carotid system: Patent without evidence of stenosis or dissection. Left carotid system: Patent without evidence of stenosis or dissection. Vertebral arteries: Patent without evidence of stenosis or dissection. Minimal calcified plaque at the right vertebral artery origin. Minimally dominant left vertebral artery. Skeleton: Postoperative changes from recent C5-C7 anterior and posterior fusion with C6 corpectomy as described on earlier cervical spine CT. Scattered postoperative stranding and gas in the soft tissues of the neck including mild prevertebral edema and/or fluid as well as ventral epidural gas at C5-6 and gas in the posterior operative bed from C5-C7. Other neck: Endotracheal and enteric tubes present. Upper chest: Patchy peripheral ground-glass opacities in the included portions of both upper lobes, some of which are rounded. Review of the  MIP images confirms the above findings CTA HEAD FINDINGS Anterior circulation: The internal carotid arteries are widely patent from skull base to carotid termini. ACAs and MCAs are patent without evidence of proximal branch occlusion or significant A1 or M1 stenosis. There is diffuse branch vessel irregular narrowing and distal attenuation. No aneurysm is identified. Posterior circulation: The intracranial vertebral arteries are widely patent to the basilar. The basilar artery is widely patent a left PICA, right AICA, and bilateral SCAs are visualized but not well evaluated due to their small size. Posterior communicating arteries are not identified and may be diminutive or absent. The PCAs are patent with diffuse irregularity  including multiple apparent severe P2 stenoses bilaterally. No aneurysm is identified. Venous sinuses: Patent. Anatomic variants: None. Delayed phase: Not performed. Review of the MIP images confirms the above findings IMPRESSION: 1. No emergent large vessel occlusion. 2. Widely patent cervical carotid and vertebral arteries. 3. Diffuse irregular narrowing of the small to medium-sized intracranial arteries. While this could be partly artifactual, a diffuse non-atherosclerotic process such as vasculitis is a concern. 4. Patchy ground-glass opacities in both lung apices, nonspecific. Atypical infection (including viral pneumonia) and noninfectious etiologies are possible. 5. Postoperative changes from recent cervical spine fusion as previously described. Postoperative infection is not excluded. These results were called by telephone at the time of interpretation on 07/22/2018 at 4:59 pm to Dr. Craige Cotta, who verbally acknowledged these results. Electronically Signed   By: Sebastian Ache M.D.   On: 07/22/2018 17:01   Dg Cervical Spine 1 View  Result Date: 07/16/2018 CLINICAL DATA:  OR localization EXAM: DG CERVICAL SPINE - 1 VIEW COMPARISON:  07/16/2018 FINDINGS: Posterior surgical instrument projects  over the C6 posterior elements. Prior anterior cervical fusion at C5-C7 with corpectomy at C6. IMPRESSION: Intraoperative localization as above. Electronically Signed   By: Charlett Nose M.D.   On: 07/16/2018 11:25   Dg Cervical Spine 1 View  Result Date: 07/16/2018 CLINICAL DATA:  Cervical fusion EXAM: DG CERVICAL SPINE - 1 VIEW COMPARISON:  Intraoperative radiographs 07/15/2018 FINDINGS: A single lateral intraoperative radiograph demonstrates anterior cervical fusion from C5-C7. Curved tip probe posterior to the C6 vertebral body/corpectomy. IMPRESSION: Intraoperative view as above. Electronically Signed   By: Genevive Bi M.D.   On: 07/16/2018 09:23   Dg Cervical Spine 1 View  Result Date: 07/15/2018 CLINICAL DATA:  Preop cervical fusion.  Left neck pain. EXAM: DG CERVICAL SPINE - 1 VIEW COMPARISON:  Cervical spine CT 05/07/2018 FINDINGS: Only a single AP view is submitted at the request of the ordering physician. Cannot assess for alignment or prevertebral soft tissue swelling. No visible acute bony abnormality. IMPRESSION: Limited study.  No visible acute bony abnormality. Electronically Signed   By: Charlett Nose M.D.   On: 07/15/2018 09:58   Dg Cervical Spine 2-3 Views  Result Date: 07/16/2018 CLINICAL DATA:  Cervical fusion. EXAM: CERVICAL SPINE - 2-3 VIEW; DG C-ARM 61-120 MIN COMPARISON:  CT cervical spine 05/07/2018. SOS MRI March 2020 FINDINGS: The patient has undergone C5-C7 fusion with C6 corpectomy, With lateral mass screws C5 and C7 connected by rods. IMPRESSION: C-arm spot films document   C5-C7 anterior and posterior fusion Electronically Signed   By: Elsie Stain M.D.   On: 07/16/2018 15:04   Dg Cervical Spine 2-3 Views  Result Date: 07/15/2018 CLINICAL DATA:  Cervical fusion EXAM: CERVICAL SPINE - 2-3 VIEW; DG C-ARM 61-120 MIN COMPARISON:  05/07/2018 FLUOROSCOPY TIME:  Fluoroscopy Time:  30 seconds Radiation Exposure Index (if provided by the fluoroscopic device): Not available  Number of Acquired Spot Images: 3 FINDINGS: There are changes of corpectomy identified at C6 with interbody fusion at C5-6 and C6-7. Anterior fusion is noted. IMPRESSION: Changes of cervical fusion. Electronically Signed   By: Alcide Clever M.D.   On: 07/15/2018 16:24   Dg Abd 1 View  Result Date: 07/24/2018 CLINICAL DATA:  OG tube placement. EXAM: ABDOMEN - 1 VIEW COMPARISON:  Radiograph yesterday. FINDINGS: Tip and side port of the enteric tube below the diaphragm in the stomach. Nonobstructive bowel gas pattern in the upper abdomen. IMPRESSION: Tip and side port of the enteric tube below the  diaphragm in the stomach. Electronically Signed   By: Narda Rutherford M.D.   On: 07/24/2018 02:32   Dg Abd 1 View  Result Date: 07/23/2018 CLINICAL DATA:  NG tube placement. EXAM: ABDOMEN - 1 VIEW COMPARISON:  Radiograph yesterday at 1329 hour FINDINGS: The tip of the enteric tube is now below the diaphragm in the stomach, the side port remains in the distal esophagus. Recommend advancement of 5-10 cm for optimal placement. Nonobstructed bowel-gas pattern in the included upper abdomen. IMPRESSION: Tip of the enteric tube below the diaphragm in the stomach, side port remains in the distal esophagus. Recommend advancement of 5-10 cm for optimal placement. Electronically Signed   By: Narda Rutherford M.D.   On: 07/23/2018 02:36   Ct Angio Neck W Or Wo Contrast  Result Date: 07/22/2018 CLINICAL DATA:  Altered mental status. Right-sided weakness. Recent cervical spine surgery. EXAM: CT ANGIOGRAPHY HEAD AND NECK TECHNIQUE: Multidetector CT imaging of the head and neck was performed using the standard protocol during bolus administration of intravenous contrast. Multiplanar CT image reconstructions and MIPs were obtained to evaluate the vascular anatomy. Carotid stenosis measurements (when applicable) are obtained utilizing NASCET criteria, using the distal internal carotid diameter as the denominator. CONTRAST:   OMNIPAQUE IOHEXOL 350 MG/ML SOLN COMPARISON:  Head and cervical spine CTs 07/22/2018 FINDINGS: CTA NECK FINDINGS Aortic arch: Normal variant aortic arch branching pattern with common origin of the brachiocephalic and left common carotid arteries. Mild arch atherosclerosis. Widely patent arch vessel origins. Right carotid system: Patent without evidence of stenosis or dissection. Left carotid system: Patent without evidence of stenosis or dissection. Vertebral arteries: Patent without evidence of stenosis or dissection. Minimal calcified plaque at the right vertebral artery origin. Minimally dominant left vertebral artery. Skeleton: Postoperative changes from recent C5-C7 anterior and posterior fusion with C6 corpectomy as described on earlier cervical spine CT. Scattered postoperative stranding and gas in the soft tissues of the neck including mild prevertebral edema and/or fluid as well as ventral epidural gas at C5-6 and gas in the posterior operative bed from C5-C7. Other neck: Endotracheal and enteric tubes present. Upper chest: Patchy peripheral ground-glass opacities in the included portions of both upper lobes, some of which are rounded. Review of the MIP images confirms the above findings CTA HEAD FINDINGS Anterior circulation: The internal carotid arteries are widely patent from skull base to carotid termini. ACAs and MCAs are patent without evidence of proximal branch occlusion or significant A1 or M1 stenosis. There is diffuse branch vessel irregular narrowing and distal attenuation. No aneurysm is identified. Posterior circulation: The intracranial vertebral arteries are widely patent to the basilar. The basilar artery is widely patent a left PICA, right AICA, and bilateral SCAs are visualized but not well evaluated due to their small size. Posterior communicating arteries are not identified and may be diminutive or absent. The PCAs are patent with diffuse irregularity including multiple apparent severe  P2 stenoses bilaterally. No aneurysm is identified. Venous sinuses: Patent. Anatomic variants: None. Delayed phase: Not performed. Review of the MIP images confirms the above findings IMPRESSION: 1. No emergent large vessel occlusion. 2. Widely patent cervical carotid and vertebral arteries. 3. Diffuse irregular narrowing of the small to medium-sized intracranial arteries. While this could be partly artifactual, a diffuse non-atherosclerotic process such as vasculitis is a concern. 4. Patchy ground-glass opacities in both lung apices, nonspecific. Atypical infection (including viral pneumonia) and noninfectious etiologies are possible. 5. Postoperative changes from recent cervical spine fusion as previously described. Postoperative  infection is not excluded. These results were called by telephone at the time of interpretation on 07/22/2018 at 4:59 pm to Dr. Craige Cotta, who verbally acknowledged these results. Electronically Signed   By: Sebastian Ache M.D.   On: 07/22/2018 17:01   Ct Cervical Spine Wo Contrast  Result Date: 07/22/2018 CLINICAL DATA:  Recent neck surgery 07/16/2018. Neck pain with radiculopathy. EXAM: CT CERVICAL SPINE WITHOUT CONTRAST TECHNIQUE: Multidetector CT imaging of the cervical spine was performed without intravenous contrast. Multiplanar CT image reconstructions were also generated. COMPARISON:  CT cervical spine 07/17/2018, MRI 06/29/2018 FINDINGS: Alignment: Normal Image quality degraded by moderate motion. Skull base and vertebrae: Negative for fracture Soft tissues and spinal canal: Prevertebral soft tissue swelling is unchanged most prominent at C4 through C6 and measuring approximately 12 mm in thickness. This is likely edema and fluid related to recent anterior cervical fusion. Improvement in soft tissue gas in the left neck and prevertebral soft tissues related to recent anterior fusion. Spinal canal not well evaluated due to streak artifact and patient motion Disc levels: C6 corpectomy  with strut graft. Anterior plate fusion extending from C5 through C7. Posterior screw and rod fusion C5 through C7 bilaterally. Hardware in satisfactory position and unchanged from the prior CT. Upper chest: Lung apices clear bilaterally. Other: None IMPRESSION: Image quality degraded by moderate motion Corpectomy at C6. Anterior and posterior fusion from C5 through C7. Hardware in good position and unchanged from recent study. Postop prevertebral soft tissue swelling related to recent surgery and unchanged from the prior study. Electronically Signed   By: Marlan Palau M.D.   On: 07/22/2018 13:21   Ct Cervical Spine Wo Contrast  Result Date: 07/17/2018 CLINICAL DATA:  New left shoulder and arm pain after cervical decompression and fusion yesterday. EXAM: CT CERVICAL SPINE WITHOUT CONTRAST TECHNIQUE: Multidetector CT imaging of the cervical spine was performed without intravenous contrast. Multiplanar CT image reconstructions were also generated. COMPARISON:  Intraoperative cervical spine images from yesterday and 07/15/2018. MRI cervical spine dated 06/29/2018. FINDINGS: Alignment: Normal. Skull base and vertebrae: Postsurgical changes related to interval C6 corpectomy and C5-C7 anterior and posterior fusion. No evidence of hardware failure or loosening. The left C7 posterior screw traverses the upper margin of the pedicle but does not extend into the neural foramen. Bone graft material overlies the C5-C6 and C6-C7 facet joints. No acute fracture. No primary bone lesion or focal pathologic process. Soft tissues and spinal canal: Expected postsurgical prevertebral soft tissue swelling and postsurgical changes in the left anterior neck and posterior midline. No discrete fluid collection. No definite canal hematoma, although there is limited evaluation of the canal at C5-C6 and C6-C7 due to streak artifact. Disc levels: C2-C3:  Negative. C3-C4:  Negative. C4-C5: Unchanged small central disc protrusion and mild  left uncovertebral hypertrophy. Unchanged mild spinal canal stenosis. No neuroforaminal stenosis. C5-C6: Postsurgical changes. Limited evaluation of the spinal canal due to streak artifact. Minimal uncovertebral hypertrophy. No neuroforaminal stenosis. C6-C7: Postsurgical changes. Limited evaluation of the spinal canal due to streak artifact. No neuroforaminal stenosis. C7-T1:  Negative. Upper chest: Negative. Other: None. IMPRESSION: 1. Postsurgical changes related to interval C6 corpectomy and C5-C7 anterior and posterior fusion without acute postoperative complication. Streak artifact limits evaluation of the spinal canal at the surgical levels. 2. Unchanged mild spondylosis at C4-C5 with mild spinal canal stenosis. No significant neuroforaminal stenosis at any level. Electronically Signed   By: Obie Dredge M.D.   On: 07/17/2018 09:19   Mr Lucretia Kern  Contrast  Result Date: 07/23/2018 CLINICAL DATA:  Stroke follow-up.  Right-sided paralysis. EXAM: MRI HEAD WITHOUT AND WITH CONTRAST MRA HEAD WITHOUT CONTRAST TECHNIQUE: Multiplanar, multiecho pulse sequences of the brain and surrounding structures were obtained without and with intravenous contrast. Angiographic images of the head were obtained using MRA technique without contrast. CONTRAST:  8 mL Gadavist COMPARISON:  Brain MRI 06/29/2018 CTA head neck 07/22/2018 FINDINGS: MRI HEAD FINDINGS BRAIN: Multifocal bilateral acute ischemia scattered throughout both hemispheres and multiple vascular territories, including the right cerebellum. The largest areas of ischemia are in the posterior cerebral artery territories. 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 There is moderate edema within the affected areas. There is mild leptomeningeal contrast enhancement within both PCA territories and within the left basal ganglia. The midline  structures are normal. The white matter signal is normal for the patient's age. The CSF spaces are normal for age, with no hydrocephalus. Susceptibility-sensitive sequences show no chronic microhemorrhage or superficial siderosis. SKULL AND UPPER CERVICAL SPINE: The visualized skull base, calvarium, upper cervical spine and extracranial soft tissues are normal. SINUSES/ORBITS: No fluid levels or advanced mucosal thickening. No mastoid or middle ear effusion. The orbits are normal. MRA HEAD FINDINGS POSTERIOR CIRCULATION: --Basilar artery: Normal. --Posterior cerebral arteries: Patent proximally. Multifocal moderate-to-severe stenosis of the distal P2 segments. --Superior cerebellar arteries: Normal. --Inferior cerebellar arteries: Normal anterior and posterior inferior cerebellar arteries. ANTERIOR CIRCULATION: --Intracranial internal carotid arteries: Normal. --Anterior cerebral arteries: Normal. Both A1 segments are present. Patent anterior communicating artery. --Middle cerebral arteries: Normal. --Posterior communicating arteries: Absent or diminutive IMPRESSION: 1. Multifocal acute ischemia throughout multiple vascular territories in a pattern most consistent with emboli from a central cardiac or aortic source. The largest areas of ischemia are within the posterior cerebral artery territories. 2. No acute hemorrhage, herniation or hydrocephalus. 3. 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 4. No proximal intracranial arterial occlusion. Electronically Signed   By: Deatra Robinson M.D.   On: 07/23/2018 16:08   Mr Laqueta Jean ZO Contrast  Result Date: 07/23/2018 CLINICAL DATA:  Stroke follow-up.  Right-sided paralysis. EXAM: MRI HEAD WITHOUT AND WITH CONTRAST MRA HEAD WITHOUT CONTRAST TECHNIQUE: Multiplanar, multiecho pulse sequences of the brain and surrounding structures were obtained without and with intravenous contrast. Angiographic images of the head were obtained using MRA technique without  contrast. CONTRAST:  8 mL Gadavist COMPARISON:  Brain MRI 06/29/2018 CTA head neck 07/22/2018 FINDINGS: MRI HEAD FINDINGS BRAIN: Multifocal bilateral acute ischemia scattered throughout both hemispheres and multiple vascular territories, including the right cerebellum. The largest areas of ischemia are in the posterior cerebral artery territories. 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 There is moderate edema within the affected areas. There is mild leptomeningeal contrast enhancement within both PCA territories and within the left basal ganglia. The midline structures are normal. The white matter signal is normal for the patient's age. The CSF spaces are normal for age, with no hydrocephalus. Susceptibility-sensitive sequences show no chronic microhemorrhage or superficial siderosis. SKULL AND UPPER CERVICAL SPINE: The visualized skull base, calvarium, upper cervical spine and extracranial soft tissues are normal. SINUSES/ORBITS: No fluid levels or advanced mucosal thickening. No mastoid or middle ear effusion. The orbits are normal. MRA HEAD FINDINGS POSTERIOR CIRCULATION: --Basilar artery: Normal. --Posterior cerebral arteries:  Patent proximally. Multifocal moderate-to-severe stenosis of the distal P2 segments. --Superior cerebellar arteries: Normal. --Inferior cerebellar arteries: Normal anterior and posterior inferior cerebellar arteries. ANTERIOR CIRCULATION: --Intracranial internal carotid arteries: Normal. --Anterior cerebral arteries: Normal. Both A1 segments are present. Patent anterior communicating artery. --Middle cerebral arteries: Normal. --Posterior communicating arteries: Absent or diminutive IMPRESSION: 1. Multifocal acute ischemia throughout multiple vascular territories in a pattern most consistent with emboli from a central cardiac or aortic source. The largest areas  of ischemia are within the posterior cerebral artery territories. 2. No acute hemorrhage, herniation or hydrocephalus. 3. 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 4. No proximal intracranial arterial occlusion. Electronically Signed   By: Deatra Robinson M.D.   On: 07/23/2018 16:08   Dg Chest Port 1 View  Result Date: 07/26/2018 CLINICAL DATA:  Extubated EXAM: PORTABLE CHEST 1 VIEW COMPARISON:  Chest radiograph from one day prior. FINDINGS: Interval extubation and removal of enteric tube. Partially visualized surgical hardware overlying the lower cervical spine. Stable cardiomediastinal silhouette with normal heart size. No pneumothorax. No pleural effusion. No pulmonary edema. Mild hazy and reticular opacities at the lung bases, left greater than right, not appreciably changed. IMPRESSION: Interval extubation and removal of enteric tube. Stable mild hazy and reticular opacities at the lung bases, left greater than right. Electronically Signed   By: Delbert Phenix M.D.   On: 07/26/2018 08:34   Dg Chest Port 1 View  Result Date: 07/25/2018 CLINICAL DATA:  Ventilator dependent respiratory failure. Follow-up LEFT LOWER LOBE atelectasis and/or pneumonia. EXAM: PORTABLE CHEST 1 VIEW COMPARISON:  07/24/2018 and earlier. FINDINGS: Endotracheal tube tip in satisfactory position approximately 3 cm above carina. Gastric tube courses below the diaphragm into the stomach. Heart size normal for AP portable technique. Mild airspace consolidation in the LEFT LOWER LOBE, unchanged. New linear atelectasis in the LEFT UPPER LOBE and the BILATERAL lung bases. No confluent airspace consolidation elsewhere in either lung. IMPRESSION: 1. Support apparatus satisfactory. 2. Stable mild LEFT LOWER LOBE pneumonia. 3. New linear atelectasis in the LEFT UPPER LOBE and BILATERAL lung bases. Electronically Signed   By: Hulan Saas M.D.   On: 07/25/2018 07:54   Dg Chest Port 1 View  Result Date:  07/24/2018 CLINICAL DATA:  Intubation. EXAM: PORTABLE CHEST 1 VIEW COMPARISON:  Radiograph yesterday. FINDINGS: Endotracheal tube tip at the thoracic inlet 3.2 cm from the carina. Progressive volume loss in the left hemithorax with increasing basilar and mid lung patchy opacity. Progressive vague opacity in the left upper lung zone, suggesting partial lobar collapse. Right lung base atelectasis. Heart is normal in size. IMPRESSION: 1. Endotracheal tube tip at the thoracic inlet 3.2 cm from the carina. 2. Progressive volume loss in the left hemithorax. New vague upper lung zone opacity suggesting partial lobar collapse. Increasing patchy opacities throughout the mid and lower lung zone may be atelectasis, aspiration, or infection. Electronically Signed   By: Narda Rutherford M.D.   On: 07/24/2018 02:31   Dg Chest Port 1 View  Result Date: 07/23/2018 CLINICAL DATA:  Respiratory failure. EXAM: PORTABLE CHEST 1 VIEW COMPARISON:  Radiograph yesterday, lung apices from neck CTA yesterday. FINDINGS: Endotracheal tube tip remains just at the thoracic inlet. Enteric tube in place with tip below the diaphragm, side-port in the distal esophagus. Normal heart size and mediastinal contours. Mild bibasilar atelectasis. No pulmonary edema, pneumothorax, or pleural effusion. Surgical hardware in the cervical spine is partially included. IMPRESSION: 1. Endotracheal tube tip at the thoracic inlet. Enteric tube in place with  tip below the diaphragm, side-port in the distal esophagus. Recommend advancement of cm for optimal placement. 2. Mild bibasilar atelectasis. Ground-glass opacities on neck CTA are not well demonstrated radiographically. Electronically Signed   By: Narda Rutherford M.D.   On: 07/23/2018 02:40   Dg Chest Port 1 View  Result Date: 07/22/2018 CLINICAL DATA:  Hypoxia and altered mental status EXAM: PORTABLE CHEST 1 VIEW COMPARISON:  None. FINDINGS: Endotracheal tube tip is 5.7 cm above the carina. Nasogastric  tube tip is at the gastroesophageal junction. No pneumothorax. There is atelectatic change in the lung bases as well as in the right mid lung. There is no frank edema or consolidation. Heart size and pulmonary vascularity are normal. No adenopathy. No bone lesions. IMPRESSION: Tube positions as described without pneumothorax. Note that nasogastric tube tip is at the gastroesophageal junction. Advise advancing nasogastric tube 8-10 cm. Areas of atelectatic change in the right mid lung and bilateral base regions. No consolidation. Heart size within normal limits. Electronically Signed   By: Bretta Bang III M.D.   On: 07/22/2018 13:59   Dg Abd Portable 1v  Result Date: 07/23/2018 CLINICAL DATA:  Gastric tube placement EXAM: PORTABLE ABDOMEN - 1 VIEW COMPARISON:  07/23/2018 FINDINGS: Gastric tube has been advanced and is now coiled within the body of the stomach. Normal bowel gas pattern. Moderate stool in the colon. No acute skeletal abnormality. IMPRESSION: NG tube has been advanced and is now coiled in the body the stomach. Nonobstructive bowel gas pattern. Electronically Signed   By: Marlan Palau M.D.   On: 07/23/2018 10:06   Dg Abd Portable 1v  Result Date: 07/22/2018 CLINICAL DATA:  Orogastric tube placement. Altered mental status and hypoxia EXAM: PORTABLE ABDOMEN - 1 VIEW COMPARISON:  None. FINDINGS: Orogastric tube tip is at the gastroesophageal junction with the side port in the distal esophagus. There is moderate stool in the colon. There is no bowel dilatation or air-fluid level to suggest bowel obstruction. No free air. There is bibasilar lung atelectatic change. IMPRESSION: Orogastric tube tip at the gastroesophageal junction. Advise advancing orogastric tube 8-10 cm. No bowel obstruction or free air evident. Bibasilar lung atelectatic change. Electronically Signed   By: Bretta Bang III M.D.   On: 07/22/2018 14:00   Dg Swallowing Func-speech Pathology  Result Date:  07/28/2018 Objective Swallowing Evaluation: Type of Study: MBS-Modified Barium Swallow Study  Patient Details Name: Keone Kamer MRN: [id number] Date of Birth: 1970-12-20 Today's Date: 07/28/2018 Time: SLP Start Time (ACUTE ONLY): 1330 -SLP Stop Time (ACUTE ONLY): 1350 SLP Time Calculation (min) (ACUTE ONLY): 20 min Past Medical History: Past Medical History: Diagnosis Date  Anxiety   Depression   Diabetes mellitus without complication (HCC)   Type II  GERD (gastroesophageal reflux disease)   H/O seasonal allergies   Headache   Hyperlipidemia   Hypertension   Hypothyroidism   Neuropathy   Numbness and tingling of both lower extremities   PTSD (post-traumatic stress disorder)   Sleep apnea   Thyroid disease  Past Surgical History: Past Surgical History: Procedure Laterality Date  ANTERIOR CERVICAL DECOMP/DISCECTOMY FUSION N/A 07/15/2018  Procedure: ANTERIOR CERVICAL DECOMPRESSION/DISCECTOMY FUSION TWO LEVELS WITH CERVICAL SIX CORPECTOMY;  Surgeon: Estill Bamberg, MD;  Location: [facility] OR;  Service: Orthopedics;  Laterality: N/A;  HERNIA REPAIR    child  POSTERIOR CERVICAL FUSION/FORAMINOTOMY  07/16/2018  Procedure: Posterior Cervical Fusion/Foraminotomy Level 2;  Surgeon: Estill Bamberg, MD;  Location: [facility] OR;  Service: Orthopedics;; HPI: Pt is a 48 yo male s/p  anterior/posterior CDF 4/1. Presented from hotel room with AMS, hypoxia and right sided weakness. Intubated 4/8-4/11. MRI multifocal bilateral infarcts, right cerebellum, posterior cerebral artery, left basal gangles. Also fouind to have bacteremia, sepsis and DKA. Pt PMH: DM, anxiety, depression, HTN. CXR interval extubation and removal of enteric tube. Stable mild hazy and reticular opacities at the lung bases, left greater than right.  No data recorded Assessment / Plan / Recommendation CHL IP CLINICAL IMPRESSIONS 07/28/2018 Clinical Impression Pt demonstrated moderate-severe oropharyngeal dysphagia with penetration and brief aspiration with puree  texture. Pt's anterior hyolaryngeal elevation was significantly decreased, no discernable epiglottic deflection or pharyngeal contraction attributed to silent penetration and brief aspiration of puree texture, max vallecular residue. Pt produced a delayed cough after approximately 4 minutes. Oral phase marked by lingual pumping, prolonged transit and lingual residue. Pt requires extended amount of time to produce dry swallow and he cannot produce effective cough to clear penetrates. Due to limitations in pt's cognition and neck ROM, strategies were not attempted. He is not safe for po's and should continue non oral means of nutrition. Prognosis is good given age but may be extended given effects of A/P CDF and stroke on swallow function. Will continue intervention.     SLP Visit Diagnosis Dysphagia, oropharyngeal phase (R13.12) Attention and concentration deficit following -- Frontal lobe and executive function deficit following -- Impact on safety and function Moderate aspiration risk   CHL IP TREATMENT RECOMMENDATION 07/28/2018 Treatment Recommendations Therapy as outlined in treatment plan below   Prognosis 07/28/2018 Prognosis for Safe Diet Advancement Good Barriers to Reach Goals Severity of deficits Barriers/Prognosis Comment -- CHL IP DIET RECOMMENDATION 07/28/2018 SLP Diet Recommendations NPO Liquid Administration via -- Medication Administration Via alternative means Compensations -- Postural Changes --   CHL IP OTHER RECOMMENDATIONS 07/28/2018 Recommended Consults -- Oral Care Recommendations Oral care QID Other Recommendations --   CHL IP FOLLOW UP RECOMMENDATIONS 07/28/2018 Follow up Recommendations Skilled Nursing facility   [facility] IP FREQUENCY AND DURATION 07/28/2018 Speech Therapy Frequency (ACUTE ONLY) min 2x/week Treatment Duration 2 weeks      CHL IP ORAL PHASE 07/28/2018 Oral Phase Impaired Oral - Pudding Teaspoon -- Oral - Pudding Cup -- Oral - Honey Teaspoon -- Oral - Honey Cup -- Oral - Nectar Teaspoon  -- Oral - Nectar Cup -- Oral - Nectar Straw -- Oral - Thin Teaspoon -- Oral - Thin Cup -- Oral - Thin Straw -- Oral - Puree Delayed oral transit;Lingual pumping;Decreased bolus cohesion;Lingual/palatal residue;Reduced posterior propulsion Oral - Mech Soft -- Oral - Regular -- Oral - Multi-Consistency -- Oral - Pill -- Oral Phase - Comment --  CHL IP PHARYNGEAL PHASE 07/28/2018 Pharyngeal Phase Impaired Pharyngeal- Pudding Teaspoon -- Pharyngeal -- Pharyngeal- Pudding Cup -- Pharyngeal -- Pharyngeal- Honey Teaspoon -- Pharyngeal -- Pharyngeal- Honey Cup -- Pharyngeal -- Pharyngeal- Nectar Teaspoon -- Pharyngeal -- Pharyngeal- Nectar Cup -- Pharyngeal -- Pharyngeal- Nectar Straw -- Pharyngeal -- Pharyngeal- Thin Teaspoon -- Pharyngeal -- Pharyngeal- Thin Cup -- Pharyngeal -- Pharyngeal- Thin Straw -- Pharyngeal -- Pharyngeal- Puree Penetration/Aspiration during swallow;Reduced airway/laryngeal closure;Reduced laryngeal elevation;Reduced epiglottic inversion;Reduced anterior laryngeal mobility Pharyngeal Material enters airway, passes BELOW cords then ejected out Pharyngeal- Mechanical Soft -- Pharyngeal -- Pharyngeal- Regular -- Pharyngeal -- Pharyngeal- Multi-consistency -- Pharyngeal -- Pharyngeal- Pill -- Pharyngeal -- Pharyngeal Comment --  CHL IP CERVICAL ESOPHAGEAL PHASE 07/28/2018 Cervical Esophageal Phase WFL Pudding Teaspoon -- Pudding Cup -- Honey Teaspoon -- Honey Cup -- Nectar Teaspoon -- Nectar Cup -- Nectar Straw --  Thin Teaspoon -- Thin Cup -- Thin Straw -- Puree -- Mechanical Soft -- Regular -- Multi-consistency -- Pill -- Cervical Esophageal Comment -- [name] 07/28/2018, 4:24 PM [name] [name] administrator Pager [phone] Office [phone]              Dg C-arm 1-60 Min  Result Date: 07/16/2018 CLINICAL DATA:  Cervical fusion. EXAM: CERVICAL SPINE - 2-3 VIEW; DG C-ARM 61-120 MIN COMPARISON:  CT cervical spine 05/07/2018. SOS MRI March 2020 FINDINGS: The  patient has undergone C5-C7 fusion with C6 corpectomy, With lateral mass screws C5 and C7 connected by rods. IMPRESSION: C-arm spot films document   C5-C7 anterior and posterior fusion Electronically Signed   By: Elsie Stain M.D.   On: 07/16/2018 15:04   Dg C-arm 1-60 Min  Result Date: 07/15/2018 CLINICAL DATA:  Cervical fusion EXAM: CERVICAL SPINE - 2-3 VIEW; DG C-ARM 61-120 MIN COMPARISON:  05/07/2018 FLUOROSCOPY TIME:  Fluoroscopy Time:  30 seconds Radiation Exposure Index (if provided by the fluoroscopic device): Not available Number of Acquired Spot Images: 3 FINDINGS: There are changes of corpectomy identified at C6 with interbody fusion at C5-6 and C6-7. Anterior fusion is noted. IMPRESSION: Changes of cervical fusion. Electronically Signed   By: Alcide Clever M.D.   On: 07/15/2018 16:24   Ct Head Code Stroke Wo Contrast`  Result Date: 07/22/2018 CLINICAL DATA:  Code stroke.  Altered level of consciousness. EXAM: CT HEAD WITHOUT CONTRAST TECHNIQUE: Contiguous axial images were obtained from the base of the skull through the vertex without intravenous contrast. COMPARISON:  MRI head 06/29/2018, CT head 05/07/2018 FINDINGS: Brain: Ventricle size normal. Negative for acute infarct, hemorrhage, mass. No edema or midline shift. Vascular: Negative for hyperdense vessel Skull: Negative Sinuses/Orbits: Negative Other: None ASPECTS ([place] Stroke Program Early CT Score) - Ganglionic level infarction (caudate, lentiform nuclei, internal capsule, insula, M1-M3 cortex): 7 - Supraganglionic infarction (M4-M6 cortex): 3 Total score (0-10 with 10 being normal): 10 IMPRESSION: 1. No acute abnormality 2. ASPECTS is 10 Electronically Signed   By: Marlan Palau M.D.   On: 07/22/2018 13:00   Vas [place] Lower Extremity Venous (dvt)  Result Date: 07/24/2018  Lower Venous Study Indications: Stroke.  Performing Technologist: Blanch Media RVS  Examination Guidelines: A complete evaluation includes B-mode imaging, spectral  Doppler, color Doppler, and power Doppler as needed of all accessible portions of each vessel. Bilateral testing is considered an integral part of a complete examination. Limited examinations for reoccurring indications may be performed as noted.  Right Venous Findings: +---------+---------------+---------+-----------+----------+-------+            Compressibility Phasicity Spontaneity Properties Summary  +---------+---------------+---------+-----------+----------+-------+  CFV       Full            Yes       Yes                             +---------+---------------+---------+-----------+----------+-------+  SFJ       Full                                                      +---------+---------------+---------+-----------+----------+-------+  FV Prox   Full                                                      +---------+---------------+---------+-----------+----------+-------+  FV Mid    Full                                                      +---------+---------------+---------+-----------+----------+-------+  FV Distal Full                                                      +---------+---------------+---------+-----------+----------+-------+  PFV       Full                                                      +---------+---------------+---------+-----------+----------+-------+  POP       Full            Yes       Yes                             +---------+---------------+---------+-----------+----------+-------+  PTV       Full                                                      +---------+---------------+---------+-----------+----------+-------+  PERO      Full                                                      +---------+---------------+---------+-----------+----------+-------+  Left Venous Findings: +---------+---------------+---------+-----------+----------+-------+            Compressibility Phasicity Spontaneity Properties Summary  +---------+---------------+---------+-----------+----------+-------+   CFV       Full            Yes       Yes                             +---------+---------------+---------+-----------+----------+-------+  SFJ       Full                                                      +---------+---------------+---------+-----------+----------+-------+  FV Prox   Full                                                      +---------+---------------+---------+-----------+----------+-------+  FV Mid    Full                                                      +---------+---------------+---------+-----------+----------+-------+  FV Distal Full                                                      +---------+---------------+---------+-----------+----------+-------+  PFV       Full                                                      +---------+---------------+---------+-----------+----------+-------+  POP       Full            Yes       Yes                             +---------+---------------+---------+-----------+----------+-------+  PTV       Full                                                      +---------+---------------+---------+-----------+----------+-------+  PERO      Full                                                      +---------+---------------+---------+-----------+----------+-------+    Summary: Right: There is no evidence of deep vein thrombosis in the lower extremity. No cystic structure found in the popliteal fossa. Left: There is no evidence of deep vein thrombosis in the lower extremity. No cystic structure found in the popliteal fossa.  *See table(s) above for measurements and observations. Electronically signed by Fabienne Bruns MD on 07/24/2018 at 6:57:15 PM.    Final     Labs:  CBC: Recent Labs    07/27/18 0511 07/28/18 0431 07/29/18 0342 07/30/18 0521  WBC 14.4* 13.6* 11.6* 9.9  HGB 10.4* 10.0* 9.3* 10.3*  HCT 31.2* 29.5* 27.2* 30.1*  PLT 405* 506* 590* 747*    COAGS: Recent Labs    07/22/18 1234 07/29/18 1316  INR 1.2 1.0  APTT 31  --      BMP: Recent Labs    07/26/18 0549 07/28/18 0431 07/29/18 0342 07/30/18 0521  NA 136 135 137 134*  K 3.7 3.7 3.7 3.6  CL 97* 102 101 96*  CO2 27 25 27 29   GLUCOSE 114* 150* 70 78  BUN 12 16 18 16   CALCIUM 9.4 8.6* 8.7* 9.1  CREATININE 0.76 0.85 0.76 0.75  GFRNONAA >60 >60 >60 >60  GFRAA >60 >60 >60 >60    LIVER FUNCTION TESTS: Recent Labs    07/22/18 1234 07/24/18 0014 07/25/18 0350 07/26/18 0549  BILITOT 2.1*  --   --  0.7  AST 40  --   --  44*  ALT 35  --   --  25  ALKPHOS 121  --   --  93  PROT 8.1  --   --  6.7  ALBUMIN 4.1 3.1* 2.7* 2.9*    TUMOR MARKERS: No results for input(s):  AFPTM, CEA, CA199, CHROMGRNA in the last 8760 hours.  Assessment and Plan:  CVA Dysphagia Deconditioning To SNF asap Scheduled for percutaneous gastric tube placement Risks and benefits discussed with the patient including, but not limited to the need for a barium enema during the procedure, bleeding, infection, peritonitis, or damage to adjacent structures.  All of the patient's questions were answered, patient is agreeable to proceed. Consent signed and in chart.   Thank you for this interesting consult.  I greatly enjoyed meeting Toma Troy SineBillings and look forward to participating in their care.  A copy of this report was sent to the requesting provider on this date.  Electronically Signed: Robet LeuPamela A Wandalee Klang, PA-C 07/30/2018, 9:30 AM   I spent a total of 20 Minutes    in face to face in clinical consultation, greater than 50% of which was counseling/coordinating care for G tube placement

## 2018-07-30 NOTE — Progress Notes (Signed)
Received phone call from pts [employer] coach. She asked that patient be faxed out to [facility] and [facility]. Information already sent to [facility]. CM faxed pt out and will update CSW in the am.

## 2018-07-30 NOTE — Progress Notes (Signed)
Pt s/p PEG. Once medically ready he will d/c to [facility]. CSW and CM following.

## 2018-07-30 NOTE — Progress Notes (Signed)
Patient off unit for procedure.

## 2018-07-30 NOTE — Progress Notes (Signed)
    Patient has just returned from receiving peg tube and is laying in bed resting comfortably. He reports minimal shoulder or neck pain at this moment.  Physical Exam: Vitals:   07/30/18 1030 07/30/18 1035  BP: (!) 176/97 (!) 181/94  Pulse: 83 81  Resp: 15 18  Temp:    SpO2: 100% 98%    Sutures in place over posterior neck. Running nylon was removed without difficulty and mepalex placed. Steri strips falling off and removed over anterior incision  From standpoint of cervical surgery he is still awaiting placement of his hard aspen collar ordered yesterday, a second stat order has been placed today as well as order for philly collar he can be bathed in.   - PT/OT to continue to work on mobility and transfers. Plan on SNF - will need to continue to awaiting any gains in strength - patient's posterior wound healing well, stitch removed today - OK to bathe normally from spine surgical team standpoint. Philly collar for bathing order placed - additional management per medical team - will continue to follow

## 2018-07-30 NOTE — Progress Notes (Signed)
Orthopedic Tech Progress Note Patient Details:  Franciscojavier Casaus 08-Nov-1970 [id number] RN said he would have the secretary call down and get one from SPD  Patient ID: Azekiel Padley, male   DOB: 10-19-1970, 48 y.o.   MRN: [id number]   Donald Pore 07/30/2018, 11:54 AM

## 2018-07-31 DIAGNOSIS — J9601 Acute respiratory failure with hypoxia: Secondary | ICD-10-CM | POA: Diagnosis not present

## 2018-07-31 LAB — GLUCOSE, CAPILLARY
Glucose-Capillary: 109 mg/dL — ABNORMAL HIGH (ref 70–99)
Glucose-Capillary: 138 mg/dL — ABNORMAL HIGH (ref 70–99)
Glucose-Capillary: 148 mg/dL — ABNORMAL HIGH (ref 70–99)
Glucose-Capillary: 151 mg/dL — ABNORMAL HIGH (ref 70–99)
Glucose-Capillary: 72 mg/dL (ref 70–99)
Glucose-Capillary: 82 mg/dL (ref 70–99)

## 2018-07-31 MED ORDER — INSULIN DETEMIR 100 UNIT/ML ~~LOC~~ SOLN
13.0000 [IU] | Freq: Two times a day (BID) | SUBCUTANEOUS | Status: DC
  Administered 2018-07-31: 13 [IU] via SUBCUTANEOUS
  Filled 2018-07-31 (×4): qty 0.13

## 2018-07-31 MED ORDER — INSULIN ASPART 100 UNIT/ML ~~LOC~~ SOLN: 4.0000 [IU] | Freq: Three times a day (TID) | SUBCUTANEOUS | Status: DC

## 2018-07-31 NOTE — Progress Notes (Signed)
CSW received a phone call from Tammy with [employer] regarding a few questions about the patient. She wanted to know if disability had been applied for, she knows he has a payer but was concerned how much workman's comp would pay for. CSW shared that he does have a workman's comp Sports coach and CSW shared her contact information.   Tammy stated that they do not currently have a bed available but may have one open up early next week. She will keep the CSW updated.   CSW will continue to follow.   Drucilla Schmidt, MSW, LCSW-A Clinical Social Worker [facility] [employer]

## 2018-07-31 NOTE — Progress Notes (Signed)
Patient ID: Corey Li, male   DOB: 1971/01/07, 48 y.o.   MRN: [id number]   IR Round Note via phone due to new regulations  G tube was placed in IR yesterday RN states site is clean and dry No bleeding No hematoma No sign of infection  Afebrile  May use G tube now Orders in place

## 2018-07-31 NOTE — Progress Notes (Signed)
Inpatient Rehab Admissions:  Inpatient Rehab Consult received.  This AC reviewed pt's case on 07/28/2018.  Pt with no family support in this area.  Has one friend that lives on the [facility].  Pt not appropriate candidate for rehab.  Will sign off at this time.    Signed: Estill Dooms, PT, DPT Admissions Coordinator [phone] 07/31/18  2:01 PM

## 2018-07-31 NOTE — Progress Notes (Signed)
Occupational Therapy Treatment Patient Details Name: Corey Li MRN: [id number] DOB: 1970/06/09 Today's Date: 07/31/2018    History of present illness Pt is a 48 yo male s/p multifocal infarcts, bacteremia, sepsis and DKA. Pt PMHx: 4/1 A/P CDF C5-7, DM, anxiety, depression, HTN.   OT comments  This 48 yo male admitted with above presents to acute OT with doing better with overall mobility (but still a ways to go), standing today, OOB today (but not safe to leave OOB due to pt sliding down in recliner). Heat to both shoulders today at end of session due to pt c/o shoulder pain. Pt will continue to benefit from acute OT with follow up OT at SNF.   Follow Up Recommendations  SNF;Supervision/Assistance - 24 hour    Equipment Recommendations  Other (comment)(TBD next venue)       Precautions / Restrictions Precautions Precautions: Cervical;Fall Precaution Comments: G-tube Required Braces or Orthoses: Cervical Brace Cervical Brace: Hard collar;At all times Restrictions Weight Bearing Restrictions: No       Mobility Bed Mobility Overal bed mobility: Needs Assistance Bed Mobility: Rolling;Sidelying to Sit;Sit to Sidelying Rolling: Min assist;Max assist;+2 for physical assistance Sidelying to sit: Max assist;+2 for physical assistance;HOB elevated       General bed mobility comments: rolling right with min A; rolling left max A; side lying from right side Max A  Transfers Overall transfer level: Needs assistance   Transfers: Sit to/from Stand Sit to Stand: Mod assist;+2 physical assistance;Total assist         General transfer comment: started at Mod A +2 sit>stand, but progressed to total A +2 due to lean to right and anteriorly and not correcting on his own    Balance Overall balance assessment: Needs assistance Sitting-balance support: Single extremity supported;Feet supported   Sitting balance - Comments: varied between poor and zero due to not aware of balance  deficits and wanting to lay back down but unsafe to do so by himself   Standing balance support: Bilateral upper extremity supported   Standing balance comment: varied from poor to zero with tendency to lean anteriorly and to right                                  Vision Baseline Vision/History: No visual deficits Patient Visual Report: No change from baseline            Cognition Arousal/Alertness: Awake/alert(but keeping eyes closed most of session) Behavior During Therapy: Flat affect Overall Cognitive Status: Impaired/Different from baseline Area of Impairment: Attention;Following commands;Safety/judgement;Problem solving                   Current Attention Level: Focused   Following Commands: Follows one step commands inconsistently Safety/Judgement: Decreased awareness of safety;Decreased awareness of deficits   Problem Solving: Slow processing;Decreased initiation;Difficulty sequencing;Requires tactile cues;Requires verbal cues General Comments: pt able to follow commands with L UE, requires increased time and multimodal cueing with other commands (at times question if he was just not trying due to he wanted to lay back down almost as soon as he sat up on EOB--would follow a command and then the next time we asked the same thing he would not); decreased safety with just wanting  to lay over on his side and not bring his legs up on bed.        Exercises Other Exercises Other Exercises: Pt has limited movement of LUE on his  own, can almost get elbow fully extended if weight of arm supported, reports painful shoulder above 45 degrees of flexion. RUE remains flaccid.           Pertinent Vitals/ Pain       Pain Assessment: Faces Faces Pain Scale: Hurts even more Pain Location: first left shoulder and neck (supine); both shoulders, nack, and back (sitting EOB) Pain Descriptors / Indicators: Aching;Sore Pain Intervention(s): Limited activity within  patient's tolerance;Monitored during session;Repositioned;RN gave pain meds during session         Frequency  Min 3X/week        Progress Toward Goals  OT Goals(current goals can now be found in the care plan section)  Progress towards OT goals: Progressing toward goals     Plan Discharge plan remains appropriate;Frequency remains appropriate    Co-evaluation    PT/OT/SLP Co-Evaluation/Treatment: Yes Reason for Co-Treatment: Complexity of the patient's impairments (multi-system involvement);For patient/therapist safety PT goals addressed during session: Mobility/safety with mobility;Balance OT goals addressed during session: Strengthening/ROM      AM-PAC OT "6 Clicks" Daily Activity     Outcome Measure   Help from another person eating meals?: Total Help from another person taking care of personal grooming?: Total Help from another person toileting, which includes using toliet, bedpan, or urinal?: Total Help from another person bathing (including washing, rinsing, drying)?: Total Help from another person to put on and taking off regular upper body clothing?: Total Help from another person to put on and taking off regular lower body clothing?: Total 6 Click Score: 6    End of Session Equipment Utilized During Treatment: Gait belt(saran stedy)  OT Visit Diagnosis: Unsteadiness on feet (R26.81);Muscle weakness (generalized) (M62.81);Hemiplegia and hemiparesis;Other symptoms and signs involving cognitive function;Other symptoms and signs involving the nervous system (R29.898) Hemiplegia - Right/Left: Right Hemiplegia - dominant/non-dominant: Dominant Hemiplegia - caused by: Cerebral infarction   Activity Tolerance Patient limited by fatigue;Patient limited by pain   Patient Left in bed;with call bell/phone within reach;with bed alarm set           Time: [id number] OT Time Calculation (min): 50 min  Charges: OT General Charges $OT Visit: 1 Visit OT  Treatments $Therapeutic Activity: 23-37 mins  Ignacia Palmaathy Kaytlan Behrman, OTR/L [facility] Pager [phone] Office [phone]     Evette GeorgesLeonard, Tennie Grussing Eva 07/31/2018, 10:38 AM

## 2018-07-31 NOTE — Progress Notes (Signed)
  Speech Language Pathology Treatment: Dysphagia  Patient Details Name: Corey Li MRN: [id number] DOB: 23-Jul-1970 Today's Date: 07/31/2018 Time: 9373-4287 SLP Time Calculation (min) (ACUTE ONLY): 12 min  Assessment / Plan / Recommendation Clinical Impression  Pt agreeable to therapy but reported pain when repositioned upright. He was sleeping when SLP arrived; worked with PT/OT earlier and needed encouragement. Ice chip provided following oral care with little manipulation and ice chip transited rapidly and swallowed mostly intact. His pain became unbearable, rolled over, SLP lowered head and notified RN of pt's pain. Earlier in session therapist reviewed swallow status and goals. Pt expressed frustration over situation- speech dysarthric (would benefit from eval for speech-cog). Continue intervention for swallow on acute and at next venue.    HPI HPI: Pt is a 48 yo male s/p anterior/posterior CDF 4/1. Presented from hotel room with AMS, hypoxia and right sided weakness. Intubated 4/8-4/11. MRI multifocal bilateral infarcts, right cerebellum, posterior cerebral artery, left basal gangles. Also fouind to have bacteremia, sepsis and DKA. Pt PMH: DM, anxiety, depression, HTN. CXR interval extubation and removal of enteric tube. Stable mild hazy and reticular opacities at the lung bases, left greater than right.      SLP Plan  Continue with current plan of care       Recommendations  Diet recommendations: NPO Medication Administration: Via alternative means(PEG 4/16)                Oral Care Recommendations: Oral care QID Follow up Recommendations: Skilled Nursing facility SLP Visit Diagnosis: Dysphagia, oropharyngeal phase (R13.12) Plan: Continue with current plan of care                       Corey Li 07/31/2018, 4:20 PM  Corey Li.Ed Nurse, children's [phone] Office [phone]

## 2018-07-31 NOTE — Plan of Care (Signed)
  Problem: Health Behavior/Discharge Planning: Goal: Ability to manage health-related needs will improve Outcome: Not Progressing   Problem: Education: Goal: Knowledge of General Education information will improve Description Including pain rating scale, medication(s)/side effects and non-pharmacologic comfort measures Outcome: Not Progressing   Problem: Education: Goal: Knowledge of General Education information will improve Description Including pain rating scale, medication(s)/side effects and non-pharmacologic comfort measures Outcome: Not Progressing   Problem: Health Behavior/Discharge Planning: Goal: Ability to manage health-related needs will improve Outcome: Not Progressing

## 2018-07-31 NOTE — Progress Notes (Signed)
Physical Therapy Treatment Patient Details Name: Corey Li MRN: [id number] DOB: 1971-03-06 Today's Date: 07/31/2018    History of Present Illness Pt is a 48 yo male s/p multifocal infarcts, bacteremia, sepsis and DKA. Pt PMHx: 4/1 A/P CDF C5-7, DM, anxiety, depression, HTN.    PT Comments    Patient seen for mobility progression. Hard cervical collar donned upon arrival. Pt is making progress toward PT goals and able to progress to functional transfer training and working on standing balance. Pt is able to sit to stand with mod A +2 and Stedy standing frame utilized for transfer to recliner and for increased safety with balance activities.  Pt is much more alert and communicative today versus previous sessions. Pt with c/o bilat shoulder (L > R) and neck pain with mobility. Given pt's progress over this week recommending CIR for further skilled PT services to maximize independence and safety with mobility.    Follow Up Recommendations  CIR     Equipment Recommendations  Other (comment)(TBD next session)    Recommendations for Other Services       Precautions / Restrictions Precautions Precautions: Cervical;Fall Precaution Comments: G-tube Required Braces or Orthoses: Cervical Brace Cervical Brace: Hard collar;At all times Restrictions Weight Bearing Restrictions: No    Mobility  Bed Mobility Overal bed mobility: Needs Assistance Bed Mobility: Rolling;Sidelying to Sit;Sit to Sidelying Rolling: Min assist;Max assist;+2 for physical assistance Sidelying to sit: Max assist;+2 for physical assistance;HOB elevated     Sit to sidelying: Total assist;+2 for physical assistance General bed mobility comments: cues for sequencing and for use of rail; rolling right with min A; rolling left max A; side lying from right side Max A  Transfers Overall transfer level: Needs assistance   Transfers: Sit to/from Stand Sit to Stand: Mod assist;+2 physical assistance;Total assist          General transfer comment: mod A +2 to power up into standing and gait balanace upon standing; at times pt required significantly increased assist despite cues; mulitmodal cues for upright posture and weight shifting; assistance required bilat UE positioning on Stedy standing frame however able to grip with L hand once positioned; pt stood once from EOB with HHA +2 and R knee did not buckle however unable to attempt pre gait without standing frame; pt tranfered to recliner but kept sliding himself down in the chair so transfered back to bed using maxi move hoyer lift for safety of patient   Ambulation/Gait                 Stairs             Wheelchair Mobility    Modified Rankin (Stroke Patients Only)       Balance Overall balance assessment: Needs assistance Sitting-balance support: Single extremity supported;Feet supported Sitting balance-Leahy Scale: Poor Sitting balance - Comments: pt able to maintain sitting balance EOB with min guard/min A for longer preiods of time until either fatigued or just adiment about laying down Postural control: Right lateral lean Standing balance support: Bilateral upper extremity supported   Standing balance comment: able to work on weight shifting in standing utilizing standing frame; varied from poor to zero with tendency to lean anteriorly and to right                            Cognition Arousal/Alertness: Awake/alert(but keeping eyes closed most of session) Behavior During Therapy: Flat affect Overall Cognitive Status: Impaired/Different from baseline  Area of Impairment: Attention;Following commands;Safety/judgement;Problem solving;Awareness                   Current Attention Level: Focused   Following Commands: Follows one step commands inconsistently;Follows one step commands with increased time Safety/Judgement: Decreased awareness of safety;Decreased awareness of deficits Awareness:  Intellectual Problem Solving: Slow processing;Decreased initiation;Difficulty sequencing;Requires tactile cues;Requires verbal cues General Comments: pt able to follow commands with L UE, requires increased time and multimodal cueing with other commands (at times question if he was just not trying due to he wanted to lay back down almost as soon as he sat up on EOB--would follow a command and then the next time we asked the same thing he would not); decreased safety with just wanting  to lay over on his side and not bring his legs up on bed.      Exercises Other Exercises Other Exercises: Pt has limited movement of LUE on his own, can almost get elbow fully extended if weight of arm supported, reports painful shoulder above 45 degrees of flexion. RUE remains flaccid.    General Comments        Pertinent Vitals/Pain Pain Assessment: Faces Faces Pain Scale: Hurts even more Pain Location: L shoulder upon arrival and with mobility bilat shoulders, neck, and back Pain Descriptors / Indicators: Aching;Sore;Grimacing;Guarding Pain Intervention(s): Limited activity within patient's tolerance;Monitored during session;Repositioned    Home Living                      Prior Function            PT Goals (current goals can now be found in the care plan section) Acute Rehab PT Goals Patient Stated Goal: unable to state Progress towards PT goals: Progressing toward goals    Frequency    Min 4X/week      PT Plan Current plan remains appropriate    Co-evaluation PT/OT/SLP Co-Evaluation/Treatment: Yes Reason for Co-Treatment: Complexity of the patient's impairments (multi-system involvement);For patient/therapist safety;To address functional/ADL transfers PT goals addressed during session: Mobility/safety with mobility;Balance;Strengthening/ROM OT goals addressed during session: Strengthening/ROM      AM-PAC PT "6 Clicks" Mobility   Outcome Measure  Help needed turning from  your back to your side while in a flat bed without using bedrails?: A Lot Help needed moving from lying on your back to sitting on the side of a flat bed without using bedrails?: A Lot Help needed moving to and from a bed to a chair (including a wheelchair)?: Total Help needed standing up from a chair using your arms (e.g., wheelchair or bedside chair)?: A Lot Help needed to walk in hospital room?: Total Help needed climbing 3-5 steps with a railing? : Total 6 Click Score: 9    End of Session Equipment Utilized During Treatment: Oxygen;Gait belt;Cervical collar Activity Tolerance: Patient tolerated treatment well Patient left: in bed;with call bell/phone within reach;with bed alarm set Nurse Communication: Mobility status PT Visit Diagnosis: Other abnormalities of gait and mobility (R26.89);Pain Pain - Right/Left: Left Pain - part of body: Arm     Time: 9532-0233 PT Time Calculation (min) (ACUTE ONLY): 50 min  Charges:  $Gait Training: 8-22 mins                     Erline Levine, PTA Acute Rehabilitation Services Pager: [phone] Office: [phone]     Carolynne Edouard 07/31/2018, 12:48 PM

## 2018-07-31 NOTE — Progress Notes (Addendum)
PROGRESS NOTE  Clair GullingOc Charnley  BJY:[id number]:[id number] DOB: [birth year] DOA: 07/22/2018 PCP: Lavinia SharpsPlacey, Mary Ann, NP   Brief Narrative: Clair GullingOc Antonio is a 48 y.o. male with a history of T2DM, HTN who underwent recent A/P CDF (C5-7) on 4/2 who presented with altered mental status and hypoxia with some right-sided weakness to the ED code stroke was called. He appeared septic and had DKA, admitted to ICU, intubated and ultimately extubated on 07/25/2018 transferred to [facility] on 07/26/2018. Therapy evaluations have recommended SNF for placement at discharge, PEG placed 4/16.  Assessment & Plan: Active Problems:   DKA (diabetic ketoacidoses) (HCC)   Encounter for intubation   Acute respiratory failure with hypoxia (HCC)   Cerebral embolism with cerebral infarction  Bilateral ischemic infarcts: Largest bilateral PCA territory including the left ACA, bilateral punctuate MCA and bilateral ACA infarcts likely embolic from cardioembolic source versus hypercoagulable state with DKA: 2D echo showed an EF of 50% negative bubble study. Lower extremity Doppler was negative. - Continue statin, LDL 72 - DM management as below - Normotension is now BP goal.  - Continue ASA 325mg  daily - Neurology was consulted who recommended a cardiac event monitor versus loop recorder to rule out A. Fib as an outpatient. - Consulted Child psychotherapistsocial worker for skilled nursing facility placement, limited by need for workers' comp bed.  Dysphagia: Very high risk aspiration, complication of CVA:  - PEG placed 4/16. Tube management per IR, will start TF's 4/17  Acute respiratory failure with hypoxia possibly due to aspiration pneumonia: Patient extubated on 07/25/2018 transferred to [facility] hospitalist on 07/26/2018. He is low probability for COVID-19. A CTA of the head and neck showed bilateral apices groundglass opacity, there was a concern of mucous plugging on 07/23/2018. Chest x-ray mild opacity at the bases. - Continue unasyn x5 days, WBC improving  since anaerobic coverage included, 4/13 - 4/17  History of anterior/posterior C5-C7 decompression/fusion:  - Orthopedics has followed in the hospital. Appreciate recommendations. Incisions look healthy, continue cervical collar.  DKA, T2DM:HbA1c 10.9%, weaned from insulin gtt.  - Continue levemir and SSI. Elevated CBGs only postprandial. Will further decrease  levemir to 13u BID, increase mealtime 3u TID > 4u TIDWC, continue res SSI   Acute kidney injury: Likely prerenal azotemia resolved with IV fluid hydration creatinine now at baseline.  Acute metabolic encephalopathy/acute confusional state: - Continue prn haldol  CoNS bacteremia: In 1 out of 2 blood culture collections not consistent with true infection.   Essential hypertension: - Continue amlodipine, lisinopril  Hypothyroidism:  - Continue synthroid   Hyperlipidemia: - Continue lipitor  Tobacco abuse: Current smoker. - Cessation counseling to be provided, though will not be able to smoke  Hypophosphatemia: - Replete phosphorus per tube, once the feeding tube is placed.  DVT prophylaxis: Lovenox Code Status: Full Family Communication: None at bedside. Spoke with patient's emergency contact by phone for 16 minutes, who requests updates with any changes in status. Disposition Plan: SNF vs CIR when tube feeds stable through PEG. Otherwise stable for discharge, but will not be until next week per CSW.    Consultants:   Neurology  Orthopedics  CCM  Procedures:   PEG placement 4/16.  Antimicrobials:  Ceftriaxone > unasyn 4/13  Subjective: Some agitation overnight, pulling on IV's, irritated by collar this morning. Abdomen is sore but mildly, no nausea or vomiting. No other complaints from patient.  Objective: Vitals:   07/31/18 0000 07/31/18 0500 07/31/18 0818 07/31/18 0858  BP: 124/87 (!) 121/105 120/80   Pulse:  79  90 83  Resp: Temp: 98.7 F (37.1 C) 97.7 F (36.5 C) (!) 97.5 F (36.4  C)   TempSrc: Oral Axillary Oral   SpO2: 100%  100% 94%  Weight:  77.5 kg    Height:        Intake/Output Summary (Last 24 hours) at 07/31/2018 1224 Last data filed at 07/31/2018 0600 Gross per 24 hour  Intake 1200 ml  Output 951 ml  Net 249 ml   Filed Weights   07/26/18 0500 07/27/18 0500 07/31/18 0500  Weight: 71.7 kg 71.9 kg 77.5 kg   Gen: 48 y.o. male in no distress Pulm: Nonlabored breathing room air. Clear. CV: Regular rate and rhythm. No murmur, rub, or gallop. No JVD, no dependent edema. GI: Abdomen soft, PEG site c/d/i, no erythema or discharge, mildly appropriately tender, +BS.   Ext: Warm, no deformities Skin: No new rashes, lesions or ulcers on visualized skin, except PEG site as above. Neck incision sites appear well healing. Neuro: Alert. Stable right HP  Data Reviewed: I have personally reviewed following labs and imaging studies  CBC: Recent Labs  Lab 07/26/18 0549 07/27/18 0511 07/28/18 0431 07/29/18 0342 07/30/18 0521  WBC 16.5* 14.4* 13.6* 11.6* 9.9  NEUTROABS 14.1* 11.9* 10.6* 7.6  --   HGB 10.0* 10.4* 10.0* 9.3* 10.3*  HCT 30.7* 31.2* 29.5* 27.2* 30.1*  MCV 91.6 90.4 89.7 90.7 91.2  PLT 329 405* 506* 590* 747*   Basic Metabolic Panel: Recent Labs  Lab 07/25/18 0350 07/26/18 0549 07/27/18 0511 07/28/18 0431 07/29/18 0342 07/30/18 0521  NA 140 136  --  135 137 134*  K 3.6 3.7  --  3.7 3.7 3.6  CL 103 97*  --  102 101 96*  CO2 24 27  --  GLUCOSE 211* 114*  --  150* 70 78  BUN 21* 12  --  CREATININE 0.85 0.76  --  0.85 0.76 0.75  CALCIUM 8.3* 9.4  --  8.6* 8.7* 9.1  MG  --  2.2 2.2 2.2 2.3  --   PHOS 1.6* 2.0* 2.2* 2.6 2.7  --    GFR: Estimated Creatinine Clearance: 111.7 mL/min (by C-G formula based on SCr of 0.75 mg/dL). Liver Function Tests: Recent Labs  Lab 07/25/18 0350 07/26/18 0549  AST  --  44*  ALT  --  25  ALKPHOS  --  93  BILITOT  --  0.7  PROT  --  6.7  ALBUMIN 2.7* 2.9*   No results for  input(s): LIPASE, AMYLASE in the last 168 hours. No results for input(s): AMMONIA in the last 168 hours. Coagulation Profile: Recent Labs  Lab 07/29/18 1316  INR 1.0   Cardiac Enzymes: No results for input(s): CKTOTAL, CKMB, CKMBINDEX, TROPONINI in the last 168 hours. BNP (last 3 results) No results for input(s): PROBNP in the last 8760 hours. HbA1C: No results for input(s): HGBA1C in the last 72 hours. CBG: Recent Labs  Lab 07/30/18 2057 07/31/18 0024 07/31/18 0441 07/31/18 0758 07/31/18 1112  GLUCAP 225* 138* 72 82 151*   Lipid Profile: No results for input(s): CHOL, HDL, LDLCALC, TRIG, CHOLHDL, LDLDIRECT in the last 72 hours. Thyroid Function Tests: No results for input(s): TSH, T4TOTAL, FREET4, T3FREE, THYROIDAB in the last 72 hours. Anemia Panel: No results for input(s): VITAMINB12, FOLATE, FERRITIN, TIBC, IRON, RETICCTPCT in the last 72 hours. Urine analysis:    Component Value Date/Time  COLORURINE YELLOW 07/22/2018 1234   APPEARANCEUR HAZY (A) 07/22/2018 1234   LABSPEC 1.016 07/22/2018 1234   PHURINE 5.0 07/22/2018 1234   GLUCOSEU >=500 (A) 07/22/2018 1234   HGBUR MODERATE (A) 07/22/2018 1234   BILIRUBINUR NEGATIVE 07/22/2018 1234   KETONESUR 20 (A) 07/22/2018 1234   PROTEINUR 100 (A) 07/22/2018 1234   NITRITE NEGATIVE 07/22/2018 1234   LEUKOCYTESUR NEGATIVE 07/22/2018 1234   Recent Results (from the past 240 hour(s))  Urine culture     Status: None   Collection Time: 07/22/18  1:42 PM  Result Value Ref Range Status   Specimen Description URINE, RANDOM  Final   Special Requests NONE  Final   Culture   Final    NO GROWTH Performed at [facility], [address] [place], [place] [address]    Report Status 07/23/2018 FINAL  Final  Blood culture (routine x 2)     Status: Abnormal   Collection Time: 07/22/18  2:29 PM  Result Value Ref Range Status   Specimen Description BLOOD LEFT ANTECUBITAL  Final   Special Requests   Final    BOTTLES DRAWN  AEROBIC AND ANAEROBIC Blood Culture adequate volume   Culture  Setup Time   Final    GRAM POSITIVE COCCI IN BOTH AEROBIC AND ANAEROBIC BOTTLES Organism ID to follow CRITICAL RESULT CALLED TO, READ BACK BY AND VERIFIED WITH: Babs Sciara PharmD 11:50 07/23/18 ([email or web address])    Culture (A)  Final    STAPHYLOCOCCUS SPECIES (COAGULASE NEGATIVE) THE SIGNIFICANCE OF ISOLATING THIS ORGANISM FROM A SINGLE SET OF BLOOD CULTURES WHEN MULTIPLE SETS ARE DRAWN IS UNCERTAIN. PLEASE NOTIFY THE MICROBIOLOGY DEPARTMENT WITHIN ONE WEEK IF SPECIATION AND SENSITIVITIES ARE REQUIRED. Performed at [facility], [address] [place], [place] [address]    Report Status 07/25/2018 FINAL  Final  Blood Culture ID Panel (Reflexed)     Status: Abnormal   Collection Time: 07/22/18  2:29 PM  Result Value Ref Range Status   Enterococcus species NOT DETECTED NOT DETECTED Final   Listeria monocytogenes NOT DETECTED NOT DETECTED Final   Staphylococcus species DETECTED (A) NOT DETECTED Final    Comment: Methicillin (oxacillin) resistant coagulase negative staphylococcus. Possible blood culture contaminant (unless isolated from more than one blood culture draw or clinical case suggests pathogenicity). No antibiotic treatment is indicated for blood  culture contaminants. CRITICAL RESULT CALLED TO, READ BACK BY AND VERIFIED WITH: Babs Sciara PharmD 11:50 07/23/18 ([email or web address])    Staphylococcus aureus (BCID) NOT DETECTED NOT DETECTED Final   Methicillin resistance DETECTED (A) NOT DETECTED Final    Comment: CRITICAL RESULT CALLED TO, READ BACK BY AND VERIFIED WITH: Babs Sciara PharmD 11:50 07/23/18 ([email or web address])    Streptococcus species NOT DETECTED NOT DETECTED Final   Streptococcus agalactiae NOT DETECTED NOT DETECTED Final   Streptococcus pneumoniae NOT DETECTED NOT DETECTED Final   Streptococcus pyogenes NOT DETECTED NOT DETECTED Final   Acinetobacter baumannii NOT DETECTED NOT DETECTED Final   Enterobacteriaceae species NOT  DETECTED NOT DETECTED Final   Enterobacter cloacae complex NOT DETECTED NOT DETECTED Final   Escherichia coli NOT DETECTED NOT DETECTED Final   Klebsiella oxytoca NOT DETECTED NOT DETECTED Final   Klebsiella pneumoniae NOT DETECTED NOT DETECTED Final   Proteus species NOT DETECTED NOT DETECTED Final   Serratia marcescens NOT DETECTED NOT DETECTED Final   Haemophilus influenzae NOT DETECTED NOT DETECTED Final   Neisseria meningitidis NOT DETECTED NOT DETECTED Final   Pseudomonas aeruginosa NOT DETECTED NOT DETECTED Final  Candida albicans NOT DETECTED NOT DETECTED Final   Candida glabrata NOT DETECTED NOT DETECTED Final   Candida krusei NOT DETECTED NOT DETECTED Final   Candida parapsilosis NOT DETECTED NOT DETECTED Final   Candida tropicalis NOT DETECTED NOT DETECTED Final    Comment: Performed at [facility], [address], [place], [place] [address]  Blood culture (routine x 2)     Status: None   Collection Time: 07/22/18  4:26 PM  Result Value Ref Range Status   Specimen Description BLOOD RIGHT ANTECUBITAL  Final   Special Requests   Final    BOTTLES DRAWN AEROBIC ONLY Blood Culture adequate volume   Culture   Final    NO GROWTH 5 DAYS Performed at [facility], [address] [place], [place] [address]    Report Status 07/27/2018 FINAL  Final  Respiratory Panel by PCR     Status: None   Collection Time: 07/22/18  5:08 PM  Result Value Ref Range Status   Adenovirus NOT DETECTED NOT DETECTED Final   Coronavirus 229E NOT DETECTED NOT DETECTED Final    Comment: (NOTE) The Coronavirus on the Respiratory Panel, DOES NOT test for the novel  Coronavirus (2019 nCoV)    Coronavirus HKU1 NOT DETECTED NOT DETECTED Final   Coronavirus NL63 NOT DETECTED NOT DETECTED Final   Coronavirus OC43 NOT DETECTED NOT DETECTED Final   Metapneumovirus NOT DETECTED NOT DETECTED Final   Rhinovirus / Enterovirus NOT DETECTED NOT DETECTED Final   Influenza A NOT DETECTED NOT DETECTED  Final   Influenza B NOT DETECTED NOT DETECTED Final   Parainfluenza Virus 1 NOT DETECTED NOT DETECTED Final   Parainfluenza Virus 2 NOT DETECTED NOT DETECTED Final   Parainfluenza Virus 3 NOT DETECTED NOT DETECTED Final   Parainfluenza Virus 4 NOT DETECTED NOT DETECTED Final   Respiratory Syncytial Virus NOT DETECTED NOT DETECTED Final   Bordetella pertussis NOT DETECTED NOT DETECTED Final   Chlamydophila pneumoniae NOT DETECTED NOT DETECTED Final   Mycoplasma pneumoniae NOT DETECTED NOT DETECTED Final    Comment: Performed at [facility], [address], [place], [place] [address]  MRSA PCR Screening     Status: None   Collection Time: 07/23/18  9:31 AM  Result Value Ref Range Status   MRSA by PCR NEGATIVE NEGATIVE Final    Comment:        The GeneXpert MRSA Assay (FDA approved for NASAL specimens only), is one component of a comprehensive MRSA colonization surveillance program. It is not intended to diagnose MRSA infection nor to guide or monitor treatment for MRSA infections. Performed at [facility], [address], [place], [place] [address]   Culture, respiratory (non-expectorated)     Status: None   Collection Time: 07/23/18 11:42 AM  Result Value Ref Range Status   Specimen Description TRACHEAL ASPIRATE  Final   Special Requests NONE  Final   Gram Stain   Final    NO WBC SEEN FEW BUDDING YEAST SEEN FEW GRAM POSITIVE RODS FEW GRAM POSITIVE COCCI IN PAIRS    Culture   Final    MODERATE CANDIDA TROPICALIS MODERATE GROUP B STREP(S.AGALACTIAE)ISOLATED TESTING AGAINST S. AGALACTIAE NOT ROUTINELY PERFORMED DUE TO PREDICTABILITY OF AMP/PEN/VAN SUSCEPTIBILITY. Performed at [facility], [address], [place], [place] [address]    Report Status 07/25/2018 FINAL  Final      Radiology Studies: Ir Gastrostomy Tube Mod Sed  Result Date: 07/30/2018 INDICATION: 48 year old with  CVA and dysphagia. Scheduled for percutaneous gastrostomy tube. EXAM:  PERCUTANEOUS GASTROSTOMY TUBE WITH FLUOROSCOPIC GUIDANCE Physician: Rachelle Hora. Lowella Dandy, MD MEDICATIONS: Ancef 2 g; Antibiotics were administered within 1 hour of the procedure. Glucagon 1 mg IV ANESTHESIA/SEDATION: Versed 0.5 mg IV; Fentanyl 25 mcg IV Moderate Sedation Time:  17 minutes The patient was continuously monitored during the procedure by the interventional radiology nurse under my direct supervision. FLUOROSCOPY TIME:  Fluoroscopy Time: 4 minutes 36 seconds (25 mGy). COMPLICATIONS: None immediate. PROCEDURE: Informed consent was obtained for a percutaneous gastrostomy tube. The patient was placed on the interventional table. An orogastric tube was placed with fluoroscopic guidance. The anterior abdomen was prepped and draped in sterile fashion. Maximal barrier sterile technique was utilized including caps, mask, sterile gowns, sterile gloves, sterile drape, hand hygiene and skin antiseptic. Stomach was inflated with air through the orogastric tube. The skin and subcutaneous tissues were anesthetized with 1% lidocaine. A 17 gauge needle was directed into the distended stomach with fluoroscopic guidance. A wire was advanced into the stomach and a T-tact was deployed. A 9-French vascular sheath was placed and the orogastric tube was snared using a [employer] snare device. The orogastric tube and snare were pulled out of the patient's mouth. The snare device was connected to a 20-French gastrostomy tube. The snare device and gastrostomy tube were pulled through the patient's mouth and out the anterior abdominal wall. The gastrostomy tube was cut to an appropriate length. Contrast injection through gastrostomy tube confirmed placement within the stomach. Fluoroscopic images were obtained for documentation. The gastrostomy tube was flushed with normal saline. IMPRESSION: Successful fluoroscopic guided percutaneous gastrostomy tube placement. Electronically Signed   By: Richarda Overlie M.D.   On: 07/30/2018 13:46     Scheduled Meds:  amLODipine  10 mg Oral Daily   aspirin EC  325 mg Oral Daily   Or   aspirin  300 mg Rectal Daily   atorvastatin  80 mg Oral Daily   enoxaparin (LOVENOX) injection  40 mg Subcutaneous Q24H   feeding supplement (GLUCERNA 1.2 CAL)  480 mL Per Tube TID   feeding supplement (PRO-STAT SUGAR FREE 64)  30 mL Per Tube BID   insulin aspart  0-20 Units Subcutaneous Q4H   insulin aspart  3 Units Subcutaneous TID WC   insulin detemir  15 Units Subcutaneous Q12H   ipratropium-albuterol  3 mL Nebulization BID   levothyroxine  175 mcg Per Tube QAC breakfast   lisinopril  20 mg Oral Daily   mouth rinse  15 mL Mouth Rinse BID   Continuous Infusions:  sodium chloride 10 mL/hr at 07/23/18 0700   sodium chloride 100 mL/hr at 07/31/18 1023     LOS: 9 days   Time spent: 25 minutes.  Tyrone Nine, MD [facility] [email or web address] Password [facility] 07/31/2018, 12:24 PM

## 2018-07-31 NOTE — Progress Notes (Signed)
Orthopedic Tech Progress Note Patient Details:  Chadwick Kostrzewski Sep 15, 1970 [id number] RN said patient has on his collar Patient ID: Esther Sellars, male   DOB: 09/23/1970, 48 y.o.   MRN: [id number]   Donald Pore 07/31/2018, 7:47 AM

## 2018-08-01 DIAGNOSIS — Z794 Long term (current) use of insulin: Secondary | ICD-10-CM

## 2018-08-01 DIAGNOSIS — E11649 Type 2 diabetes mellitus with hypoglycemia without coma: Secondary | ICD-10-CM

## 2018-08-01 DIAGNOSIS — R1310 Dysphagia, unspecified: Secondary | ICD-10-CM

## 2018-08-01 LAB — BASIC METABOLIC PANEL
Anion gap: 13 (ref 5–15)
BUN: 19 mg/dL (ref 6–20)
CO2: 24 mmol/L (ref 22–32)
Calcium: 9.2 mg/dL (ref 8.9–10.3)
Chloride: 99 mmol/L (ref 98–111)
Creatinine, Ser: 0.65 mg/dL (ref 0.61–1.24)
GFR calc Af Amer: >60 mL/min (ref >60)
GFR calc non Af Amer: >60 mL/min (ref >60)
Glucose, Bld: 72 mg/dL (ref 70–99)
Potassium: 4.2 mmol/L (ref 3.5–5.1)
Sodium: 136 mmol/L (ref 135–145)

## 2018-08-01 LAB — GLUCOSE, CAPILLARY
Glucose-Capillary: 170 mg/dL — ABNORMAL HIGH (ref 70–99)
Glucose-Capillary: 51 mg/dL — ABNORMAL LOW (ref 70–99)
Glucose-Capillary: 89 mg/dL (ref 70–99)
Glucose-Capillary: 119 mg/dL — ABNORMAL HIGH (ref 70–99)
Glucose-Capillary: 67 mg/dL — ABNORMAL LOW (ref 70–99)
Glucose-Capillary: 285 mg/dL — ABNORMAL HIGH (ref 70–99)
Glucose-Capillary: 199 mg/dL — ABNORMAL HIGH (ref 70–99)
Glucose-Capillary: 149 mg/dL — ABNORMAL HIGH (ref 70–99)
Glucose-Capillary: 159 mg/dL — ABNORMAL HIGH (ref 70–99)

## 2018-08-01 LAB — CBC
HCT: 23 % — ABNORMAL LOW (ref 39.0–52.0)
HCT: 22.8 % — ABNORMAL LOW (ref 39.0–52.0)
Hemoglobin: 7.8 g/dL — ABNORMAL LOW (ref 13.0–17.0)
Hemoglobin: 8 g/dL — ABNORMAL LOW (ref 13.0–17.0)
MCH: 30.6 pg (ref 26.0–34.0)
MCH: 31.3 pg (ref 26.0–34.0)
MCHC: 33.9 g/dL (ref 30.0–36.0)
MCHC: 35.1 g/dL (ref 30.0–36.0)
MCV: 90.2 fL (ref 80.0–100.0)
MCV: 89.1 fL (ref 80.0–100.0)
Platelets: 630 10*3/uL — ABNORMAL HIGH (ref 150–400)
Platelets: 646 10*3/uL — ABNORMAL HIGH (ref 150–400)
RBC: 2.55 MIL/uL — ABNORMAL LOW (ref 4.22–5.81)
RBC: 2.56 MIL/uL — ABNORMAL LOW (ref 4.22–5.81)
RDW: 14.2 % (ref 11.5–15.5)
RDW: 14.1 % (ref 11.5–15.5)
WBC: 11.5 10*3/uL — ABNORMAL HIGH (ref 4.0–10.5)
WBC: 11.8 10*3/uL — ABNORMAL HIGH (ref 4.0–10.5)
nRBC: 0 % (ref 0.0–0.2)
nRBC: 0.2 % (ref 0.0–0.2)

## 2018-08-01 LAB — MAGNESIUM: Magnesium: 2.3 mg/dL (ref 1.7–2.4)

## 2018-08-01 LAB — SARS CORONAVIRUS 2 BY RT PCR (HOSPITAL ORDER, PERFORMED IN ~~LOC~~ HOSPITAL LAB): SARS Coronavirus 2: NEGATIVE

## 2018-08-01 LAB — ABO/RH: ABO/RH(D): O POS

## 2018-08-01 LAB — PHOSPHORUS: Phosphorus: 2.9 mg/dL (ref 2.5–4.6)

## 2018-08-01 MED ORDER — INSULIN DETEMIR 100 UNIT/ML ~~LOC~~ SOLN
10.0000 [IU] | Freq: Two times a day (BID) | SUBCUTANEOUS | Status: DC
  Administered 2018-08-01 – 2018-08-02 (×3): 10 [IU] via SUBCUTANEOUS
  Filled 2018-08-01 (×5): qty 0.1

## 2018-08-01 MED ORDER — DEXTROSE 50 % IV SOLN
12.5000 g | Freq: Once | INTRAVENOUS | Status: AC
  Administered 2018-08-01: 12.5 g via INTRAVENOUS

## 2018-08-01 MED ORDER — IPRATROPIUM-ALBUTEROL 0.5-2.5 (3) MG/3ML IN SOLN: 3.0000 mL | Freq: Two times a day (BID) | RESPIRATORY_TRACT | Status: DC | PRN

## 2018-08-01 MED ORDER — DEXTROSE 50 % IV SOLN
25.0000 g | INTRAVENOUS | Status: AC
  Administered 2018-08-01: 12.5 g via INTRAVENOUS
  Filled 2018-08-01: qty 50

## 2018-08-01 MED ORDER — LISINOPRIL 20 MG PO TABS
20.0000 mg | ORAL_TABLET | Freq: Every day | ORAL | Status: DC
  Administered 2018-08-02: 09:00:00 20 mg
  Filled 2018-08-01: qty 1

## 2018-08-01 MED ORDER — AMLODIPINE BESYLATE 10 MG PO TABS: 10.0000 mg | ORAL_TABLET | Freq: Every day | ORAL | Status: DC

## 2018-08-01 MED ORDER — ACETAMINOPHEN 325 MG PO TABS
650.0000 mg | ORAL_TABLET | ORAL | Status: DC | PRN
  Administered 2018-08-01 – 2018-08-02 (×3): 650 mg
  Filled 2018-08-01 (×3): qty 2

## 2018-08-01 MED ORDER — ATORVASTATIN CALCIUM 80 MG PO TABS
80.0000 mg | ORAL_TABLET | Freq: Every day | ORAL | Status: DC
  Administered 2018-08-02: 80 mg
  Filled 2018-08-01: qty 1

## 2018-08-01 MED ORDER — INSULIN ASPART 100 UNIT/ML ~~LOC~~ SOLN
0.0000 [IU] | SUBCUTANEOUS | Status: DC
  Administered 2018-08-01: 3 [IU] via SUBCUTANEOUS
  Administered 2018-08-01: 18:00:00 2 [IU] via SUBCUTANEOUS
  Administered 2018-08-01: 8 [IU] via SUBCUTANEOUS
  Administered 2018-08-02: 2 [IU] via SUBCUTANEOUS
  Administered 2018-08-02: 5 [IU] via SUBCUTANEOUS
  Administered 2018-08-02: 3 [IU] via SUBCUTANEOUS
  Administered 2018-08-02: 8 [IU] via SUBCUTANEOUS

## 2018-08-01 MED ORDER — AMLODIPINE BESYLATE 10 MG PO TABS
10.0000 mg | ORAL_TABLET | Freq: Every day | ORAL | Status: DC
  Administered 2018-08-02: 10 mg
  Filled 2018-08-01: qty 1

## 2018-08-01 MED ORDER — DEXTROSE 50 % IV SOLN
INTRAVENOUS | Status: AC
  Administered 2018-08-01: 12.5 g via INTRAVENOUS
  Filled 2018-08-01: qty 50

## 2018-08-01 MED ORDER — ATORVASTATIN CALCIUM 80 MG PO TABS: 80.0000 mg | ORAL_TABLET | Freq: Every day | ORAL | Status: DC

## 2018-08-01 NOTE — Progress Notes (Addendum)
PROGRESS NOTE  Orris Perin  ZOX:[id number] DOB: [birth year] DOA: 07/22/2018 PCP: [name], NP   Brief Narrative: Corey Li is a 49 y.o. male with a history of T2DM, HTN who underwent recent A/P CDF (C5-7) on 4/2 who presented with altered mental status and hypoxia with some right-sided weakness to the ED & code stroke was called. He appeared septic and had DKA, admitted to ICU, intubated and ultimately extubated on 07/25/2018 & care transferred to [facility] on 07/26/2018. Therapy evaluations have recommended SNF for placement at discharge, PEG placed by IR 4/16. Not appropriate candidate for CIR.  Assessment & Plan: Active Problems:   DKA (diabetic ketoacidoses) (HCC)   Encounter for intubation   Acute respiratory failure with hypoxia (HCC)   Cerebral embolism with cerebral infarction  Bilateral ischemic infarcts: Largest bilateral PCA territory including the left ACA, bilateral punctuate MCA and bilateral ACA infarcts likely embolic from cardioembolic source versus hypercoagulable state with DKA: 2D echo showed an EF of 50% negative bubble study. Lower extremity Doppler was negative. - Continue statin, LDL 72 - DM management as below - Normotension is now BP goal.  - Continue ASA  daily - Neurology was consulted and stroke work-up completed.  Neurology recommended 30-day cardiac event monitoring as outpatient to rule out A. fib.  If negative may consider loop recorder placement.   -Neurology/stroke service signed off 4/12.  Please refer to that day's note for complete details. -Outpatient neurology follow-up with [name] at [facility] in about 4 weeks. -As per CIR input, was not felt to be appropriate candidate for inpatient rehab. -Social work called this morning and advised that patient has a bed available at [facility] but he has to have negative COVID-19 testing for them to take him, test ordered 4/18.  Also patient has had couple episodes of hypoglycemia this morning.  Monitor for additional 24  hours then possible discharge to SNF pending negative COVID-19 results. -He has residual right hemiplegia.  Dysphagia: Very high risk aspiration, complication of CVA:  - PEG placed 4/16. Tube management per IR, tolerating tube feeds which were started on 4/17.  Patient getting frustrated because he is unable to take by mouth.  Has been reassured by nursing.  Acute respiratory failure with hypoxia possibly due to aspiration pneumonia: Patient extubated on 07/25/2018 transferred [facility] on 07/26/2018. He is low probability for COVID-19, however at this time the test is being requested because SNF will not take him without a negative test. A CTA of the head and neck showed bilateral apices groundglass opacity, there was a concern of mucous plugging on 07/23/2018. Chest x-ray mild opacity at the bases. -Patient was on broad-spectrum IV antibiotics from 4/8-4/14 and has completed course. -Recommend repeating chest x-ray in 4 weeks to ensure resolution of pneumonia findings.  History of anterior/posterior C5-C7 decompression/fusion:  - Orthopedics has followed in the hospital. Appreciate recommendations. Incisions look healthy, continue cervical collar.  Sutures removed 4/16.  As per orthopedic follow-up 4/16, Aspen collar ordered and for [place] collar for bathing.  DKA, T2DM:HbA1c 10.9%, weaned from insulin gtt.  -Patient is on bolus feeds but as per RN has not been getting mealtime NovoLog.  Had 2 episodes of hypoglycemia this morning despite reduction of Levemir dose yesterday.  CBGs were in the 50s-60s.  Further reduce Levemir to 10 units every 12 hours, discontinued mealtime NovoLog, changed SSI to moderate sensitivity.  Monitor closely and adjust insulins as needed.  Acute kidney injury: Likely prerenal azotemia resolved with IV  fluid hydration creatinine now at baseline.  Acute metabolic encephalopathy/acute confusional state: - Continue prn haldol.  Improved.  CoNS bacteremia: In 1  out of 2 blood culture collections not consistent with true infection.  Suspect contamination.  Essential hypertension: - Continue amlodipine, lisinopril.  Controlled.  Hypothyroidism:  - Continue synthroid   Hyperlipidemia: - Continue lipitor  Tobacco abuse: Current smoker. - Cessation counseling to be provided, though will not be able to smoke  Hypophosphatemia: -Replaced  Anemia -Hemoglobin dropped from 10.3-7.8 in the absence of overt bleeding.  R/O lab error.  Repeat later this evening and follow-up in a.m. if needed.  DVT prophylaxis: Lovenox Code Status: Full Family Communication: None at bedside Disposition Plan: SNF possibly 4/19  Consultants:   Neurology  Orthopedics  CCM  IR  Procedures:   PEG placement 4/16.  Antimicrobials:  Ceftriaxone > unasyn 4/13-4/14  Subjective: Patient was sleeping and when awakened him was upset that he was woken up.  As per RN, tolerating tube feeds, frustrated that he is unable to take by mouth.  Had 2 episodes of hypoglycemia this morning, 51 mg and 67 mg per DL.  Reportedly not getting mealtime NovoLog.  Objective: Vitals:   08/01/18 0031 08/01/18 0415 08/01/18 0500 08/01/18 0900  BP: (!) 115/57 114/61  139/77  Pulse: 79 77  75  Resp: Temp: 98.5 F (36.9 C) 99 F (37.2 C)  98 F (36.7 C)  TempSrc: Oral Oral  Oral  SpO2: 99% 100%  100%  Weight:   75.7 kg   Height:        Intake/Output Summary (Last 24 hours) at 08/01/2018 1148 Last data filed at 08/01/2018 0805 Gross per 24 hour  Intake 1412.7 ml  Output 1975 ml  Net -562.3 ml   Filed Weights   07/27/18 0500 07/31/18 0500 08/01/18 0500  Weight: 71.9 kg 77.5 kg 75.7 kg   Gen: 48 y.o. male in no distress Pulm: Slightly diminished breath sounds in the bases but no wheezing, rhonchi or crackles.  Rest of lung fields clear to auscultation.  No increased work of breathing. CV: Regular rate and rhythm. No murmur, rub, or gallop. No JVD, no  dependent edema.  Telemetry personally reviewed: Sinus rhythm, telemetry discontinued 4/18. GI: Abdomen soft, PEG site c/d/i, no erythema or discharge, nontender, +BS.   Ext: Warm, no deformities.  Right hemiplegia with grade 0/5 power.  Left limbs grade 5 x 5 power. Skin: No new rashes, lesions or ulcers on visualized skin, except PEG site as above. Neck incision sites appear well healing. Neuro: Sleeping but easily arousable.  Data Reviewed: I have personally reviewed following labs and imaging studies  CBC: Recent Labs  Lab 07/26/18 0549 07/27/18 0511 07/28/18 0431 07/29/18 0342 07/30/18 0521 08/01/18 0629  WBC 16.5* 14.4* 13.6* 11.6* 9.9 11.5*  NEUTROABS 14.1* 11.9* 10.6* 7.6  --   --   HGB 10.0* 10.4* 10.0* 9.3* 10.3* 7.8*  HCT 30.7* 31.2* 29.5* 27.2* 30.1* 23.0*  MCV 91.6 90.4 89.7 90.7 91.2 90.2  PLT 329 405* 506* 590* 747* 630*   Basic Metabolic Panel: Recent Labs  Lab 07/26/18 0549 07/27/18 0511 07/28/18 0431 07/29/18 0342 07/30/18 0521 08/01/18 0629  NA 136  --  135 137 134* 136  K 3.7  --  3.7 3.7 3.6 4.2  CL 97*  --  102 101 96* 99  CO2 27  --  GLUCOSE 114*  --  150* 70 78  72  BUN 12  --  CREATININE 0.76  --  0.85 0.76 0.75 0.65  CALCIUM 9.4  --  8.6* 8.7* 9.1 9.2  MG 2.2 2.2 2.2 2.3  --  2.3  PHOS 2.0* 2.2* 2.6 2.7  --  2.9   GFR: Estimated Creatinine Clearance: 111.7 mL/min (by C-G formula based on SCr of 0.65 mg/dL). Liver Function Tests: Recent Labs  Lab 07/26/18 0549  AST 44*  ALT 25  ALKPHOS 93  BILITOT 0.7  PROT 6.7  ALBUMIN 2.9*   Coagulation Profile: Recent Labs  Lab 07/29/18 1316  INR 1.0   CBG: Recent Labs  Lab 08/01/18 0028 08/01/18 0412 08/01/18 0448 08/01/18 0831 08/01/18 0859  GLUCAP 170* 51* 89 67* 119*   Urine analysis:    Component Value Date/Time   COLORURINE YELLOW 07/22/2018 1234   APPEARANCEUR HAZY (A) 07/22/2018 1234   LABSPEC 1.016 07/22/2018 1234   PHURINE 5.0 07/22/2018 1234     GLUCOSEU >=500 (A) 07/22/2018 1234   HGBUR MODERATE (A) 07/22/2018 1234   BILIRUBINUR NEGATIVE 07/22/2018 1234   KETONESUR 20 (A) 07/22/2018 1234   PROTEINUR 100 (A) 07/22/2018 1234   NITRITE NEGATIVE 07/22/2018 1234   LEUKOCYTESUR NEGATIVE 07/22/2018 1234   Recent Results (from the past 240 hour(s))  Urine culture     Status: None   Collection Time: 07/22/18  1:42 PM  Result Value Ref Range Status   Specimen Description URINE, RANDOM  Final   Special Requests NONE  Final   Culture   Final    NO GROWTH Performed at [facility], [address], [place], [place] [address]    Report Status 07/23/2018 FINAL  Final  Blood culture (routine x 2)     Status: Abnormal   Collection Time: 07/22/18  2:29 PM  Result Value Ref Range Status   Specimen Description BLOOD LEFT ANTECUBITAL  Final   Special Requests   Final    BOTTLES DRAWN AEROBIC AND ANAEROBIC Blood Culture adequate volume   Culture  Setup Time   Final    GRAM POSITIVE COCCI IN BOTH AEROBIC AND ANAEROBIC BOTTLES Organism ID to follow CRITICAL RESULT CALLED TO, READ BACK BY AND VERIFIED WITH: Babs Sciara PharmD 11:50 07/23/18 ([email or web address])    Culture (A)  Final    STAPHYLOCOCCUS SPECIES (COAGULASE NEGATIVE) THE SIGNIFICANCE OF ISOLATING THIS ORGANISM FROM A SINGLE SET OF BLOOD CULTURES WHEN MULTIPLE SETS ARE DRAWN IS UNCERTAIN. PLEASE NOTIFY THE MICROBIOLOGY DEPARTMENT WITHIN ONE WEEK IF SPECIATION AND SENSITIVITIES ARE REQUIRED. Performed at [facility], [address] [place], [place] [address]    Report Status 07/25/2018 FINAL  Final  Blood Culture ID Panel (Reflexed)     Status: Abnormal   Collection Time: 07/22/18  2:29 PM  Result Value Ref Range Status   Enterococcus species NOT DETECTED NOT DETECTED Final   Listeria monocytogenes NOT DETECTED NOT DETECTED Final   Staphylococcus species DETECTED (A) NOT DETECTED Final    Comment: Methicillin (oxacillin) resistant coagulase negative staphylococcus. Possible  blood culture contaminant (unless isolated from more than one blood culture draw or clinical case suggests pathogenicity). No antibiotic treatment is indicated for blood  culture contaminants. CRITICAL RESULT CALLED TO, READ BACK BY AND VERIFIED WITH: Babs Sciara PharmD 11:50 07/23/18 ([email or web address])    Staphylococcus aureus (BCID) NOT DETECTED NOT DETECTED Final   Methicillin resistance DETECTED (A) NOT DETECTED Final    Comment: CRITICAL RESULT CALLED TO, READ BACK BY AND  VERIFIED WITH: Babs Sciara PharmD 11:50 07/23/18 ([email or web address])    Streptococcus species NOT DETECTED NOT DETECTED Final   Streptococcus agalactiae NOT DETECTED NOT DETECTED Final   Streptococcus pneumoniae NOT DETECTED NOT DETECTED Final   Streptococcus pyogenes NOT DETECTED NOT DETECTED Final   Acinetobacter baumannii NOT DETECTED NOT DETECTED Final   Enterobacteriaceae species NOT DETECTED NOT DETECTED Final   Enterobacter cloacae complex NOT DETECTED NOT DETECTED Final   Escherichia coli NOT DETECTED NOT DETECTED Final   Klebsiella oxytoca NOT DETECTED NOT DETECTED Final   Klebsiella pneumoniae NOT DETECTED NOT DETECTED Final   Proteus species NOT DETECTED NOT DETECTED Final   Serratia marcescens NOT DETECTED NOT DETECTED Final   Haemophilus influenzae NOT DETECTED NOT DETECTED Final   Neisseria meningitidis NOT DETECTED NOT DETECTED Final   Pseudomonas aeruginosa NOT DETECTED NOT DETECTED Final   Candida albicans NOT DETECTED NOT DETECTED Final   Candida glabrata NOT DETECTED NOT DETECTED Final   Candida krusei NOT DETECTED NOT DETECTED Final   Candida parapsilosis NOT DETECTED NOT DETECTED Final   Candida tropicalis NOT DETECTED NOT DETECTED Final    Comment: Performed at [facility], [address], [place], [place] [address]  Blood culture (routine x 2)     Status: None   Collection Time: 07/22/18  4:26 PM  Result Value Ref Range Status   Specimen Description BLOOD RIGHT ANTECUBITAL  Final   Special Requests    Final    BOTTLES DRAWN AEROBIC ONLY Blood Culture adequate volume   Culture   Final    NO GROWTH 5 DAYS Performed at [facility], [address], [place], [place] [address]    Report Status 07/27/2018 FINAL  Final  Respiratory Panel by PCR     Status: None   Collection Time: 07/22/18  5:08 PM  Result Value Ref Range Status   Adenovirus NOT DETECTED NOT DETECTED Final   Coronavirus 229E NOT DETECTED NOT DETECTED Final    Comment: (NOTE) The Coronavirus on the Respiratory Panel, DOES NOT test for the novel  Coronavirus (2019 nCoV)    Coronavirus HKU1 NOT DETECTED NOT DETECTED Final   Coronavirus NL63 NOT DETECTED NOT DETECTED Final   Coronavirus OC43 NOT DETECTED NOT DETECTED Final   Metapneumovirus NOT DETECTED NOT DETECTED Final   Rhinovirus / Enterovirus NOT DETECTED NOT DETECTED Final   Influenza A NOT DETECTED NOT DETECTED Final   Influenza B NOT DETECTED NOT DETECTED Final   Parainfluenza Virus 1 NOT DETECTED NOT DETECTED Final   Parainfluenza Virus 2 NOT DETECTED NOT DETECTED Final   Parainfluenza Virus 3 NOT DETECTED NOT DETECTED Final   Parainfluenza Virus 4 NOT DETECTED NOT DETECTED Final   Respiratory Syncytial Virus NOT DETECTED NOT DETECTED Final   Bordetella pertussis NOT DETECTED NOT DETECTED Final   Chlamydophila pneumoniae NOT DETECTED NOT DETECTED Final   Mycoplasma pneumoniae NOT DETECTED NOT DETECTED Final    Comment: Performed at [facility], [address] [place], [place] [address]  MRSA PCR Screening     Status: None   Collection Time: 07/23/18  9:31 AM  Result Value Ref Range Status   MRSA by PCR NEGATIVE NEGATIVE Final    Comment:        The GeneXpert MRSA Assay (FDA approved for NASAL specimens only), is one component of a comprehensive MRSA colonization surveillance program. It is not intended to diagnose MRSA infection nor to guide or monitor treatment for MRSA infections. Performed at [facility], [address]  [place], [place] [address]   Culture, respiratory (non-expectorated)     Status: None   Collection Time: 07/23/18 11:42 AM  Result Value Ref Range Status   Specimen Description TRACHEAL ASPIRATE  Final   Special Requests NONE  Final   Gram Stain   Final    NO WBC SEEN FEW BUDDING YEAST SEEN FEW GRAM POSITIVE RODS FEW GRAM POSITIVE COCCI IN PAIRS    Culture   Final    MODERATE CANDIDA TROPICALIS MODERATE GROUP B STREP(S.AGALACTIAE)ISOLATED TESTING AGAINST S. AGALACTIAE NOT ROUTINELY PERFORMED DUE TO PREDICTABILITY OF AMP/PEN/VAN SUSCEPTIBILITY. Performed at [facility], [address] [place], [place] [address]    Report Status 07/25/2018 FINAL  Final      Radiology Studies: No results found.  Scheduled Meds:  amLODipine  10 mg Oral Daily   aspirin EC  325 mg Oral Daily   Or   aspirin  300 mg Rectal Daily   atorvastatin  80 mg Oral Daily   enoxaparin (LOVENOX) injection  40 mg Subcutaneous Q24H   feeding supplement (GLUCERNA 1.2 CAL)  480 mL Per Tube TID   feeding supplement (PRO-STAT SUGAR FREE 64)  30 mL Per Tube BID   insulin aspart  0-20 Units Subcutaneous Q4H   insulin aspart  4 Units Subcutaneous TID WC   insulin detemir  10 Units Subcutaneous Q12H   levothyroxine  175 mcg Per Tube QAC breakfast   lisinopril  20 mg Oral Daily   mouth rinse  15 mL Mouth Rinse BID   Continuous Infusions:  sodium chloride 10 mL/hr at 07/23/18 0700   sodium chloride 100 mL/hr at 08/01/18 0805     LOS: 10 days   Marcellus ScottAnand Brelan Hannen, MD, FACP, [facility]. [facility]  To contact the attending provider between 7A-7P or the covering provider during after hours 7P-7A, please log into the web site [email or web address] and access using universal [place] password for that web site. If you do not have the password, please call the hospital operator.

## 2018-08-01 NOTE — Progress Notes (Signed)
Hypoglycemic episode at 0412 (51). 12.5g D50 admin @ 0424 BG (89) at 0448. Pt tolerated tx well. Will conti to monitor.  Lincoln Brigham, BSN RN

## 2018-08-01 NOTE — Progress Notes (Signed)
Pt agitated, requesting to drink water, he is NPO but does not understand the reason, gave him haldol as per MD order, refused lab draws, but the lab will be back later to try.

## 2018-08-01 NOTE — Progress Notes (Signed)
Hypoglycemic Event  CBG: 67 @0831   Treatment: 12.5g D50 IV given at 0842 per protocol  Symptoms: asymptomatic  Follow-up CBG: Time:0900 CBG Result:119  Possible Reasons for Event: NPO  Comments/MD notified: Dr. Waymon Amato notified    Cicero Duck T Yosgart Pavey

## 2018-08-01 NOTE — Progress Notes (Signed)
CSW called and spoke with Marylene Land at [facility] about the patient's discharge. They are now requiring a negative COVID screen. That was not disclosed yesterday when CSW spoke with Marylene Land. As long as they have a negative COVID they are able to take him. Insurance has already been approved.   CSW called and spoke with Dr. Waymon Amato and stated that all he needs is a negative COVID screen and then he should be able to discharge. MD will order a COVID screen.   CSW will continue to follow.   Drucilla Schmidt, MSW, LCSW-A Clinical Social Worker [employer]

## 2018-08-02 DIAGNOSIS — I6340 Cerebral infarction due to embolism of unspecified cerebral artery: Secondary | ICD-10-CM

## 2018-08-02 LAB — GLUCOSE, CAPILLARY
Glucose-Capillary: 144 mg/dL — ABNORMAL HIGH (ref 70–99)
Glucose-Capillary: 170 mg/dL — ABNORMAL HIGH (ref 70–99)
Glucose-Capillary: 221 mg/dL — ABNORMAL HIGH (ref 70–99)
Glucose-Capillary: 349 mg/dL — ABNORMAL HIGH (ref 70–99)

## 2018-08-02 MED ORDER — LEVOTHYROXINE SODIUM 175 MCG PO TABS: 175.0000 ug | ORAL_TABLET | Freq: Every day | ORAL | Status: AC

## 2018-08-02 MED ORDER — PRO-STAT SUGAR FREE PO LIQD: 30.0000 mL | Freq: Two times a day (BID) | ORAL | Status: AC

## 2018-08-02 MED ORDER — ATORVASTATIN CALCIUM 80 MG PO TABS: 80.0000 mg | ORAL_TABLET | Freq: Every day | ORAL | Status: AC

## 2018-08-02 MED ORDER — PANTOPRAZOLE SODIUM 40 MG PO PACK: 40.0000 mg | PACK | Freq: Every day | ORAL | Status: AC

## 2018-08-02 MED ORDER — PANTOPRAZOLE SODIUM 40 MG PO PACK
40.0000 mg | PACK | Freq: Every day | ORAL | Status: DC
  Administered 2018-08-02: 40 mg
  Filled 2018-08-02: qty 20

## 2018-08-02 MED ORDER — METFORMIN HCL 1000 MG PO TABS: 1000.0000 mg | ORAL_TABLET | Freq: Two times a day (BID) | ORAL | Status: AC

## 2018-08-02 MED ORDER — POLYETHYLENE GLYCOL 3350 17 G PO PACK: 17.0000 g | PACK | Freq: Every day | ORAL | Status: AC | PRN

## 2018-08-02 MED ORDER — FERROUS SULFATE 300 (60 FE) MG/5ML PO SYRP: 300.0000 mg | ORAL_SOLUTION | Freq: Two times a day (BID) | ORAL | Status: AC

## 2018-08-02 MED ORDER — GLUCERNA 1.2 CAL PO LIQD: 237.0000 mL | Freq: Every day | ORAL | Status: AC

## 2018-08-02 MED ORDER — INSULIN ASPART 100 UNIT/ML ~~LOC~~ SOLN: 0.0000 [IU] | SUBCUTANEOUS | Status: AC

## 2018-08-02 MED ORDER — ASPIRIN 325 MG PO TABS: 325.0000 mg | ORAL_TABLET | Freq: Every day | ORAL | Status: DC

## 2018-08-02 MED ORDER — INSULIN DETEMIR 100 UNIT/ML ~~LOC~~ SOLN: 10.0000 [IU] | Freq: Two times a day (BID) | SUBCUTANEOUS | Status: AC

## 2018-08-02 MED ORDER — ASPIRIN 325 MG PO TABS: 325.0000 mg | ORAL_TABLET | Freq: Every day | ORAL | Status: AC

## 2018-08-02 MED ORDER — AMLODIPINE BESYLATE 10 MG PO TABS: 10.0000 mg | ORAL_TABLET | Freq: Every day | ORAL | Status: AC

## 2018-08-02 MED ORDER — LISINOPRIL 20 MG PO TABS: 20.0000 mg | ORAL_TABLET | Freq: Every day | ORAL | Status: AC

## 2018-08-02 MED ORDER — FERROUS SULFATE 300 (60 FE) MG/5ML PO SYRP
300.0000 mg | ORAL_SOLUTION | Freq: Two times a day (BID) | ORAL | Status: DC
  Filled 2018-08-02 (×2): qty 5

## 2018-08-02 MED ORDER — ACETAMINOPHEN 325 MG PO TABS: 650.0000 mg | ORAL_TABLET | ORAL | Status: AC | PRN

## 2018-08-02 MED ORDER — FREE WATER
200.0000 mL | Freq: Three times a day (TID) | Status: DC
  Administered 2018-08-02: 200 mL

## 2018-08-02 MED ORDER — GLUCERNA 1.2 CAL PO LIQD
237.0000 mL | Freq: Every day | ORAL | Status: DC
  Administered 2018-08-02 (×2): 237 mL
  Filled 2018-08-02 (×6): qty 237

## 2018-08-02 MED ORDER — FREE WATER: 200.0000 mL | Freq: Three times a day (TID) | Status: AC

## 2018-08-02 NOTE — Progress Notes (Signed)
Assisted tele visit to patient with friend Angie.  Dynesha Woolen, Loni Beckwith, RN

## 2018-08-02 NOTE — Progress Notes (Signed)
Report called to RN Nisha at [facility]. IV discontinued without complication. Pt spoke to friend Angie via phone before leaving with [facility].

## 2018-08-02 NOTE — Discharge Summary (Signed)
Physician Discharge Summary  Corey Li WUJ:[id number] DOB: 27-Jan-1971  PCP: [name], NP  Admit date: 07/22/2018 Discharge date: 08/02/2018  Recommendations for Outpatient Follow-up:  1. MD at SNF in 2 to 3 days with repeat labs (CBC & BMP). 2. MD at SNF to arrange 30-day cardiac event monitoring as outpatient to rule out A. Fib. I have sent an inbasket message to [name] to arrange this as well. 3. [name], Neurology in 4 weeks.  Ambulatory Neurology referral sent. 4. [name], Orthopedic surgery for postop follow-up. 5. [name], NP/PCP upon discharge from SNF. 6. Recommend SLP consultation and follow-up at SNF regarding dysphagia evaluation and management. 7. As per Orthopedics: Aspen collar and for [place] collar for bathing. 8. Recommend repeating chest x-ray in 4 weeks to ensure resolution of pneumonia findings.  Home Health: Patient being discharged to SNF. Equipment/Devices: TBD at SNF.  Discharge Condition: Improved and stable. CODE STATUS: Full. Diet recommendation: NPO.  Patient is on tube feeds.  All medications to be given via PEG tube.  Strict aspiration precautions.  Discharge Diagnoses:  Active Problems:   DKA (diabetic ketoacidoses) (HCC)   Encounter for intubation   Acute respiratory failure with hypoxia (HCC)   Cerebral embolism with cerebral infarction   Brief Summary: Corey Billingsis a 48 y.o.malewith a history of T2DM with peripheral neuropathy, HTN, HLD, hypothyroid, GERD, sleep apnea, S/p A/ P CDF C5-C7, on 4/1/ 4/2 by [name] for radiculopathy, who presented from a hotel room with altered mental status and hypoxia with some right-sided weakness to the ED & code stroke was called. He appeared septic and had DKA, admitted to ICU, intubated and ultimately extubated on 07/25/2018 & care transferred to [name] on 07/26/2018.   Neurology consulted and patient completed stroke evaluation.  IR consulted and placed PEG  tube.  Orthopedics continue to follow postoperative course in the hospital.  Assessment & Plan:  Bilateral ischemic infarcts: Largest bilateral PCA territory including the left ACA, bilateral punctuate MCA and bilateral ACA infarcts likely embolic from cardioembolic source versus hypercoagulable state with DKA: 2D echo showed an EF of 50% negative bubble study. Lower extremity Doppler was negative. - Continue statin, LDL 72 - DM management as below - Normotension is now BP goal.  - Continue ASA 325mg  daily - Neurology was consulted and stroke work-up completed.  Neurology recommended 30-day cardiac event monitoring as outpatient to rule out A. fib.  If negative may consider loop recorder placement.   -Neurology/stroke service signed off 4/12.  Please refer to that day's note for complete details. -Outpatient neurology follow-up with [name] at [name] in about 4 weeks. -Patient has residual right hemiplegia and dysphagia. -Stable for DC to SNF for rehab.  COVID 19 tested 7/18 as per SNF request and results negative.  Dysphagia: Very high risk aspiration, complication of CVA:  - PEG placed 4/16. Tube management per IR, tolerating tube feeds which were started on 4/17.  -On day of discharge, patient complained that tube feed volumes were large causing abdominal discomfort.  Dietitian evaluated and reduced volume of tube feeds and increased frequency.  Patient also complains of GERD symptoms, Protonix initiated. -SLP follow-up at SNF to continue evaluation of dysphagia and hopeful for recovery.  Acute respiratory failure with hypoxia possibly due to aspiration pneumonia:  - Patient extubated on 07/25/2018 transferred to [facility] on 07/26/2018. He is low probability for COVID-19, however test was requested by SNF who would not take him without  a negative test. A CTA of the head and neck showed bilateral apices groundglass opacity, there was a concern of mucous plugging on 07/23/2018. Chest  x-ray mild opacity at the bases. -Patient was on broad-spectrum IV antibiotics from 4/8-4/14 and has completed course. -Recommend repeating chest x-ray in 4 weeks to ensure resolution of pneumonia findings.  S/p A/ P CDF C5-C7, on 4/1/ 4/2:  - Orthopedics has followed in the hospital. Appreciate recommendations. Incisions look healthy, continue cervical collar.  Sutures removed 4/16.  As per orthopedic follow-up 4/16, Aspen collar ordered and for [place] collar for bathing.  DKA, T2DM: - HbA1c 10.9%, weaned from insulin gtt. Suggest poor outpatient control. -On 4/18, patient had mild hypoglycemic episodes in the morning and insulins were adjusted.  No further hypoglycemic episodes.  Glycemic control better today.  Close monitoring at SNF and adjust insulins as needed.   -Currently on Levemir 10 units twice daily and NovoLog moderate sensitivity sliding scale.  Acute kidney injury:  Likely prerenal azotemia resolved with IV fluid hydration creatinine now at baseline.  Acute metabolic encephalopathy/acute confusional state: - Seems to have resolved.  CoNS bacteremia:  In 1 out of 2 blood culture collections not consistent with true infection.  Suspect contamination.  Essential hypertension: - Continue amlodipine, lisinopril.  Controlled.  Hypothyroidism:  - Continue synthroid   Hyperlipidemia: - Continue lipitor  Tobacco abuse:  - Current smoker. -Cessation counseled.  Hypophosphatemia: -Replaced  Anemia -Hemoglobin dropped from 10.3-7.8 yesterday in the absence of overt bleeding.   -Repeated hemoglobin and stable at 8 g per DL.  Resume ferrous sulfate supplements that he was on prior to admission.  Follow CBCs closely at SNF.  GERD -PPI resumed.  Sleep apnea Outpatient follow-up.    Consultants:   Neurology  Orthopedics  CCM  IR  Procedures:   PEG placement 4/16.   Discharge Instructions  Discharge Instructions    Ambulatory referral  to Neurology   Complete by:  As directed    Follow up with [name] at [name] in 4-6 weeks. Too complicated for RN to follow. Thanks.   Call MD for:   Complete by:  As directed    Recurrent strokelike symptoms.   Call MD for:  difficulty breathing, headache or visual disturbances   Complete by:  As directed    Call MD for:  extreme fatigue   Complete by:  As directed    Call MD for:  persistant dizziness or light-headedness   Complete by:  As directed    Call MD for:  persistant nausea and vomiting   Complete by:  As directed    Call MD for:  redness, tenderness, or signs of infection (pain, swelling, redness, odor or green/yellow discharge around incision site)   Complete by:  As directed    Call MD for:  severe uncontrolled pain   Complete by:  As directed    Call MD for:  temperature >100.4   Complete by:  As directed    Discharge instructions   Complete by:  As directed    Patient is strictly NPO.  He is on tube feeds and all medications to be given via PEG tube.  Please follow strict aspiration precautions.   Increase activity slowly   Complete by:  As directed        Medication List    STOP taking these medications   diazepam 5 MG tablet Commonly known as:  VALIUM   ferrous sulfate 325 (65 FE) MG tablet Replaced by:  ferrous sulfate 300 (60 Fe) MG/5ML syrup   furosemide 40 MG tablet Commonly known as:  LASIX   gabapentin 300 MG capsule Commonly known as:  NEURONTIN   insulin NPH Human 100 UNIT/ML injection Commonly known as:  NOVOLIN N   insulin regular 100 units/mL injection Commonly known as:  NOVOLIN R   mupirocin ointment 2 % Commonly known as:  BACTROBAN   omeprazole 20 MG capsule Commonly known as:  PRILOSEC   oxyCODONE-acetaminophen 5-325 MG tablet Commonly known as:  PERCOCET/ROXICET   PROzac 10 MG capsule Generic drug:  FLUoxetine     TAKE these medications   acetaminophen 325 MG tablet Commonly known as:  TYLENOL Place 2 tablets (650 mg  total) into feeding tube every 4 (four) hours as needed for mild pain, moderate pain or fever (temp > 101.5).   amLODipine 10 MG tablet Commonly known as:  NORVASC Place 1 tablet (10 mg total) into feeding tube daily. Start taking on:  August 03, 2018 What changed:    medication strength  how much to take  how to take this   aspirin 325 MG tablet Place 1 tablet (325 mg total) into feeding tube daily. Start taking on:  August 03, 2018   atorvastatin 80 MG tablet Commonly known as:  LIPITOR Place 1 tablet (80 mg total) into feeding tube daily. Start taking on:  August 03, 2018 What changed:  how to take this   feeding supplement (GLUCERNA 1.2 CAL) Liqd Place 237 mLs into feeding tube 6 (six) times daily.   feeding supplement (PRO-STAT SUGAR FREE 64) Liqd Place 30 mLs into feeding tube 2 (two) times daily.   ferrous sulfate 300 (60 Fe) MG/5ML syrup Place 5 mLs (300 mg total) into feeding tube 2 (two) times daily with a meal. Replaces:  ferrous sulfate 325 (65 FE) MG tablet   free water Soln Place 200 mLs into feeding tube 3 (three) times daily between meals. 200 mL free water flush given TID between tube feed boluses.   insulin aspart 100 UNIT/ML injection Commonly known as:  novoLOG Inject 0-15 Units into the skin every 4 (four) hours. CBG < 70: implement hypoglycemia protocol CBG 70 - 120: 0 units CBG 121 - 150: 2 units CBG 151 - 200: 3 units CBG 201 - 250: 5 units CBG 251 - 300: 8 units CBG 301 - 350: 11 units CBG 351 - 400: 15 units CBG > 400: call MD.   insulin detemir 100 UNIT/ML injection Commonly known as:  LEVEMIR Inject 0.1 mLs (10 Units total) into the skin every 12 (twelve) hours.   levothyroxine 175 MCG tablet Commonly known as:  SYNTHROID Place 1 tablet (175 mcg total) into feeding tube daily before breakfast. Start taking on:  August 03, 2018 What changed:  how to take this   lisinopril 20 MG tablet Commonly known as:  ZESTRIL Place 1 tablet (20 mg  total) into feeding tube daily. Start taking on:  August 03, 2018 What changed:    medication strength  how much to take  how to take this   metFORMIN 1000 MG tablet Commonly known as:  GLUCOPHAGE Place 1 tablet (1,000 mg total) into feeding tube 2 (two) times daily with a meal. What changed:  how to take this   pantoprazole sodium 40 mg/20 mL Pack Commonly known as:  PROTONIX Place 20 mLs (40 mg total) into feeding tube daily.   polyethylene glycol 17 g packet Commonly known as:  MIRALAX / GLYCOLAX Place  17 g into feeding tube daily as needed for mild constipation or moderate constipation. What changed:    how to take this  reasons to take this      Follow-up Information    Micki Riley, MD. Schedule an appointment as soon as possible for a visit in 4 week(s).   Specialties:  Neurology, Radiology Contact information: [address] [place] [address] [phone]        Estill Bamberg, MD. Schedule an appointment as soon as possible for a visit in 1 week(s).   Specialty:  Orthopedic Surgery Why:  Post op follow up. Contact information: [address] [place] [address] [phone]        MD at SNF. Schedule an appointment as soon as possible for a visit.   Why:  To be seen in 2 to 3 days with repeat labs (CBC & BMP).       Placey, Chales Abrahams, NP. Schedule an appointment as soon as possible for a visit.   Why:  Upon discharge from SNF. Contact information: [address] [place] [address] [phone]          No Known Allergies    Procedures/Studies: Ct Abdomen Wo Contrast  Result Date: 07/29/2018 CLINICAL DATA:  Abnormal swallowing study with severe oro pharyngeal dysphagia. Assessment abdominal anatomy for possible gastrostomy tube placement. EXAM: CT ABDOMEN WITHOUT CONTRAST TECHNIQUE: Multidetector CT imaging of the abdomen was performed following the standard protocol without IV contrast.  COMPARISON:  None. FINDINGS: Lower chest: Mild bibasilar atelectasis. No pleural effusions. No hiatal hernia identified. Hepatobiliary: No focal liver abnormality is seen. No gallstones, gallbladder wall thickening, or biliary dilatation. Pancreas: Unremarkable. No pancreatic ductal dilatation or surrounding inflammatory changes. Spleen: Normal in size without focal abnormality. Adrenals/Urinary Tract: Adrenal glands are unremarkable. Kidneys are normal, without renal calculi, focal lesion, or hydronephrosis. Stomach/Bowel: Indwelling feeding tube extends through the stomach and terminates in the region of the duodenal bulb. Gastric anatomy appears normal with the stomach lying superior to the transverse colon. No evidence of bowel obstruction or significant ileus. No free air identified. No incidental masses. Tiny benign-appearing mesenteric calcification in the anterior peritoneal cavity just below the umbilicus. Vascular/Lymphatic: Mild calcified plaque in a normal caliber abdominal aorta. No lymphadenopathy visualized. Other: No abdominal wall hernias, ascites or abnormal fluid collections identified. No evidence of anasarca or body wall edema. Musculoskeletal: No acute or significant osseous findings. IMPRESSION: Normal bowel anatomy with no contraindication to potential future percutaneous gastrostomy tube placement. No acute findings in the abdomen. The indwelling feeding tube extends to the region of the duodenal bulb. Electronically Signed   By: Irish Lack M.D.   On: 07/29/2018 07:58   Ct Angio Head W Or Wo Contrast  Result Date: 07/22/2018 CLINICAL DATA:  Altered mental status. Right-sided weakness. Recent cervical spine surgery. EXAM: CT ANGIOGRAPHY HEAD AND NECK TECHNIQUE: Multidetector CT imaging of the head and neck was performed using the standard protocol during bolus administration of intravenous contrast. Multiplanar CT image reconstructions and MIPs were obtained to evaluate the vascular  anatomy. Carotid stenosis measurements (when applicable) are obtained utilizing NASCET criteria, using the distal internal carotid diameter as the denominator. CONTRAST:  OMNIPAQUE IOHEXOL 350 MG/ML SOLN COMPARISON:  Head and cervical spine CTs 07/22/2018 FINDINGS: CTA NECK FINDINGS Aortic arch: Normal variant aortic arch branching pattern with common origin of the brachiocephalic and left common carotid arteries. Mild arch atherosclerosis. Widely patent arch vessel origins. Right carotid  system: Patent without evidence of stenosis or dissection. Left carotid system: Patent without evidence of stenosis or dissection. Vertebral arteries: Patent without evidence of stenosis or dissection. Minimal calcified plaque at the right vertebral artery origin. Minimally dominant left vertebral artery. Skeleton: Postoperative changes from recent C5-C7 anterior and posterior fusion with C6 corpectomy as described on earlier cervical spine CT. Scattered postoperative stranding and gas in the soft tissues of the neck including mild prevertebral edema and/or fluid as well as ventral epidural gas at C5-6 and gas in the posterior operative bed from C5-C7. Other neck: Endotracheal and enteric tubes present. Upper chest: Patchy peripheral ground-glass opacities in the included portions of both upper lobes, some of which are rounded. Review of the MIP images confirms the above findings CTA HEAD FINDINGS Anterior circulation: The internal carotid arteries are widely patent from skull base to carotid termini. ACAs and MCAs are patent without evidence of proximal branch occlusion or significant A1 or M1 stenosis. There is diffuse branch vessel irregular narrowing and distal attenuation. No aneurysm is identified. Posterior circulation: The intracranial vertebral arteries are widely patent to the basilar. The basilar artery is widely patent a left PICA, right AICA, and bilateral SCAs are visualized but not well evaluated due to their  small size. Posterior communicating arteries are not identified and may be diminutive or absent. The PCAs are patent with diffuse irregularity including multiple apparent severe P2 stenoses bilaterally. No aneurysm is identified. Venous sinuses: Patent. Anatomic variants: None. Delayed phase: Not performed. Review of the MIP images confirms the above findings IMPRESSION: 1. No emergent large vessel occlusion. 2. Widely patent cervical carotid and vertebral arteries. 3. Diffuse irregular narrowing of the small to medium-sized intracranial arteries. While this could be partly artifactual, a diffuse non-atherosclerotic process such as vasculitis is a concern. 4. Patchy ground-glass opacities in both lung apices, nonspecific. Atypical infection (including viral pneumonia) and noninfectious etiologies are possible. 5. Postoperative changes from recent cervical spine fusion as previously described. Postoperative infection is not excluded. These results were called by telephone at the time of interpretation on 07/22/2018 at 4:59 pm to Dr. Craige Cotta, who verbally acknowledged these results. Electronically Signed   By: Sebastian Ache M.D.   On: 07/22/2018 17:01    Dg Abd 1 View  Result Date: 07/24/2018 CLINICAL DATA:  OG tube placement. EXAM: ABDOMEN - 1 VIEW COMPARISON:  Radiograph yesterday. FINDINGS: Tip and side port of the enteric tube below the diaphragm in the stomach. Nonobstructive bowel gas pattern in the upper abdomen. IMPRESSION: Tip and side port of the enteric tube below the diaphragm in the stomach. Electronically Signed   By: Narda Rutherford M.D.   On: 07/24/2018 02:32   Dg Abd 1 View  Result Date: 07/23/2018 CLINICAL DATA:  NG tube placement. EXAM: ABDOMEN - 1 VIEW COMPARISON:  Radiograph yesterday at 1329 hour FINDINGS: The tip of the enteric tube is now below the diaphragm in the stomach, the side port remains in the distal esophagus. Recommend advancement of 5-10 cm for optimal placement. Nonobstructed  bowel-gas pattern in the included upper abdomen. IMPRESSION: Tip of the enteric tube below the diaphragm in the stomach, side port remains in the distal esophagus. Recommend advancement of 5-10 cm for optimal placement. Electronically Signed   By: Narda Rutherford M.D.   On: 07/23/2018 02:36   Ct Angio Neck W Or Wo Contrast  Result Date: 07/22/2018 CLINICAL DATA:  Altered mental status. Right-sided weakness. Recent cervical spine surgery. EXAM: CT ANGIOGRAPHY HEAD AND NECK  TECHNIQUE: Multidetector CT imaging of the head and neck was performed using the standard protocol during bolus administration of intravenous contrast. Multiplanar CT image reconstructions and MIPs were obtained to evaluate the vascular anatomy. Carotid stenosis measurements (when applicable) are obtained utilizing NASCET criteria, using the distal internal carotid diameter as the denominator. CONTRAST:  OMNIPAQUE IOHEXOL 350 MG/ML SOLN COMPARISON:  Head and cervical spine CTs 07/22/2018 FINDINGS: CTA NECK FINDINGS Aortic arch: Normal variant aortic arch branching pattern with common origin of the brachiocephalic and left common carotid arteries. Mild arch atherosclerosis. Widely patent arch vessel origins. Right carotid system: Patent without evidence of stenosis or dissection. Left carotid system: Patent without evidence of stenosis or dissection. Vertebral arteries: Patent without evidence of stenosis or dissection. Minimal calcified plaque at the right vertebral artery origin. Minimally dominant left vertebral artery. Skeleton: Postoperative changes from recent C5-C7 anterior and posterior fusion with C6 corpectomy as described on earlier cervical spine CT. Scattered postoperative stranding and gas in the soft tissues of the neck including mild prevertebral edema and/or fluid as well as ventral epidural gas at C5-6 and gas in the posterior operative bed from C5-C7. Other neck: Endotracheal and enteric tubes present. Upper chest:  Patchy peripheral ground-glass opacities in the included portions of both upper lobes, some of which are rounded. Review of the MIP images confirms the above findings CTA HEAD FINDINGS Anterior circulation: The internal carotid arteries are widely patent from skull base to carotid termini. ACAs and MCAs are patent without evidence of proximal branch occlusion or significant A1 or M1 stenosis. There is diffuse branch vessel irregular narrowing and distal attenuation. No aneurysm is identified. Posterior circulation: The intracranial vertebral arteries are widely patent to the basilar. The basilar artery is widely patent a left PICA, right AICA, and bilateral SCAs are visualized but not well evaluated due to their small size. Posterior communicating arteries are not identified and may be diminutive or absent. The PCAs are patent with diffuse irregularity including multiple apparent severe P2 stenoses bilaterally. No aneurysm is identified. Venous sinuses: Patent. Anatomic variants: None. Delayed phase: Not performed. Review of the MIP images confirms the above findings IMPRESSION: 1. No emergent large vessel occlusion. 2. Widely patent cervical carotid and vertebral arteries. 3. Diffuse irregular narrowing of the small to medium-sized intracranial arteries. While this could be partly artifactual, a diffuse non-atherosclerotic process such as vasculitis is a concern. 4. Patchy ground-glass opacities in both lung apices, nonspecific. Atypical infection (including viral pneumonia) and noninfectious etiologies are possible. 5. Postoperative changes from recent cervical spine fusion as previously described. Postoperative infection is not excluded. These results were called by telephone at the time of interpretation on 07/22/2018 at 4:59 pm to Dr. Craige Cotta, who verbally acknowledged these results. Electronically Signed   By: Sebastian Ache M.D.   On: 07/22/2018 17:01   Ct Cervical Spine Wo Contrast  Result Date:  07/22/2018 CLINICAL DATA:  Recent neck surgery 07/16/2018. Neck pain with radiculopathy. EXAM: CT CERVICAL SPINE WITHOUT CONTRAST TECHNIQUE: Multidetector CT imaging of the cervical spine was performed without intravenous contrast. Multiplanar CT image reconstructions were also generated. COMPARISON:  CT cervical spine 07/17/2018, MRI 06/29/2018 FINDINGS: Alignment: Normal Image quality degraded by moderate motion. Skull base and vertebrae: Negative for fracture Soft tissues and spinal canal: Prevertebral soft tissue swelling is unchanged most prominent at C4 through C6 and measuring approximately 12 mm in thickness. This is likely edema and fluid related to recent anterior cervical fusion. Improvement in soft tissue gas in the  left neck and prevertebral soft tissues related to recent anterior fusion. Spinal canal not well evaluated due to streak artifact and patient motion Disc levels: C6 corpectomy with strut graft. Anterior plate fusion extending from C5 through C7. Posterior screw and rod fusion C5 through C7 bilaterally. Hardware in satisfactory position and unchanged from the prior CT. Upper chest: Lung apices clear bilaterally. Other: None IMPRESSION: Image quality degraded by moderate motion Corpectomy at C6. Anterior and posterior fusion from C5 through C7. Hardware in good position and unchanged from recent study. Postop prevertebral soft tissue swelling related to recent surgery and unchanged from the prior study. Electronically Signed   By: Marlan Palau M.D.   On: 07/22/2018 13:21    Mr Maxine Glenn Head Wo Contrast  Result Date: 07/23/2018 CLINICAL DATA:  Stroke follow-up.  Right-sided paralysis. EXAM: MRI HEAD WITHOUT AND WITH CONTRAST MRA HEAD WITHOUT CONTRAST TECHNIQUE: Multiplanar, multiecho pulse sequences of the brain and surrounding structures were obtained without and with intravenous contrast. Angiographic images of the head were obtained using MRA technique without contrast. CONTRAST:  8 mL  [facility] COMPARISON:  Brain MRI 06/29/2018 CTA head neck 07/22/2018 FINDINGS: MRI HEAD FINDINGS BRAIN: Multifocal bilateral acute ischemia scattered throughout both hemispheres and multiple vascular territories, including the right cerebellum. The largest areas of ischemia are in the posterior cerebral artery territories. 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 There is moderate edema within the affected areas. There is mild leptomeningeal contrast enhancement within both PCA territories and within the left basal ganglia. The midline structures are normal. The white matter signal is normal for the patient's age. The CSF spaces are normal for age, with no hydrocephalus. Susceptibility-sensitive sequences show no chronic microhemorrhage or superficial siderosis. SKULL AND UPPER CERVICAL SPINE: The visualized skull base, calvarium, upper cervical spine and extracranial soft tissues are normal. SINUSES/ORBITS: No fluid levels or advanced mucosal thickening. No mastoid or middle ear effusion. The orbits are normal. MRA HEAD FINDINGS POSTERIOR CIRCULATION: --Basilar artery: Normal. --Posterior cerebral arteries: Patent proximally. Multifocal moderate-to-severe stenosis of the distal P2 segments. --Superior cerebellar arteries: Normal. --Inferior cerebellar arteries: Normal anterior and posterior inferior cerebellar arteries. ANTERIOR CIRCULATION: --Intracranial internal carotid arteries: Normal. --Anterior cerebral arteries: Normal. Both A1 segments are present. Patent anterior communicating artery. --Middle cerebral arteries: Normal. --Posterior communicating arteries: Absent or diminutive IMPRESSION: 1. Multifocal acute ischemia throughout multiple vascular territories in a pattern most consistent with emboli from a central cardiac or aortic source. The largest areas of ischemia are within the  posterior cerebral artery territories. 2. No acute hemorrhage, herniation or hydrocephalus. 3. 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 4. No proximal intracranial arterial occlusion. Electronically Signed   By: Deatra Robinson M.D.   On: 07/23/2018 16:08   Mr Laqueta Jean ZO Contrast  Result Date: 07/23/2018 CLINICAL DATA:  Stroke follow-up.  Right-sided paralysis. EXAM: MRI HEAD WITHOUT AND WITH CONTRAST MRA HEAD WITHOUT CONTRAST TECHNIQUE: Multiplanar, multiecho pulse sequences of the brain and surrounding structures were obtained without and with intravenous contrast. Angiographic images of the head were obtained using MRA technique without contrast. CONTRAST:  8 mL Gadavist COMPARISON:  Brain MRI 06/29/2018 CTA head neck 07/22/2018 FINDINGS: MRI HEAD FINDINGS BRAIN: Multifocal bilateral acute ischemia scattered throughout both hemispheres and multiple vascular territories, including the right cerebellum. The largest areas of ischemia are in the posterior cerebral artery territories. Infarct along the dorsal aspect  of the left basal ganglia, in close proximity to the posterior limb of the internal capsule could account for the right-sided weakness. There is moderate edema within the affected areas. There is mild leptomeningeal contrast enhancement within both PCA territories and within the left basal ganglia. The midline structures are normal. The white matter signal is normal for the patient's age. The CSF spaces are normal for age, with no hydrocephalus. Susceptibility-sensitive sequences show no chronic microhemorrhage or superficial siderosis. SKULL AND UPPER CERVICAL SPINE: The visualized skull base, calvarium, upper cervical spine and extracranial soft tissues are normal. SINUSES/ORBITS: No fluid levels or advanced mucosal thickening. No mastoid or middle ear effusion. The orbits are normal. MRA HEAD FINDINGS POSTERIOR CIRCULATION: --Basilar artery: Normal. --Posterior cerebral arteries:  Patent proximally. Multifocal moderate-to-severe stenosis of the distal P2 segments. --Superior cerebellar arteries: Normal. --Inferior cerebellar arteries: Normal anterior and posterior inferior cerebellar arteries. ANTERIOR CIRCULATION: --Intracranial internal carotid arteries: Normal. --Anterior cerebral arteries: Normal. Both A1 segments are present. Patent anterior communicating artery. --Middle cerebral arteries: Normal. --Posterior communicating arteries: Absent or diminutive IMPRESSION: 1. Multifocal acute ischemia throughout multiple vascular territories in a pattern most consistent with emboli from a central cardiac or aortic source. The largest areas of ischemia are within the posterior cerebral artery territories. 2. No acute hemorrhage, herniation or hydrocephalus. 3. 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 4. No proximal intracranial arterial occlusion. Electronically Signed   By: Deatra Robinson M.D.   On: 07/23/2018 16:08   Ir Gastrostomy Tube Mod Sed  Result Date: 07/30/2018 INDICATION: 48 year old with CVA and dysphagia. Scheduled for percutaneous gastrostomy tube. EXAM: PERCUTANEOUS GASTROSTOMY TUBE WITH FLUOROSCOPIC GUIDANCE Physician: Rachelle Hora. Lowella Dandy, MD MEDICATIONS: Ancef 2 g; Antibiotics were administered within 1 hour of the procedure. Glucagon 1 mg IV ANESTHESIA/SEDATION: Versed 0.5 mg IV; Fentanyl 25 mcg IV Moderate Sedation Time:  17 minutes The patient was continuously monitored during the procedure by the interventional radiology nurse under my direct supervision. FLUOROSCOPY TIME:  Fluoroscopy Time: 4 minutes 36 seconds (25 mGy). COMPLICATIONS: None immediate. PROCEDURE: Informed consent was obtained for a percutaneous gastrostomy tube. The patient was placed on the interventional table. An orogastric tube was placed with fluoroscopic guidance. The anterior abdomen was prepped and draped in sterile fashion. Maximal barrier sterile technique was utilized including  caps, mask, sterile gowns, sterile gloves, sterile drape, hand hygiene and skin antiseptic. Stomach was inflated with air through the orogastric tube. The skin and subcutaneous tissues were anesthetized with 1% lidocaine. A 17 gauge needle was directed into the distended stomach with fluoroscopic guidance. A wire was advanced into the stomach and a T-tact was deployed. A 9-French vascular sheath was placed and the orogastric tube was snared using a Gooseneck snare device. The orogastric tube and snare were pulled out of the patient's mouth. The snare device was connected to a 20-French gastrostomy tube. The snare device and gastrostomy tube were pulled through the patient's mouth and out the anterior abdominal wall. The gastrostomy tube was cut to an appropriate length. Contrast injection through gastrostomy tube confirmed placement within the stomach. Fluoroscopic images were obtained for documentation. The gastrostomy tube was flushed with normal saline. IMPRESSION: Successful fluoroscopic guided percutaneous gastrostomy tube placement. Electronically Signed   By: Richarda Overlie M.D.   On: 07/30/2018 13:46   Dg Chest Port 1 View  Result Date: 07/26/2018 CLINICAL DATA:  Extubated EXAM: PORTABLE CHEST 1 VIEW COMPARISON:  Chest radiograph from one day prior. FINDINGS: Interval extubation and removal of enteric tube. Partially  visualized surgical hardware overlying the lower cervical spine. Stable cardiomediastinal silhouette with normal heart size. No pneumothorax. No pleural effusion. No pulmonary edema. Mild hazy and reticular opacities at the lung bases, left greater than right, not appreciably changed. IMPRESSION: Interval extubation and removal of enteric tube. Stable mild hazy and reticular opacities at the lung bases, left greater than right. Electronically Signed   By: Delbert Phenix M.D.   On: 07/26/2018 08:34   Dg Chest Port 1 View  Result Date: 07/25/2018 CLINICAL DATA:  Ventilator dependent respiratory  failure. Follow-up LEFT LOWER LOBE atelectasis and/or pneumonia. EXAM: PORTABLE CHEST 1 VIEW COMPARISON:  07/24/2018 and earlier. FINDINGS: Endotracheal tube tip in satisfactory position approximately 3 cm above carina. Gastric tube courses below the diaphragm into the stomach. Heart size normal for AP portable technique. Mild airspace consolidation in the LEFT LOWER LOBE, unchanged. New linear atelectasis in the LEFT UPPER LOBE and the BILATERAL lung bases. No confluent airspace consolidation elsewhere in either lung. IMPRESSION: 1. Support apparatus satisfactory. 2. Stable mild LEFT LOWER LOBE pneumonia. 3. New linear atelectasis in the LEFT UPPER LOBE and BILATERAL lung bases. Electronically Signed   By: Hulan Saas M.D.   On: 07/25/2018 07:54   Dg Chest Port 1 View  Result Date: 07/24/2018 CLINICAL DATA:  Intubation. EXAM: PORTABLE CHEST 1 VIEW COMPARISON:  Radiograph yesterday. FINDINGS: Endotracheal tube tip at the thoracic inlet 3.2 cm from the carina. Progressive volume loss in the left hemithorax with increasing basilar and mid lung patchy opacity. Progressive vague opacity in the left upper lung zone, suggesting partial lobar collapse. Right lung base atelectasis. Heart is normal in size. IMPRESSION: 1. Endotracheal tube tip at the thoracic inlet 3.2 cm from the carina. 2. Progressive volume loss in the left hemithorax. New vague upper lung zone opacity suggesting partial lobar collapse. Increasing patchy opacities throughout the mid and lower lung zone may be atelectasis, aspiration, or infection. Electronically Signed   By: Narda Rutherford M.D.   On: 07/24/2018 02:31   Dg Chest Port 1 View  Result Date: 07/23/2018 CLINICAL DATA:  Respiratory failure. EXAM: PORTABLE CHEST 1 VIEW COMPARISON:  Radiograph yesterday, lung apices from neck CTA yesterday. FINDINGS: Endotracheal tube tip remains just at the thoracic inlet. Enteric tube in place with tip below the diaphragm, side-port in the distal  esophagus. Normal heart size and mediastinal contours. Mild bibasilar atelectasis. No pulmonary edema, pneumothorax, or pleural effusion. Surgical hardware in the cervical spine is partially included. IMPRESSION: 1. Endotracheal tube tip at the thoracic inlet. Enteric tube in place with tip below the diaphragm, side-port in the distal esophagus. Recommend advancement of cm for optimal placement. 2. Mild bibasilar atelectasis. Ground-glass opacities on neck CTA are not well demonstrated radiographically. Electronically Signed   By: Narda Rutherford M.D.   On: 07/23/2018 02:40   Dg Chest Port 1 View  Result Date: 07/22/2018 CLINICAL DATA:  Hypoxia and altered mental status EXAM: PORTABLE CHEST 1 VIEW COMPARISON:  None. FINDINGS: Endotracheal tube tip is 5.7 cm above the carina. Nasogastric tube tip is at the gastroesophageal junction. No pneumothorax. There is atelectatic change in the lung bases as well as in the right mid lung. There is no frank edema or consolidation. Heart size and pulmonary vascularity are normal. No adenopathy. No bone lesions. IMPRESSION: Tube positions as described without pneumothorax. Note that nasogastric tube tip is at the gastroesophageal junction. Advise advancing nasogastric tube 8-10 cm. Areas of atelectatic change in the right mid lung and bilateral base  regions. No consolidation. Heart size within normal limits. Electronically Signed   By: Bretta Bang III M.D.   On: 07/22/2018 13:59   Dg Abd Portable 1v  Result Date: 07/23/2018 CLINICAL DATA:  Gastric tube placement EXAM: PORTABLE ABDOMEN - 1 VIEW COMPARISON:  07/23/2018 FINDINGS: Gastric tube has been advanced and is now coiled within the body of the stomach. Normal bowel gas pattern. Moderate stool in the colon. No acute skeletal abnormality. IMPRESSION: NG tube has been advanced and is now coiled in the body the stomach. Nonobstructive bowel gas pattern. Electronically Signed   By: Marlan Palau M.D.   On: 07/23/2018  10:06   Dg Abd Portable 1v  Result Date: 07/22/2018 CLINICAL DATA:  Orogastric tube placement. Altered mental status and hypoxia EXAM: PORTABLE ABDOMEN - 1 VIEW COMPARISON:  None. FINDINGS: Orogastric tube tip is at the gastroesophageal junction with the side port in the distal esophagus. There is moderate stool in the colon. There is no bowel dilatation or air-fluid level to suggest bowel obstruction. No free air. There is bibasilar lung atelectatic change. IMPRESSION: Orogastric tube tip at the gastroesophageal junction. Advise advancing orogastric tube 8-10 cm. No bowel obstruction or free air evident. Bibasilar lung atelectatic change. Electronically Signed   By: Bretta Bang III M.D.   On: 07/22/2018 14:00   Dg Swallowing Func-speech Pathology  Result Date: 07/28/2018 Objective Swallowing Evaluation: Type of Study: MBS-Modified Barium Swallow Study  Patient Details Name: Corey Li MRN: [id number] Date of Birth: 02-Jun-1970 Today's Date: 07/28/2018 Time: SLP Start Time (ACUTE ONLY): 1330 -SLP Stop Time (ACUTE ONLY): 1350 SLP Time Calculation (min) (ACUTE ONLY): 20 min Past Medical History: Past Medical History: Diagnosis Date . Anxiety  . Depression  . Diabetes mellitus without complication (HCC)   Type II . GERD (gastroesophageal reflux disease)  . H/O seasonal allergies  . Headache  . Hyperlipidemia  . Hypertension  . Hypothyroidism  . Neuropathy  . Numbness and tingling of both lower extremities  . PTSD (post-traumatic stress disorder)  . Sleep apnea  . Thyroid disease  Past Surgical History: Past Surgical History: Procedure Laterality Date . ANTERIOR CERVICAL DECOMP/DISCECTOMY FUSION N/A 07/15/2018  Procedure: ANTERIOR CERVICAL DECOMPRESSION/DISCECTOMY FUSION TWO LEVELS WITH CERVICAL SIX CORPECTOMY;  Surgeon: Estill Bamberg, MD;  Location: [facility] OR;  Service: Orthopedics;  Laterality: N/A; . HERNIA REPAIR    child . POSTERIOR CERVICAL FUSION/FORAMINOTOMY  07/16/2018  Procedure: Posterior Cervical  Fusion/Foraminotomy Level 2;  Surgeon: Estill Bamberg, MD;  Location: [facility] OR;  Service: Orthopedics;; HPI: Pt is a 48 yo male s/p anterior/posterior CDF 4/1. Presented from hotel room with AMS, hypoxia and right sided weakness. Intubated 4/8-4/11. MRI multifocal bilateral infarcts, right cerebellum, posterior cerebral artery, left basal gangles. Also fouind to have bacteremia, sepsis and DKA. Pt PMH: DM, anxiety, depression, HTN. CXR interval extubation and removal of enteric tube. Stable mild hazy and reticular opacities at the lung bases, left greater than right.  No data recorded Assessment / Plan / Recommendation CHL IP CLINICAL IMPRESSIONS 07/28/2018 Clinical Impression Pt demonstrated moderate-severe oropharyngeal dysphagia with penetration and brief aspiration with puree texture. Pt's anterior hyolaryngeal elevation was significantly decreased, no discernable epiglottic deflection or pharyngeal contraction attributed to silent penetration and brief aspiration of puree texture, max vallecular residue. Pt produced a delayed cough after approximately 4 minutes. Oral phase marked by lingual pumping, prolonged transit and lingual residue. Pt requires extended amount of time to produce dry swallow and he cannot produce effective cough to clear penetrates. Due to  limitations in pt's cognition and neck ROM, strategies were not attempted. He is not safe for po's and should continue non oral means of nutrition. Prognosis is good given age but may be extended given effects of A/P CDF and stroke on swallow function. Will continue intervention.     SLP Visit Diagnosis Dysphagia, oropharyngeal phase (R13.12) Attention and concentration deficit following -- Frontal lobe and executive function deficit following -- Impact on safety and function Moderate aspiration risk   CHL IP TREATMENT RECOMMENDATION 07/28/2018 Treatment Recommendations Therapy as outlined in treatment plan below   Prognosis 07/28/2018 Prognosis for Safe Diet  Advancement Good Barriers to Reach Goals Severity of deficits Barriers/Prognosis Comment -- CHL IP DIET RECOMMENDATION 07/28/2018 SLP Diet Recommendations NPO Liquid Administration via -- Medication Administration Via alternative means Compensations -- Postural Changes --   CHL IP OTHER RECOMMENDATIONS 07/28/2018 Recommended Consults -- Oral Care Recommendations Oral care QID Other Recommendations --   CHL IP FOLLOW UP RECOMMENDATIONS 07/28/2018 Follow up Recommendations Skilled Nursing facility   [facility] IP FREQUENCY AND DURATION 07/28/2018 Speech Therapy Frequency (ACUTE ONLY) min 2x/week Treatment Duration 2 weeks      CHL IP ORAL PHASE 07/28/2018 Oral Phase Impaired Oral - Pudding Teaspoon -- Oral - Pudding Cup -- Oral - Honey Teaspoon -- Oral - Honey Cup -- Oral - Nectar Teaspoon -- Oral - Nectar Cup -- Oral - Nectar Straw -- Oral - Thin Teaspoon -- Oral - Thin Cup -- Oral - Thin Straw -- Oral - Puree Delayed oral transit;Lingual pumping;Decreased bolus cohesion;Lingual/palatal residue;Reduced posterior propulsion Oral - Mech Soft -- Oral - Regular -- Oral - Multi-Consistency -- Oral - Pill -- Oral Phase - Comment --  CHL IP PHARYNGEAL PHASE 07/28/2018 Pharyngeal Phase Impaired Pharyngeal- Pudding Teaspoon -- Pharyngeal -- Pharyngeal- Pudding Cup -- Pharyngeal -- Pharyngeal- Honey Teaspoon -- Pharyngeal -- Pharyngeal- Honey Cup -- Pharyngeal -- Pharyngeal- Nectar Teaspoon -- Pharyngeal -- Pharyngeal- Nectar Cup -- Pharyngeal -- Pharyngeal- Nectar Straw -- Pharyngeal -- Pharyngeal- Thin Teaspoon -- Pharyngeal -- Pharyngeal- Thin Cup -- Pharyngeal -- Pharyngeal- Thin Straw -- Pharyngeal -- Pharyngeal- Puree Penetration/Aspiration during swallow;Reduced airway/laryngeal closure;Reduced laryngeal elevation;Reduced epiglottic inversion;Reduced anterior laryngeal mobility Pharyngeal Material enters airway, passes BELOW cords then ejected out Pharyngeal- Mechanical Soft -- Pharyngeal -- Pharyngeal- Regular -- Pharyngeal --  Pharyngeal- Multi-consistency -- Pharyngeal -- Pharyngeal- Pill -- Pharyngeal -- Pharyngeal Comment --  CHL IP CERVICAL ESOPHAGEAL PHASE 07/28/2018 Cervical Esophageal Phase WFL Pudding Teaspoon -- Pudding Cup -- Honey Teaspoon -- Honey Cup -- Nectar Teaspoon -- Nectar Cup -- Nectar Straw -- Thin Teaspoon -- Thin Cup -- Thin Straw -- Puree -- Mechanical Soft -- Regular -- Multi-consistency -- Pill -- Cervical Esophageal Comment -- [name] 07/28/2018, 4:24 PM [name] [name] administrator Pager [phone] Office [phone]              Ct Head Code Stroke Wo Contrast`  Result Date: 07/22/2018 CLINICAL DATA:  Code stroke.  Altered level of consciousness. EXAM: CT HEAD WITHOUT CONTRAST TECHNIQUE: Contiguous axial images were obtained from the base of the skull through the vertex without intravenous contrast. COMPARISON:  MRI head 06/29/2018, CT head 05/07/2018 FINDINGS: Brain: Ventricle size normal. Negative for acute infarct, hemorrhage, mass. No edema or midline shift. Vascular: Negative for hyperdense vessel Skull: Negative Sinuses/Orbits: Negative Other: None ASPECTS ([place] Stroke Program Early CT Score) - Ganglionic level infarction (caudate, lentiform nuclei, internal capsule, insula, M1-M3 cortex): 7 - Supraganglionic infarction (M4-M6 cortex): 3 Total score (0-10  with 10 being normal): 10 IMPRESSION: 1. No acute abnormality 2. ASPECTS is 10 Electronically Signed   By: Marlan Palau M.D.   On: 07/22/2018 13:00   Vas [place] Lower Extremity Venous (dvt)  Result Date: 07/24/2018  Lower Venous Study Indications: Stroke.  Performing Technologist: Blanch Media RVS  Examination Guidelines: A complete evaluation includes B-mode imaging, spectral Doppler, color Doppler, and power Doppler as needed of all accessible portions of each vessel. Bilateral testing is considered an integral part of a complete examination. Limited examinations for reoccurring indications may be  performed as noted.  Right Venous Findings: +---------+---------------+---------+-----------+----------+-------+          CompressibilityPhasicitySpontaneityPropertiesSummary +---------+---------------+---------+-----------+----------+-------+ CFV      Full           Yes      Yes                          +---------+---------------+---------+-----------+----------+-------+ SFJ      Full                                                 +---------+---------------+---------+-----------+----------+-------+ FV Prox  Full                                                 +---------+---------------+---------+-----------+----------+-------+ FV Mid   Full                                                 +---------+---------------+---------+-----------+----------+-------+ FV DistalFull                                                 +---------+---------------+---------+-----------+----------+-------+ PFV      Full                                                 +---------+---------------+---------+-----------+----------+-------+ POP      Full           Yes      Yes                          +---------+---------------+---------+-----------+----------+-------+ PTV      Full                                                 +---------+---------------+---------+-----------+----------+-------+ PERO     Full                                                 +---------+---------------+---------+-----------+----------+-------+  Left Venous Findings: +---------+---------------+---------+-----------+----------+-------+  CompressibilityPhasicitySpontaneityPropertiesSummary +---------+---------------+---------+-----------+----------+-------+ CFV      Full           Yes      Yes                          +---------+---------------+---------+-----------+----------+-------+ SFJ      Full                                                  +---------+---------------+---------+-----------+----------+-------+ FV Prox  Full                                                 +---------+---------------+---------+-----------+----------+-------+ FV Mid   Full                                                 +---------+---------------+---------+-----------+----------+-------+ FV DistalFull                                                 +---------+---------------+---------+-----------+----------+-------+ PFV      Full                                                 +---------+---------------+---------+-----------+----------+-------+ POP      Full           Yes      Yes                          +---------+---------------+---------+-----------+----------+-------+ PTV      Full                                                 +---------+---------------+---------+-----------+----------+-------+ PERO     Full                                                 +---------+---------------+---------+-----------+----------+-------+    Summary: Right: There is no evidence of deep vein thrombosis in the lower extremity. No cystic structure found in the popliteal fossa. Left: There is no evidence of deep vein thrombosis in the lower extremity. No cystic structure found in the popliteal fossa.  *See table(s) above for measurements and observations. Electronically signed by Fabienne Brunsharles Fields MD on 07/24/2018 at 6:57:15 PM.    Final       Subjective: Earlier this morning patient reported that he felt bloated/distended from tube feeds and was refusing to take same.  Since then RD adjusted his tube feeds, reduced volume and increased frequency with improvement in symptoms.  He also reports reflux  symptoms.  No vomiting.  Had BM yesterday.  Denies any other complaints.  As per RN, no acute issues noted.  Discharge Exam:  Vitals:   08/02/18 0411 08/02/18 0500 08/02/18 0843 08/02/18 1244  BP: 136/72  (!) 145/79 133/82  Pulse: 78   95 92  Resp: 18  16 17   Temp: 97.6 F (36.4 C)  98.7 F (37.1 C) 98 F (36.7 C)  TempSrc: Oral  Oral Oral  SpO2: 97%  98% 99%  Weight:  75.4 kg    Height:        Gen:  Pleasant young male, moderately built and nourished lying comfortably propped up in bed.  Oral mucosa moist. Pulm: Slightly diminished breath sounds in the bases but no wheezing, rhonchi or crackles.  Rest of lung fields clear to auscultation.  No increased work of breathing. CV: Regular rate and rhythm. No murmur, rub, or gallop. No JVD, no dependent edema. GI:  Abdomen is nondistended, soft and nontender.  PEG tube site clean dry and intact without acute findings.  No organomegaly or masses appreciated.  Normal bowel sounds heard. Ext: Right hemiplegia with grade 0 x 5 power.  Left limbs grade 5 x 5 power. Skin: No new rashes, lesions or ulcers on visualized skin, except PEG site as above. Neck incision sites appear well healing. Neuro:  Alert and oriented x2.  No facial asymmetry or dysarthria.    The results of significant diagnostics from this hospitalization (including imaging, microbiology, ancillary and laboratory) are listed below for reference.     Microbiology: Recent Results (from the past 240 hour(s))  SARS Coronavirus 2 [facility] order, Performed in [facility] hospital lab)     Status: None   Collection Time: 08/01/18 12:17 PM  Result Value Ref Range Status   SARS Coronavirus 2 NEGATIVE NEGATIVE Final    Comment: (NOTE) If result is NEGATIVE SARS-CoV-2 target nucleic acids are NOT DETECTED. The SARS-CoV-2 RNA is generally detectable in upper and lower  respiratory specimens during the acute phase of infection. The lowest  concentration of SARS-CoV-2 viral copies this assay can detect is 250  copies / mL. A negative result does not preclude SARS-CoV-2 infection  and should not be used as the sole basis for treatment or other  patient management decisions.  A negative result may occur with  improper  specimen collection / handling, submission of specimen other  than nasopharyngeal swab, presence of viral mutation(s) within the  areas targeted by this assay, and inadequate number of viral copies  (<250 copies / mL). A negative result must be combined with clinical  observations, patient history, and epidemiological information. If result is POSITIVE SARS-CoV-2 target nucleic acids are DETECTED. The SARS-CoV-2 RNA is generally detectable in upper and lower  respiratory specimens dur ing the acute phase of infection.  Positive  results are indicative of active infection with SARS-CoV-2.  Clinical  correlation with patient history and other diagnostic information is  necessary to determine patient infection status.  Positive results do  not rule out bacterial infection or co-infection with other viruses. If result is PRESUMPTIVE POSTIVE SARS-CoV-2 nucleic acids MAY BE PRESENT.   A presumptive positive result was obtained on the submitted specimen  and confirmed on repeat testing.  While 2019 novel coronavirus  (SARS-CoV-2) nucleic acids may be present in the submitted sample  additional confirmatory testing may be necessary for epidemiological  and / or clinical management purposes  to differentiate between  SARS-CoV-2 and other Sarbecovirus currently known  to infect humans.  If clinically indicated additional testing with an alternate test  methodology [phone]) is advised. The SARS-CoV-2 RNA is generally  detectable in upper and lower respiratory sp ecimens during the acute  phase of infection. The expected result is Negative. Fact Sheet for Patients:  BoilerBrush.com.cy Fact Sheet for Healthcare Providers: https://pope.com/ This test is not yet approved or cleared by the [place] FDA and has been authorized for detection and/or diagnosis of SARS-CoV-2 by FDA under an Emergency Use Authorization (EUA).  This EUA will remain in  effect (meaning this test can be used) for the duration of the COVID-19 declaration under Section 564(b)(1) of the Act, 21 U.S.C. section 360bbb-3(b)(1), unless the authorization is terminated or revoked sooner. Performed at [facility], [address] [place], [place] [address]      Labs: CBC: Recent Labs  Lab 07/27/18 [phone] 07/28/18 0431 07/29/18 0342 07/30/18 0521 08/01/18 0629 08/01/18 1641  WBC 14.4* 13.6* 11.6* 9.9 11.5* 11.8*  NEUTROABS 11.9* 10.6* 7.6  --   --   --   HGB 10.4* 10.0* 9.3* 10.3* 7.8* 8.0*  HCT 31.2* 29.5* 27.2* 30.1* 23.0* 22.8*  MCV 90.4 89.7 90.7 91.2 90.2 89.1  PLT 405* 506* 590* 747* 630* 646*   Basic Metabolic Panel: Recent Labs  Lab 07/27/18 0511 07/28/18 0431 07/29/18 0342 07/30/18 0521 08/01/18 0629  NA  --  135 137 134* 136  K  --  3.7 3.7 3.6 4.2  CL  --  102 101 96* 99  CO2  --  GLUCOSE  --  150* 70 78 72  BUN  --  CREATININE  --  0.85 0.76 0.75 0.65  CALCIUM  --  8.6* 8.7* 9.1 9.2  MG 2.2 2.2 2.3  --  2.3  PHOS 2.2* 2.6 2.7  --  2.9   CBG: Recent Labs  Lab 08/01/18 1943 08/02/18 0015 08/02/18 0347 08/02/18 0732 08/02/18 1146  GLUCAP 159* 349* 221* 144* 170*   Urinalysis    Component Value Date/Time   COLORURINE YELLOW 07/22/2018 1234   APPEARANCEUR HAZY (A) 07/22/2018 1234   LABSPEC 1.016 07/22/2018 1234   PHURINE 5.0 07/22/2018 1234   GLUCOSEU >=500 (A) 07/22/2018 1234   HGBUR MODERATE (A) 07/22/2018 1234   BILIRUBINUR NEGATIVE 07/22/2018 1234   KETONESUR 20 (A) 07/22/2018 1234   PROTEINUR 100 (A) 07/22/2018 1234   NITRITE NEGATIVE 07/22/2018 1234   LEUKOCYTESUR NEGATIVE 07/22/2018 1234    Unsuccessfully attempted to reach patient's friend to update care.  Time coordinating discharge: 50 minutes  SIGNED:  Marcellus Scott, MD, FACP, [facility]. [facility]  To contact the attending provider between 7A-7P or the covering provider during after hours 7P-7A, please log into  the web site [email or web address] and access using universal [place] password for that web site. If you do not have the password, please call the hospital operator.

## 2018-08-02 NOTE — Discharge Instructions (Signed)

## 2018-08-02 NOTE — Progress Notes (Signed)
Physical Therapy Treatment Patient Details Name: Corey Li MRN: [id number] DOB: 01-Feb-1971 Today's Date: 08/02/2018    History of Present Illness Pt is a 48 yo male s/p multifocal infarcts, bacteremia, sepsis and DKA. Pt PMHx: 4/1 A/P CDF C5-7, DM, anxiety, depression, HTN.    PT Comments    Pt following simple commands today and participatory with session. Mod A +2 for bed mobility and continues with R lean. Pt practiced standing from bed and recliner, good initiation, min A +2 to steady and provide R sided support. Pt maintained standing in stedy for pivot to chair. Worked on wt shifting in standing for pre gait, unable to step feet yet. Would really benefit from CIR setting for rehab though noted that this does not look possible in which case recommend SNF.   Follow Up Recommendations  CIR     Equipment Recommendations  Other (comment)(TBD next session)    Recommendations for Other Services       Precautions / Restrictions Precautions Precautions: Cervical;Fall Precaution Comments: G-tube Required Braces or Orthoses: Cervical Brace Cervical Brace: Hard collar;At all times(refusing due to discomfort) Restrictions Weight Bearing Restrictions: No    Mobility  Bed Mobility Overal bed mobility: Needs Assistance Bed Mobility: Rolling;Sidelying to Sit Rolling: Mod assist Sidelying to sit: +2 for physical assistance;Mod assist;HOB elevated       General bed mobility comments: rolled R with mod A, mod +2 for SL to sit and to scoot to EOB, R lean  Transfers Overall transfer level: Needs assistance   Transfers: Sit to/from Stand;Stand Pivot Transfers Sit to Stand: +2 physical assistance;Min assist Stand pivot transfers: Total assist       General transfer comment: pt with good initiation to standing, min A +2 to steady and give support to R side. Pt maintained standing throughout pivot to chair on stedy. Also practiced sit to stand from recliner. Pt unable to step feet to  pivot.   Ambulation/Gait                 Stairs             Wheelchair Mobility    Modified Rankin (Stroke Patients Only)       Balance Overall balance assessment: Needs assistance Sitting-balance support: Single extremity supported;Feet supported Sitting balance-Leahy Scale: Poor Sitting balance - Comments: needed min/ mod A to maintain sitting due to R lean Postural control: Right lateral lean Standing balance support: Single extremity supported Standing balance-Leahy Scale: Poor Standing balance comment: worked on wt shifting in standing on floor, mod A +2 with support R knee and elbow.                             Cognition Arousal/Alertness: Awake/alert Behavior During Therapy: Flat affect Overall Cognitive Status: Impaired/Different from baseline Area of Impairment: Attention;Following commands;Safety/judgement;Problem solving;Awareness                   Current Attention Level: Sustained   Following Commands: Follows one step commands with increased time;Follows one step commands consistently Safety/Judgement: Decreased awareness of safety;Decreased awareness of deficits Awareness: Intellectual Problem Solving: Slow processing;Decreased initiation;Difficulty sequencing;Requires tactile cues;Requires verbal cues General Comments: pt cooperative during session and following single step commands      Exercises General Exercises - Lower Extremity Ankle Circles/Pumps: Both;10 reps;Supine Quad Sets: AROM;Right;5 reps;Supine Long Arc Quad: AROM;Seated;Right;5 reps Straight Leg Raises: AAROM;Right;5 reps;Supine    General Comments  Pertinent Vitals/Pain Pain Assessment: Faces Faces Pain Scale: Hurts even more Pain Location: neck and shoulders Pain Descriptors / Indicators: Sore;Grimacing;Guarding Pain Intervention(s): Repositioned    Home Living                      Prior Function            PT Goals  (current goals can now be found in the care plan section) Acute Rehab PT Goals Patient Stated Goal: unable to state PT Goal Formulation: With patient Time For Goal Achievement: 08/07/18 Potential to Achieve Goals: Fair Progress towards PT goals: Progressing toward goals    Frequency    Min 4X/week      PT Plan Current plan remains appropriate    Co-evaluation              AM-PAC PT "6 Clicks" Mobility   Outcome Measure  Help needed turning from your back to your side while in a flat bed without using bedrails?: A Lot Help needed moving from lying on your back to sitting on the side of a flat bed without using bedrails?: A Lot Help needed moving to and from a bed to a chair (including a wheelchair)?: Total Help needed standing up from a chair using your arms (e.g., wheelchair or bedside chair)?: A Lot Help needed to walk in hospital room?: Total Help needed climbing 3-5 steps with a railing? : Total 6 Click Score: 9    End of Session Equipment Utilized During Treatment: Gait belt Activity Tolerance: Patient tolerated treatment well Patient left: with call bell/phone within reach;in chair;with chair alarm set Nurse Communication: Mobility status PT Visit Diagnosis: Other abnormalities of gait and mobility (R26.89);Pain;Hemiplegia and hemiparesis Hemiplegia - Right/Left: Right Hemiplegia - dominant/non-dominant: Dominant Hemiplegia - caused by: Cerebral infarction Pain - Right/Left: Left Pain - part of body: Arm     Time: [id number] PT Time Calculation (min) (ACUTE ONLY): 24 min  Charges:  $Therapeutic Activity: 8-22 mins $Neuromuscular Re-education: 8-22 mins                     Lyanne CoVictoria Jaisean Monteforte, PT  Acute Rehab Services  Pager [phone] Office [phone]    Lawana ChambersVictoria L Trenae Brunke 08/02/2018, 1:24 PM

## 2018-08-02 NOTE — [place] FL2 (Addendum)
[place] MEDICAID FL2 LEVEL OF CARE SCREENING TOOL     IDENTIFICATION  Patient Name: Corey Li Birthdate: 05-May-1970 Sex: male Admission Date (Current Location): 07/22/2018  [facility] and [place] Number:  Producer, television/film/video and Address:  [facility], [address], [place], [place] [address]      Provider Number: [id number]  Attending Physician Name and Address:  Elease Etienne, MD  Relative Name and Phone Number:  Algis Liming [phone]    Current Level of Care: Hospital Recommended Level of Care: Skilled Nursing Facility Prior Approval Number:    Date Approved/Denied:   PASRR Number:   [id number]  Discharge Plan: SNF    Current Diagnoses: Patient Active Problem List   Diagnosis Date Noted  . Cerebral embolism with cerebral infarction 07/24/2018  . Encounter for intubation   . Acute respiratory failure with hypoxia (HCC)   . DKA (diabetic ketoacidoses) (HCC) 07/22/2018  . Radiculopathy 07/15/2018    Orientation RESPIRATION BLADDER Height & Weight     Self, Place  Normal Incontinent, External catheter Weight: 166 lb 3.6 oz (75.4 kg) Height:  5\' 9"  (175.3 cm)  BEHAVIORAL SYMPTOMS/MOOD NEUROLOGICAL BOWEL NUTRITION STATUS      Incontinent TNA(Peg tube: Glucerna 1.2 cal 6x daily and pro-stat sugar free 2x daily)  AMBULATORY STATUS COMMUNICATION OF NEEDS Skin   Extensive Assist Verbally Surgical wounds(two incisions on next anterior and posterior (dressing changes PRN_)                       Personal Care Assistance Level of Assistance  Bathing, Feeding, Dressing, Total care Bathing Assistance: Maximum assistance Feeding assistance: Maximum assistance Dressing Assistance: Maximum assistance Total Care Assistance: Maximum assistance   Functional Limitations Info  Sight, Hearing, Speech Sight Info: Adequate Hearing Info: Adequate Speech Info: Adequate    SPECIAL CARE FACTORS FREQUENCY  PT (By licensed PT), OT (By  licensed OT), Speech therapy     PT Frequency: 5x/wk OT Frequency: 5x/wk     Speech Therapy Frequency: 2x/wk      Contractures Contractures Info: Not present    Additional Factors Info  Code Status, Allergies, Insulin Sliding Scale Code Status Info: Full Code Allergies Info: No Known Allergies   Insulin Sliding Scale Info: insulin aspart novolog 0-15 units every 4 hours and insulin detemir (levemir) 10 units every 12 hours       Current Medications (08/02/2018):  This is the current hospital active medication list Current Facility-Administered Medications  Medication Dose Route Frequency Provider Last Rate Last Dose  . 0.9 %  sodium chloride infusion   Intravenous PRN Coralyn Helling, MD 10 mL/hr at 07/23/18 0700    . acetaminophen (TYLENOL) tablet 650 mg  650 mg Per Tube Q4H PRN Elease Etienne, MD   650 mg at 08/02/18 1207  . albuterol (PROVENTIL) (2.5 MG/3ML) 0.083% nebulizer solution 2.5 mg  2.5 mg Nebulization Q2H PRN Bowser, Kaylyn Layer, NP      . amLODipine (NORVASC) tablet 10 mg  10 mg Per Tube Daily Elease Etienne, MD   10 mg at 08/02/18 0928  . [START ON 08/03/2018] aspirin tablet 325 mg  325 mg Per Tube Daily Hongalgi, Anand D, MD      . atorvastatin (LIPITOR) tablet 80 mg  80 mg Per Tube Daily Elease Etienne, MD   80 mg at 08/02/18 0928  . bisacodyl (DULCOLAX) suppository 10 mg  10 mg Rectal Daily PRN Bowser, Kaylyn Layer,  NP      . docusate (COLACE) 50 MG/5ML liquid 100 mg  100 mg Per Tube BID PRN Bowser, Kaylyn LayerGrace E, NP      . enoxaparin (LOVENOX) injection 40 mg  40 mg Subcutaneous Q24H Hoyt KochMatthews, Kacie Sue-Ellen, PA   40 mg at 08/01/18 1821  . feeding supplement (GLUCERNA 1.2 CAL) liquid 237 mL  237 mL Per Tube 6 X Daily Elease EtienneHongalgi, Anand D, MD   237 mL at 08/02/18 1207  . feeding supplement (PRO-STAT SUGAR FREE 64) liquid 30 mL  30 mL Per Tube BID Marinda ElkFeliz Ortiz, Abraham, MD   30 mL at 08/02/18 0930  . fentaNYL (SUBLIMAZE) injection 100 mcg  100 mcg Intravenous Q2H PRN Lanier ClamBowser,  Grace E, NP   100 mcg at 07/24/18 1843  . ferrous sulfate 300 (60 Fe) MG/5ML syrup 300 mg  300 mg Per Tube BID WC Hongalgi, Anand D, MD      . free water 200 mL  200 mL Per Tube TID BM Hongalgi, Anand D, MD   200 mL at 08/02/18 1356  . hydrALAZINE (APRESOLINE) injection 10 mg  10 mg Intravenous Q4H PRN Selmer DominionSimpson, Paula B, NP   10 mg at 07/23/18 1703  . insulin aspart (novoLOG) injection 0-15 Units  0-15 Units Subcutaneous Q4H Elease EtienneHongalgi, Anand D, MD   3 Units at 08/02/18 1206  . insulin detemir (LEVEMIR) injection 10 Units  10 Units Subcutaneous Q12H Elease EtienneHongalgi, Anand D, MD   10 Units at 08/02/18 [phone]  . ipratropium-albuterol (DUONEB) 0.5-2.5 (3) MG/3ML nebulizer solution 3 mL  3 mL Nebulization BID PRN Hongalgi, Anand D, MD      . levothyroxine (SYNTHROID, LEVOTHROID) tablet 175 mcg  175 mcg Per Tube QAC breakfast Coralyn HellingSood, Vineet, MD   175 mcg at 08/02/18 0500  . lisinopril (ZESTRIL) tablet 20 mg  20 mg Per Tube Daily Elease EtienneHongalgi, Anand D, MD   20 mg at 08/02/18 0929  . MEDLINE mouth rinse  15 mL Mouth Rinse BID Bobette Mortiz, David Manuel, MD   15 mL at 08/02/18 0940  . pantoprazole sodium (PROTONIX) 40 mg/20 mL oral suspension 40 mg  40 mg Per Tube Daily Elease EtienneHongalgi, Anand D, MD   40 mg at 08/02/18 1355     Discharge Medications: Please see discharge summary for a list of discharge medications.  Relevant Imaging Results:  Relevant Lab Results:   Additional Information SSN: [id number]  Nada BoozerCaitlin B Lasondra Hodgkins, LCSWA

## 2018-08-02 NOTE — TOC Transition Note (Signed)
Transition of Care [facility]) - CM/SW Discharge Note   Patient Details  Name: Kolben Defreese MRN: [id number] Date of Birth: 1970-08-17  [facility]) CM/SW Contact:  Gwenlyn Fudge, LCSWA Phone Number: 08/02/2018, 2:21 PM   Clinical Narrative:     Nurse to call report to [phone] and patient will go to room 124. PTAR has been called and scheduled.     Final next level of care: Skilled Nursing Facility Barriers to Discharge: No Barriers Identified   Patient Goals and CMS Choice Patient states their goals for this hospitalization and ongoing recovery are:: No goal stated CMS Medicare.gov Compare Post Acute Care list provided to:: Patient Choice offered to / list presented to : NA  Discharge Placement PASRR number recieved: 08/02/18            Patient chooses bed at: Other - please specify in the comment section below:([facility]) Patient to be transferred to facility by: PTAR Name of family member notified: Angie Patient and family notified of of transfer: 08/02/18  Discharge Plan and Services                DME Arranged: N/A DME Agency: NA HH Arranged: NA HH Agency: NA   Social Determinants of Health (SDOH) Interventions     Readmission Risk Interventions Readmission Risk Prevention Plan 07/29/2018 07/27/2018  Social Work Consult for Recovery Care Planning/Counseling Complete -  Medication Review [facility]) - Referral to Pharmacy

## 2018-08-02 NOTE — Progress Notes (Signed)
Attempted twice to connect with friend Angie via tele visit before patient transferred but the attempts were unsuccessful.

## 2018-08-02 NOTE — Progress Notes (Signed)
Nutrition Brief Note RD working remotely.  RD received page to on-call pager. Patient finally made it to goal bolus TF regimen yesterday of Glucerna 1.2 Cal 480 mL (2 cartons/ARCs) TID per tube. Patient reporting today that he is having difficulty tolerating that amount of volume at a time and was actually refusing his feeds today. Discussed we can spread feedings out more over the day.  Enteral Access: 20 Fr. G-tube placed by IR on 4/16  New regimen:  -Bolus tube feeds using Glucerna 1.2 Cal 237 mL (1 carton/ARC) given 6 times daily per tube. -Continue Pro-Stat 30 mL BID per tube. -Tube feed regimen still provides 1928 kcal (100% of needs), 116 grams of protein, and 1166 mL H2O daily.  -RD also recommends 200 mL free water flush given TID between boluses as IV fluids have been discontinued.  Helane Rima, MS, RD, LDN Office: [phone] Pager: [phone] After Hours/Weekend Pager: [phone]

## 2018-08-03 ENCOUNTER — Encounter: Payer: Self-pay | Admitting: *Deleted

## 2018-08-03 ENCOUNTER — Other Ambulatory Visit: Payer: Self-pay | Admitting: Physician Assistant

## 2018-08-03 DIAGNOSIS — I4891 Unspecified atrial fibrillation: Secondary | ICD-10-CM

## 2018-08-03 DIAGNOSIS — I639 Cerebral infarction, unspecified: Secondary | ICD-10-CM

## 2018-08-03 NOTE — Progress Notes (Signed)
Patient ID: Corey Li, male   DOB: 03-02-1971, 48 y.o.   MRN: [id number] Patient enrolled for [employer] to ship a 30 day cardiac event monitor to [facility] skilled nursing facility, [address], [address], attention: Charge Nurse, [place], [place] [address].  [employer] provided with insurance/ subscriber # information provided by SNF, which was [facility] # [phone].

## 2018-08-08 ENCOUNTER — Ambulatory Visit (INDEPENDENT_AMBULATORY_CARE_PROVIDER_SITE_OTHER): Payer: Self-pay

## 2018-08-08 DIAGNOSIS — I639 Cerebral infarction, unspecified: Secondary | ICD-10-CM

## 2018-08-08 DIAGNOSIS — I4891 Unspecified atrial fibrillation: Secondary | ICD-10-CM

## 2018-09-08 ENCOUNTER — Other Ambulatory Visit: Payer: Self-pay

## 2018-09-29 ENCOUNTER — Ambulatory Visit (INDEPENDENT_AMBULATORY_CARE_PROVIDER_SITE_OTHER): Payer: Self-pay | Admitting: Neurology

## 2018-09-29 ENCOUNTER — Encounter: Payer: Self-pay | Admitting: Neurology

## 2018-09-29 ENCOUNTER — Other Ambulatory Visit: Payer: Self-pay

## 2018-09-29 DIAGNOSIS — I639 Cerebral infarction, unspecified: Secondary | ICD-10-CM

## 2018-09-29 NOTE — Progress Notes (Signed)
Virtual Visit via Video Note  I connected with Corey Li on 09/29/18 at  2:00 PM EDT by a video enabled telemedicine application and verified that I am speaking with the correct person using two identifiers.  Location: [facility] at [facility] [place] [facility] Ref MD : Dr Roda ShuttersXu Reason for referral : stroke   I discussed the limitations of evaluation and management by telemedicine and the availability of in person appointments. The patient expressed understanding and agreed to proceed.  This visit was set up to be performed using [email or web address] app but it was not working hence it was changed to face time app for audio and visual.  The patient's nurse facilitated this visit at the skilled nursing facility and was present throughout the visit History of Present Illness: Corey Li is a 48 year old Caucasian male seen today for initial virtual video consultation visit following stroke.  History is obtained from the patient and his nurse and have personally reviewed electronic medical records and imaging films in PACS.  Corey Billingsis a 48 y.o.malewith a history of T2DM with peripheral neuropathy, HTN, HLD, hypothyroid, GERD, sleep apnea, S/p A/ PCDF C5-C7, on 4/1/ 4/2 by Dr. Estill BambergMark Dumonski for radiculopathy, who presented from a hotel room with altered mental status and hypoxia with some right[facility]sided [facility] He appeared septic and had DKA.  Patient on postop day 6 following his neck surgery was found to have altered mental status and wandering the hallways of the hotel.  EMS brought him and he was found to have respiratory distress and was intubated.  When examined by neurology was found to be obtunded with NIH stroke scale of 31.  MRI scan of the brain showed multifocal acute ischemia throughout multiple vascular territories involving the bilateral PCA and MCA and patent compatible with cardioembolic source.  The largest stroke was in the posterior  cerebral arteries bilaterally.  MRI of the brain showed moderate to severe distal P2 segment stenosis in both posterior cerebral arteries.  LDL cholesterol 72 mg percent.  Hemoglobin A1c was 10.9.  He had 1 out of 2 blood cultures positive for coagulase negative staph which was felt to be a contaminant.  TEE was not done.  2D echo showed normal ejection fraction.  Lower extremity Dopplers were negative for DVT.  Patient was treated in the ICU initially and subsequently extubated and then [facility] to [facility] with medical service and after several days in the hospital he was discharged to skilled nursing facility where he still there.  Patient is made gradual improvement.  He still has some mild right sided weakness.  He is able to eat and feed himself.  He is able to walk with a walker but requires 1 person assist.  He is getting ongoing physical occupational and speech therapies.  Patient has completed a 30-day outpatient cardiac monitor with the results of which are not back yet.  He continues to have some vision difficulties particularly in the lower visual fields bilaterally.  His speech is still slurred but he can be understood.  He is on aspirin and tolerating well without side effects. Observations/Objective: Physical and neurological exams are limited due to constraints from virtual video visit.  Patient appears pleasant awake alert and cooperative.  He is oriented to time place and person.  His speech is dysarthric but he can be understood.  He follows commands well.  Extraocular movements are full range.  He has mild saccadic dysmetria on  lateral gaze bilaterally.  He subjectively says he has bilateral visual field defects in both eyes in the lower quadrants.  He has right lower facial weakness.  Tongue is midline.  Motor system exam shows right upper extremity drift with significant weakness is able to lift his arm slightly off the bed and he can bend his fingers.  He has slight right lower  extremity drift.  He he has subjective diminished sensation on the right upper and lower extremities.  He is sitting in a wheelchair his gait was not tested. NIH stroke scale 9.  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  Vascular risk factors of diabetes, hypertension, hyperlipidemia.  He still has significant persistent vision deficits and right sided weakness. PLAN : I had a long discussion with the patient and his nurse regarding his recent embolic strokes discuss results of hospital evaluation and treatment and prevention plan and answered questions.  Continue aspirin for stroke prevention with strict control of hypertension with blood pressure goal below 130/90, lipids with LDL cholesterol goal below 70 mg percent and diabetes with hemoglobin A1c goal below 6.5%.  He was encouraged to eat a healthy diet with lots of vegetables, fruits, whole grains and cereals.  He was advised to use a walker and to ambulate safely and avoid falls and injuries.  Will follow results of the 30-day heart monitor but patient may not be a good long-term anticoagulation candidate due to his significant visual and physical and gait difficulties.   Follow Up Instructions: Follow-up in the future in the office as needed. I discussed the assessment and treatment plan with the patient. The patient was provided an opportunity to ask questions and all were answered. The patient agreed with the plan and demonstrated an understanding of the instructions.   The patient was advised to call back or seek an in-person evaluation if the symptoms worsen or if the condition fails to improve as anticipated.  I provided 45 minutes of non-face-to-face time during this encounter.   [name], MD

## 2018-10-07 ENCOUNTER — Ambulatory Visit (HOSPITAL_COMMUNITY): Payer: Self-pay | Attending: Neurology

## 2018-10-21 ENCOUNTER — Telehealth: Payer: Self-pay | Admitting: *Deleted

## 2018-10-21 NOTE — Telephone Encounter (Signed)
-----   Message from Rosalin Hawking, MD sent at 10/14/2018  5:12 PM EDT ----- Could you please let the patient know that the heart monitoring test done recently in our office was negative for irregular heart beats. Please continue current treatment. Thanks.  Rosalin Hawking, MD PhD Stroke Neurology 10/14/2018 5:12 PM

## 2018-10-21 NOTE — Telephone Encounter (Signed)
I called pt and relayed that the cardiac even monitor testing results showed no irregular heartbeats (negative study).  Dr. Erlinda Hong , stroke neurology wanted [place] to relay results.  Pt verbalized understanding.

## 2019-01-20 ENCOUNTER — Ambulatory Visit: Payer: Self-pay | Admitting: Neurology

## 2019-03-17 ENCOUNTER — Ambulatory Visit: Payer: Self-pay | Admitting: Neurology

## 2019-03-17 ENCOUNTER — Telehealth: Payer: Self-pay

## 2019-03-17 NOTE — Telephone Encounter (Signed)
Patient no show for appt today. 

## 2019-03-23 ENCOUNTER — Encounter: Payer: Self-pay | Admitting: Neurology

## 2019-12-03 IMAGING — CT CT ABDOMEN WITHOUT CONTRAST
2 of 5 series · 17 of 46 positions shown, 19 images · non-contrast
Comparison: None.

CLINICAL DATA: Abnormal swallowing study with severe oro pharyngeal
dysphagia. Assessment abdominal anatomy for possible gastrostomy
tube placement.

EXAM:
CT ABDOMEN WITHOUT CONTRAST
TECHNIQUE: Multidetector CT imaging of the abdomen was performed following the
standard protocol without IV contrast.

[Series 5: thins · axial · 0.81mm/px · z∈[+971,+1286]mm · 14 of 489 slices shown, 16 images]
[im 19/489  soft-tissue]
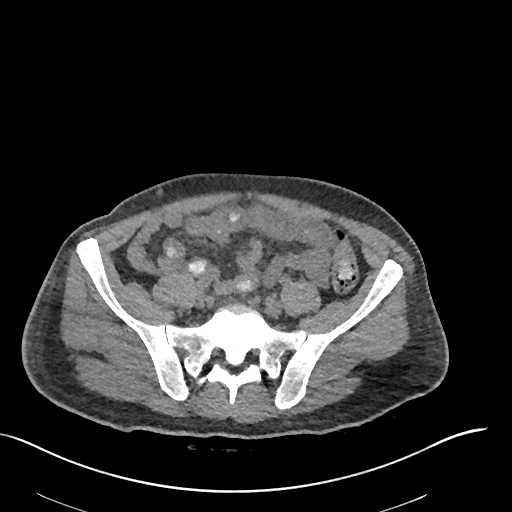
[im 19/489  bone]
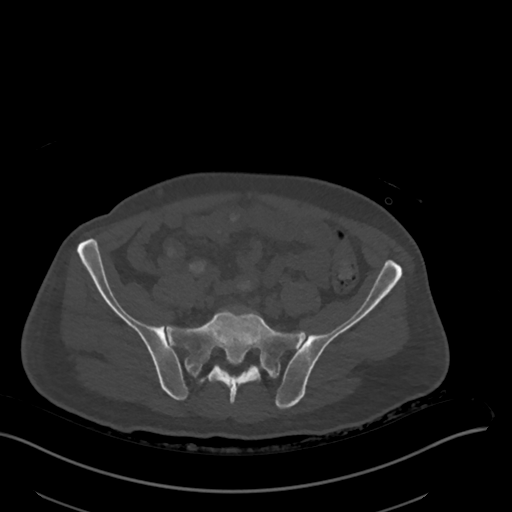
[im 57/489  soft-tissue]
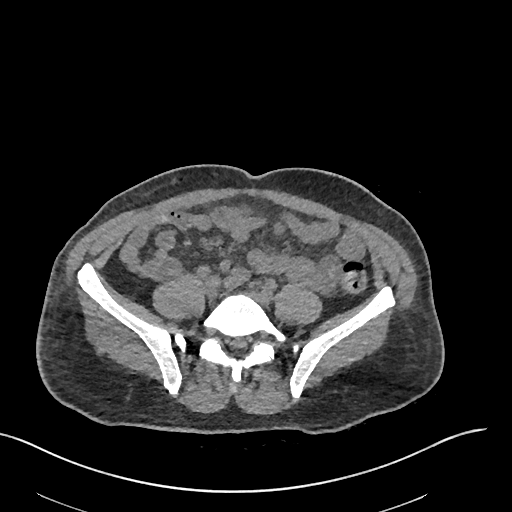
[im 94/489  soft-tissue]
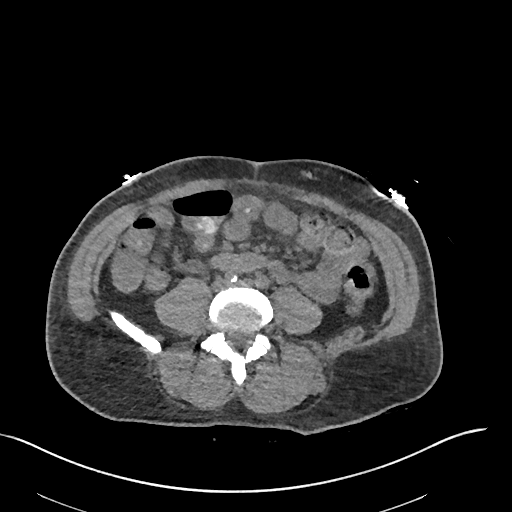
[im 132/489  soft-tissue]
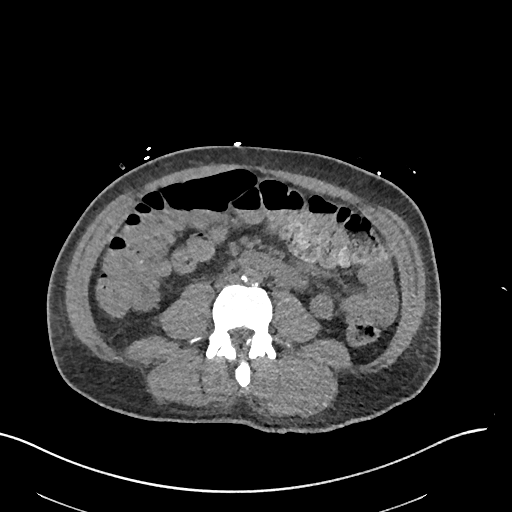
[im 169/489  soft-tissue]
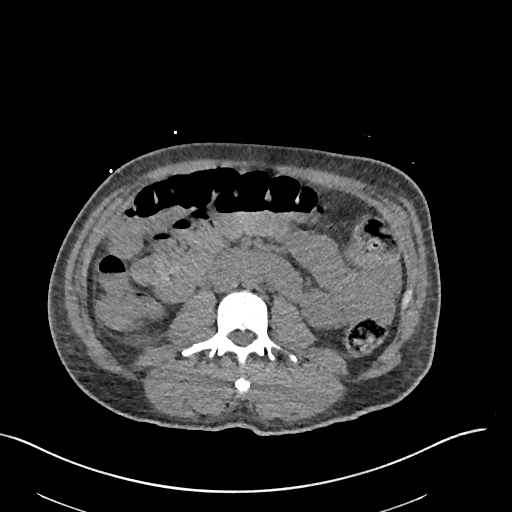
[im 188/489  soft-tissue]
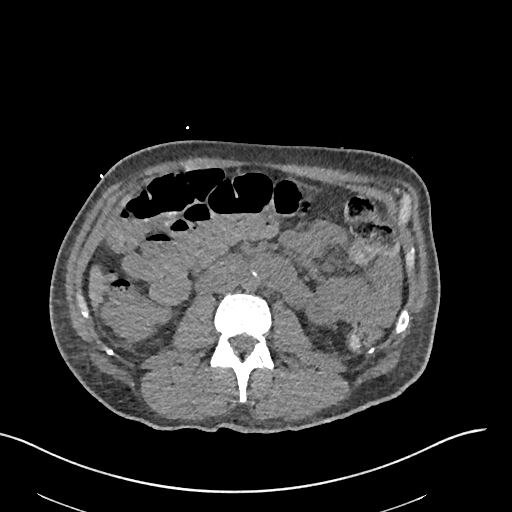
[im 226/489  soft-tissue]
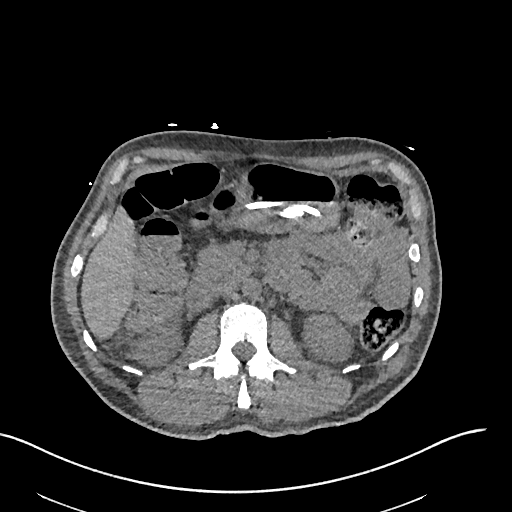
[im 263/489  soft-tissue]
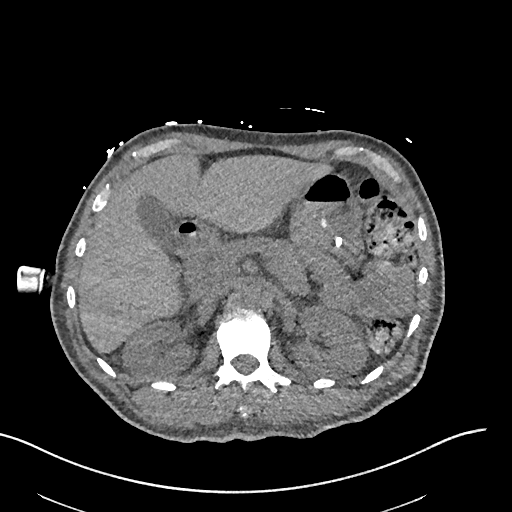
[im 301/489  soft-tissue]
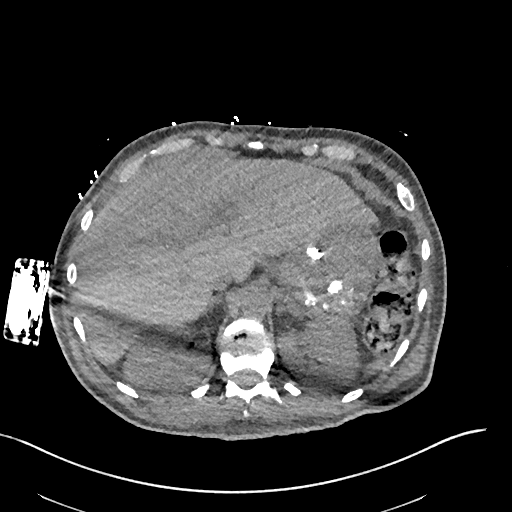
[im 301/489  bone]
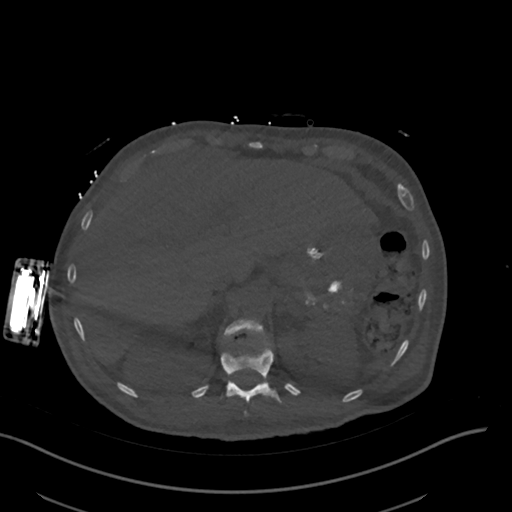
[im 320/489  soft-tissue]
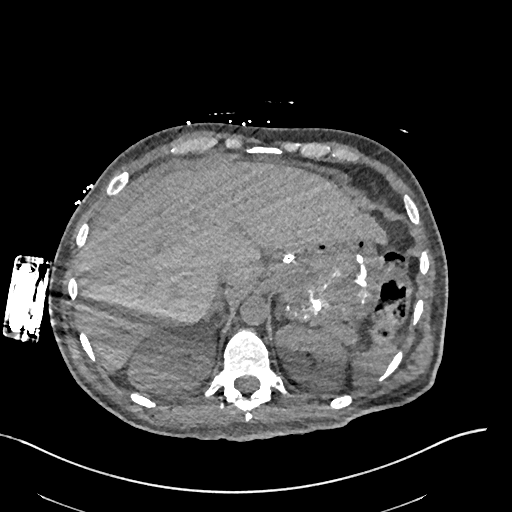
[im 357/489  soft-tissue]
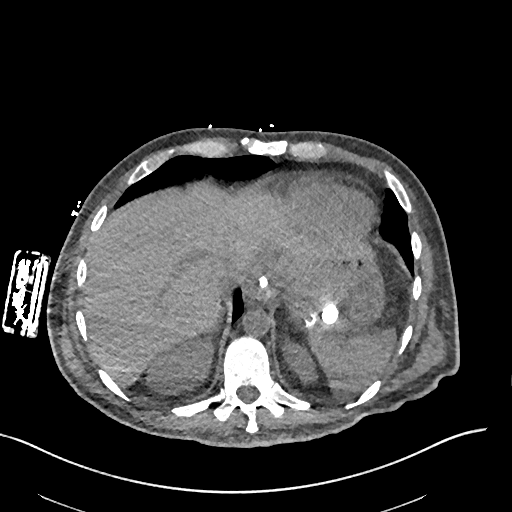
[im 395/489  soft-tissue]
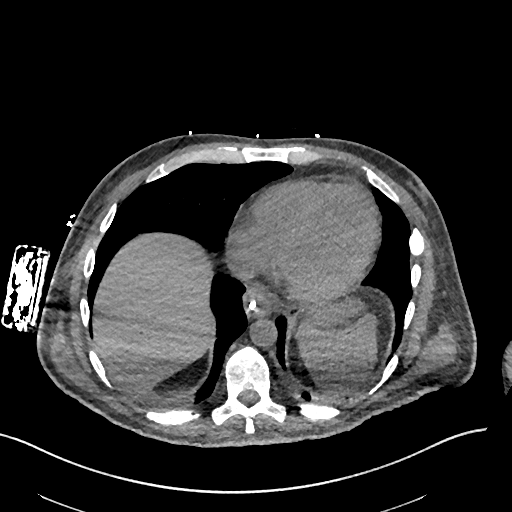
[im 432/489  soft-tissue]
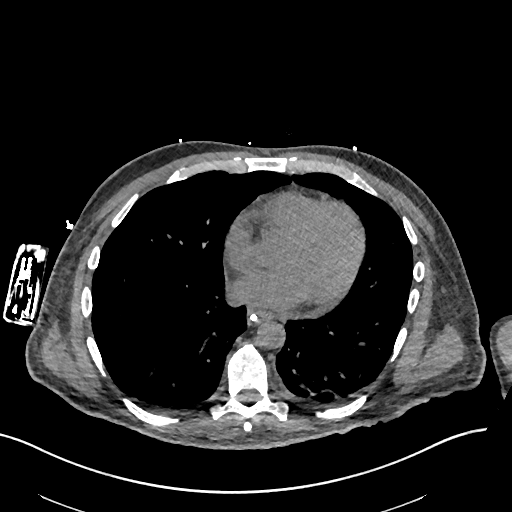
[im 470/489  soft-tissue]
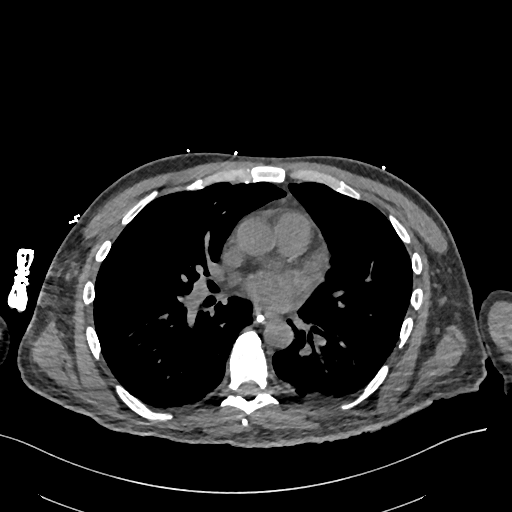

[Series 6: cor st · coronal · 0.70mm/px · 3 of 101 slices shown]
[im 34/101  soft-tissue]
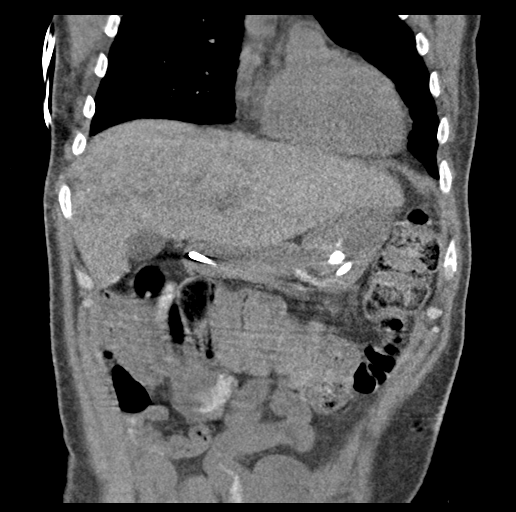
[im 45/101  soft-tissue]
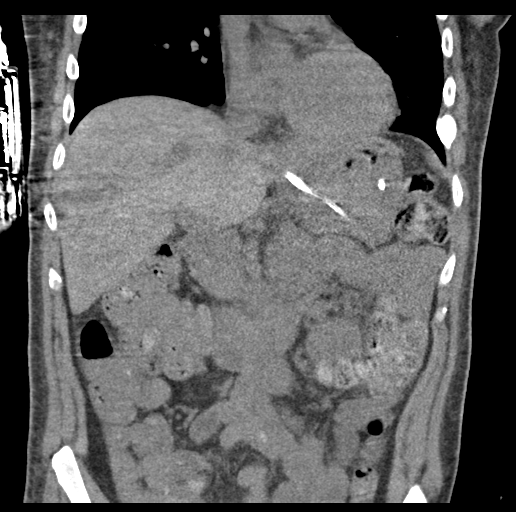
[im 56/101  soft-tissue]
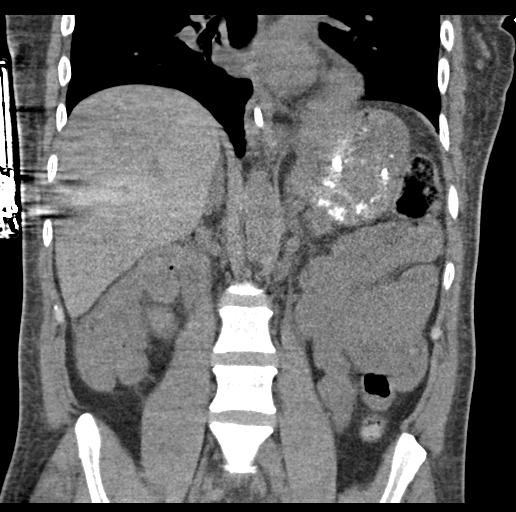

[17 of 46 positions shown; findings below may reference images not displayed]

FINDINGS: Lower chest: Mild bibasilar atelectasis. No pleural effusions. No
hiatal hernia identified.

Hepatobiliary: No focal liver abnormality is seen. No gallstones,
gallbladder wall thickening, or biliary dilatation.

Pancreas: Unremarkable. No pancreatic ductal dilatation or
surrounding inflammatory changes.

Spleen: Normal in size without focal abnormality.

Adrenals/Urinary Tract: Adrenal glands are unremarkable. Kidneys are
normal, without renal calculi, focal lesion, or hydronephrosis.

Stomach/Bowel: Indwelling feeding tube extends through the stomach
and terminates in the region of the duodenal bulb. Gastric anatomy
appears normal with the stomach lying superior to the transverse
colon. No evidence of bowel obstruction or significant ileus. No
free air identified. No incidental masses. Tiny benign-appearing
mesenteric calcification in the anterior peritoneal cavity just
below the umbilicus.

Vascular/Lymphatic: Mild calcified plaque in a normal caliber
abdominal aorta. No lymphadenopathy visualized.

Other: No abdominal wall hernias, ascites or abnormal fluid
collections identified. No evidence of anasarca or body wall edema.

Musculoskeletal: No acute or significant osseous findings.
IMPRESSION: Normal bowel anatomy with no contraindication to potential future
percutaneous gastrostomy tube placement. No acute findings in the
abdomen. The indwelling feeding tube extends to the region of the
duodenal bulb.

## 2019-12-05 IMAGING — XA PERC PLACEMENT GASTROSTOMY
1 series · 4 of 4 positions shown · non-contrast
Comparison: none

INDICATION: 48-year-old with CVA and dysphagia. Scheduled for percutaneous
gastrostomy tube.

[Series 300: dsa body · 4 of 4 slices shown]
[im 1/4]
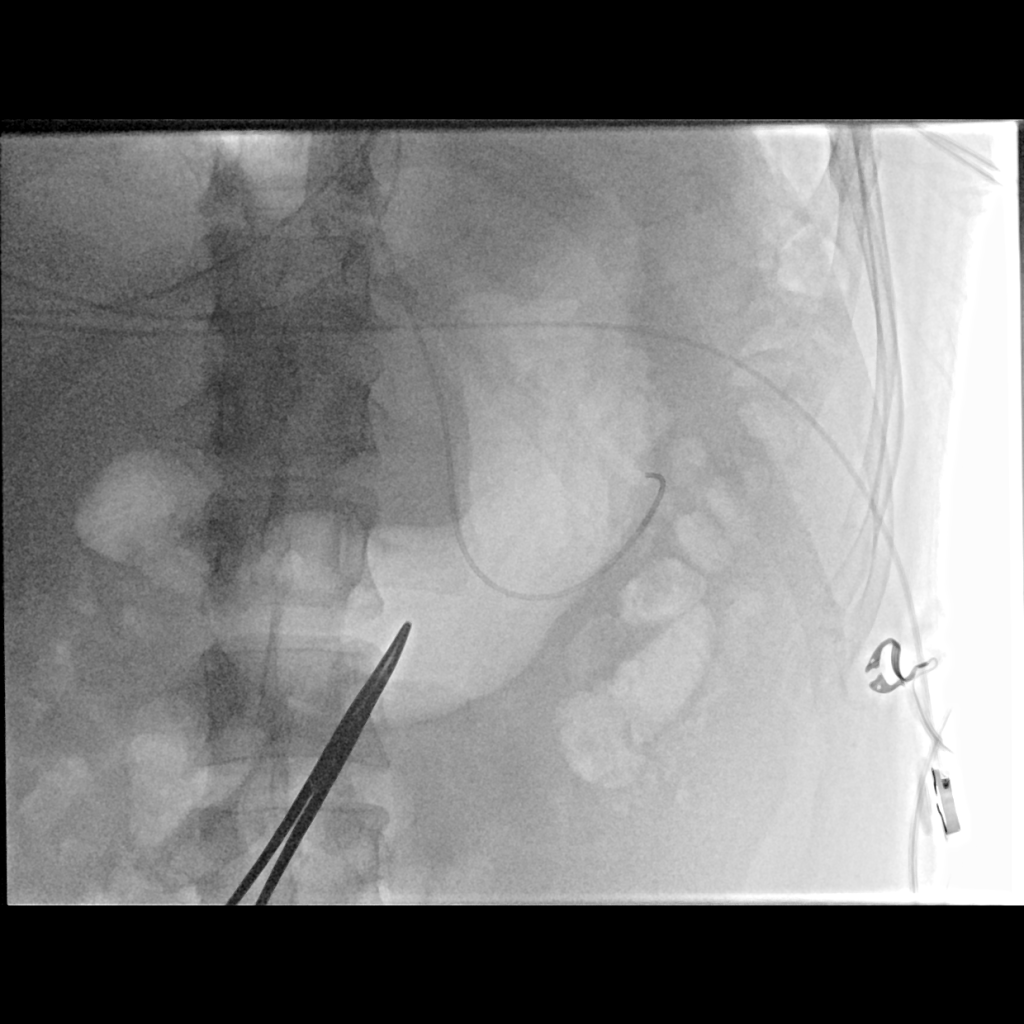
[im 2/4]
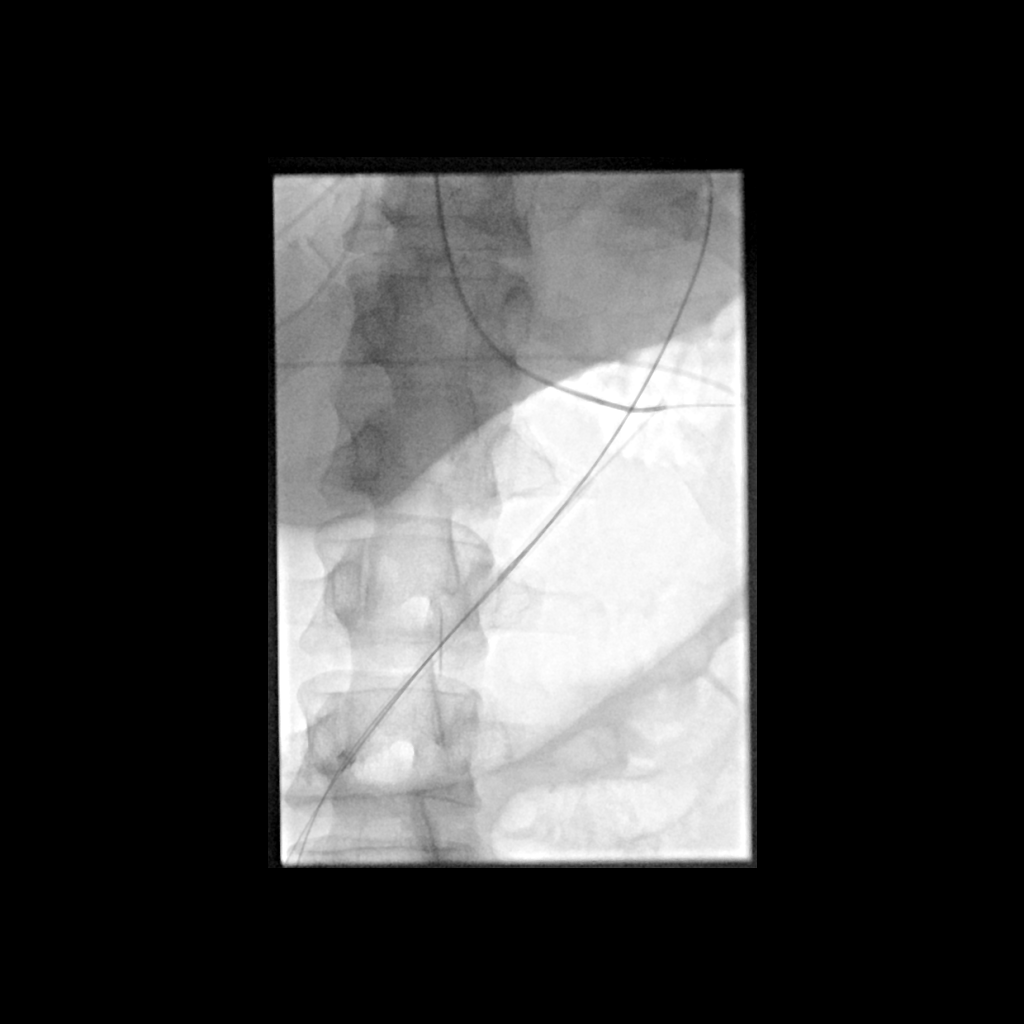
[im 3/4]
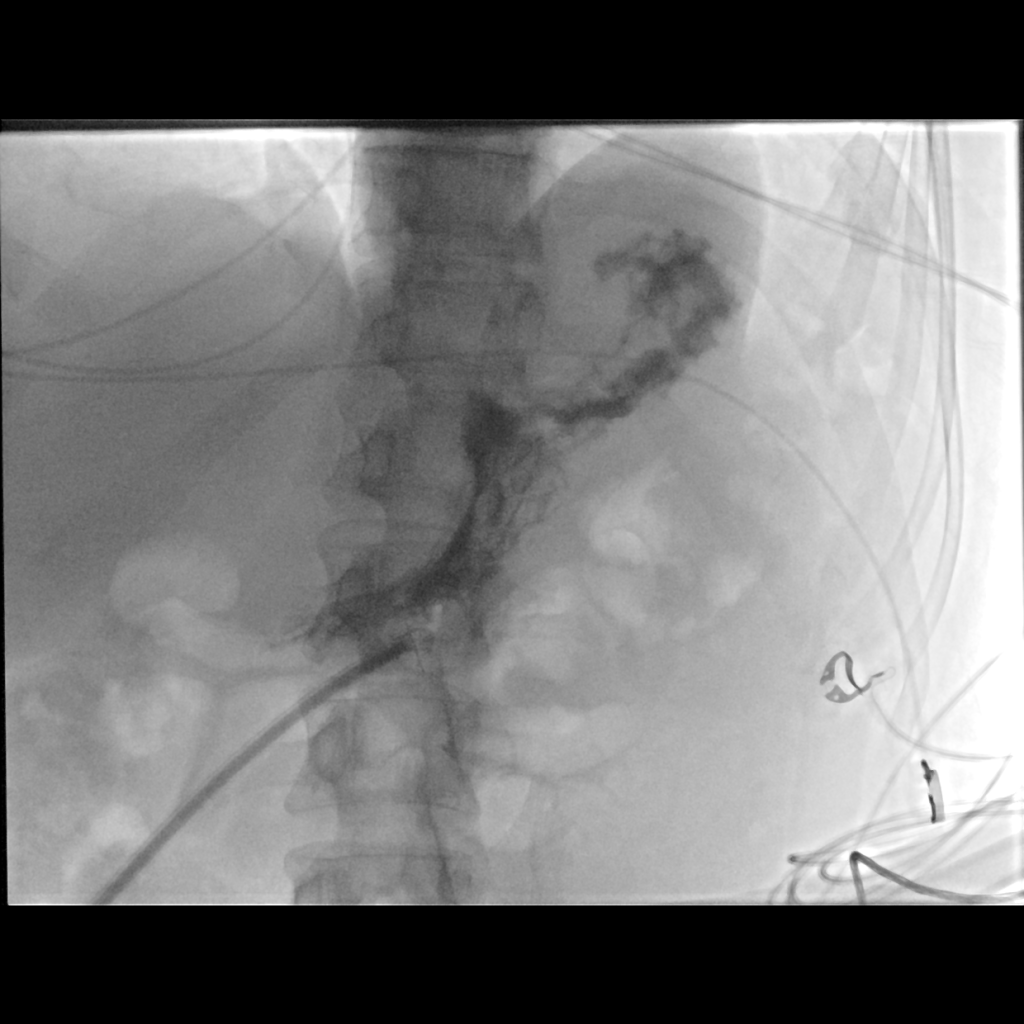
[im 4/4]
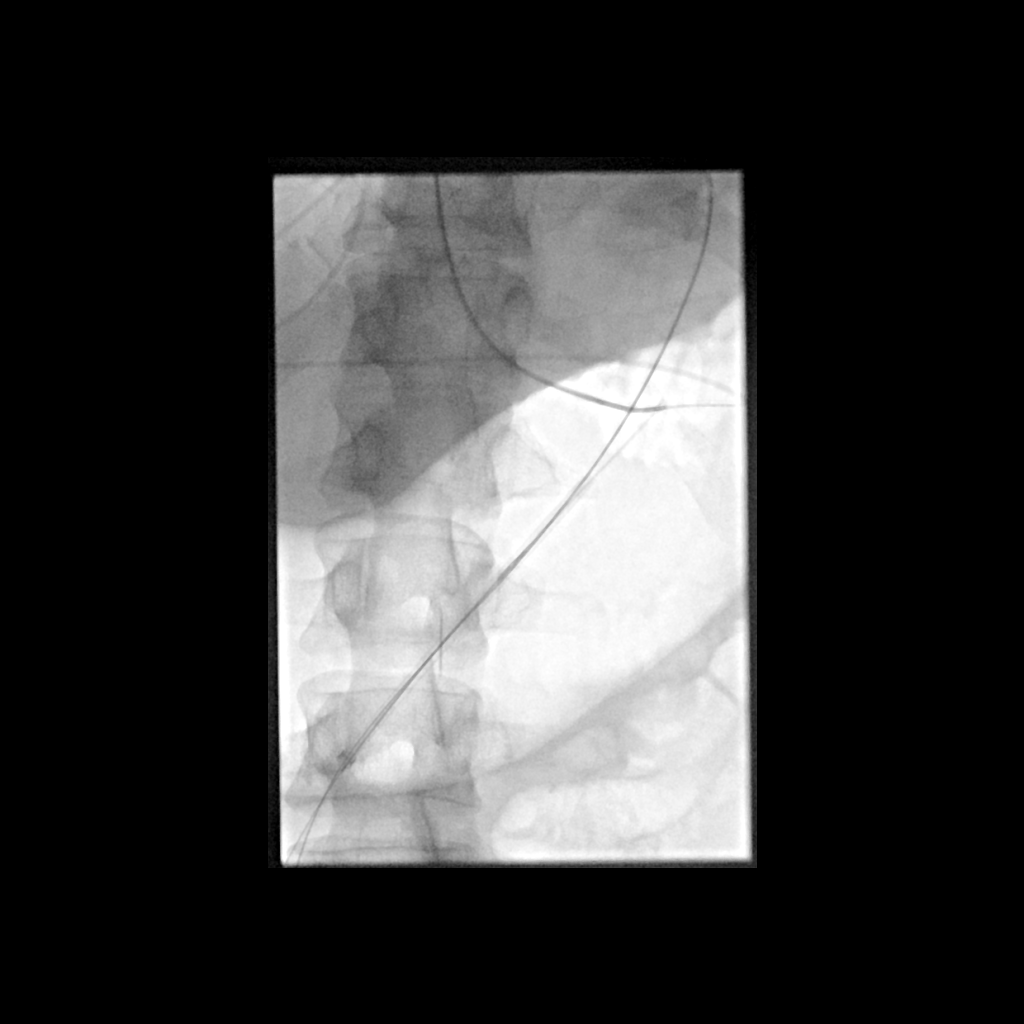

[4 of 4 positions shown; findings below may reference images not displayed]

EXAM:
PERCUTANEOUS GASTROSTOMY TUBE WITH FLUOROSCOPIC GUIDANCE

MEDICATIONS:
Ancef 2 g; Antibiotics were administered within 1 hour of the
procedure. Glucagon 1 mg IV

ANESTHESIA/SEDATION:
Versed 0.5 mg IV; Fentanyl 25 mcg IV

Moderate Sedation Time:  17 minutes

The patient was continuously monitored during the procedure by the
interventional radiology nurse under my direct supervision.

FLUOROSCOPY TIME:  Fluoroscopy Time: 4 minutes 36 seconds (25 mGy).

COMPLICATIONS:
None immediate.

PROCEDURE:
Informed consent was obtained for a percutaneous gastrostomy tube.
The patient was placed on the interventional table. An orogastric
tube was placed with fluoroscopic guidance. The anterior abdomen was
prepped and draped in sterile fashion. Maximal barrier sterile
technique was utilized including caps, mask, sterile gowns, sterile
gloves, sterile drape, hand hygiene and skin antiseptic. Stomach was
inflated with air through the orogastric tube. The skin and
subcutaneous tissues were anesthetized with 1% lidocaine. A 17 gauge
needle was directed into the distended stomach with fluoroscopic
guidance. A wire was advanced into the stomach and Yareli Tena was
deployed. A 9-French vascular sheath was placed and the orogastric
tube was snared using a [employer] snare device. The orogastric tube
and snare were pulled out of the patient's mouth. The snare device
was connected to a 20-French gastrostomy tube. The snare device and
gastrostomy tube were pulled through the patient's mouth and out the
anterior abdominal wall. The gastrostomy tube was cut to an
appropriate length. Contrast injection through gastrostomy tube
confirmed placement within the stomach. Fluoroscopic images were
obtained for documentation. The gastrostomy tube was flushed with
normal saline.
IMPRESSION: Successful fluoroscopic guided percutaneous gastrostomy tube
placement.
